# Patient Record
Sex: Female | Born: 1985 | Race: White | Hispanic: No | Marital: Single | Smoking: Former smoker
Health system: Southern US, Community
[De-identification: ages and names within clinical notes are randomized; demographics above are authoritative.]

## PROBLEM LIST (undated history)

## (undated) ENCOUNTER — Inpatient Hospital Stay (HOSPITAL_COMMUNITY): Payer: Self-pay

## (undated) DIAGNOSIS — K219 Gastro-esophageal reflux disease without esophagitis: Secondary | ICD-10-CM

## (undated) DIAGNOSIS — A749 Chlamydial infection, unspecified: Secondary | ICD-10-CM

## (undated) DIAGNOSIS — F319 Bipolar disorder, unspecified: Secondary | ICD-10-CM

## (undated) DIAGNOSIS — F419 Anxiety disorder, unspecified: Secondary | ICD-10-CM

## (undated) DIAGNOSIS — R519 Headache, unspecified: Secondary | ICD-10-CM

## (undated) DIAGNOSIS — R51 Headache: Secondary | ICD-10-CM

## (undated) HISTORY — DX: Anxiety disorder, unspecified: F41.9

## (undated) HISTORY — PX: TUBAL LIGATION: SHX77

## (undated) HISTORY — DX: Bipolar disorder, unspecified: F31.9

## (undated) HISTORY — PX: APPENDECTOMY: SHX54

## (undated) HISTORY — PX: WISDOM TOOTH EXTRACTION: SHX21

## (undated) HISTORY — PX: CHOLECYSTECTOMY: SHX55

## (undated) HISTORY — DX: Chlamydial infection, unspecified: A74.9

---

## 2005-11-18 ENCOUNTER — Emergency Department: Payer: Self-pay | Admitting: Emergency Medicine

## 2006-07-02 ENCOUNTER — Emergency Department: Payer: Self-pay | Admitting: Emergency Medicine

## 2007-03-27 ENCOUNTER — Emergency Department: Payer: Self-pay | Admitting: Emergency Medicine

## 2007-06-30 ENCOUNTER — Emergency Department: Payer: Self-pay | Admitting: Emergency Medicine

## 2007-08-17 ENCOUNTER — Emergency Department: Payer: Self-pay | Admitting: Emergency Medicine

## 2008-01-23 ENCOUNTER — Emergency Department (HOSPITAL_COMMUNITY): Admission: EM | Admit: 2008-01-23 | Discharge: 2008-01-23 | Payer: Self-pay | Admitting: Emergency Medicine

## 2008-01-24 ENCOUNTER — Emergency Department: Payer: Self-pay | Admitting: Emergency Medicine

## 2008-01-27 ENCOUNTER — Ambulatory Visit: Payer: Self-pay | Admitting: Surgery

## 2008-05-24 ENCOUNTER — Emergency Department: Payer: Self-pay | Admitting: Emergency Medicine

## 2009-01-24 ENCOUNTER — Emergency Department: Payer: Self-pay | Admitting: Emergency Medicine

## 2009-08-17 ENCOUNTER — Ambulatory Visit: Payer: Self-pay | Admitting: Family Medicine

## 2010-09-05 ENCOUNTER — Emergency Department: Payer: Self-pay

## 2010-09-21 ENCOUNTER — Emergency Department (HOSPITAL_COMMUNITY): Admission: EM | Admit: 2010-09-21 | Discharge: 2009-11-24 | Payer: Self-pay | Admitting: Emergency Medicine

## 2011-07-10 LAB — COMPREHENSIVE METABOLIC PANEL
ALT: 22
AST: 24
Albumin: 4
Alkaline Phosphatase: 51
CO2: 25
Calcium: 9.1
Creatinine, Ser: 0.75
GFR calc non Af Amer: >60
Glucose, Bld: 104 — ABNORMAL HIGH
Total Bilirubin: 0.9
Total Protein: 6.7

## 2011-07-10 LAB — URINALYSIS, ROUTINE W REFLEX MICROSCOPIC
Glucose, UA: NEGATIVE
Hgb urine dipstick: NEGATIVE
Protein, ur: NEGATIVE
Specific Gravity, Urine: 1.018
Urobilinogen, UA: 1

## 2011-07-10 LAB — CBC
HCT: 39
Hemoglobin: 13.9
MCV: 87.1
Platelets: 253
RDW: 13
WBC: 8.6

## 2011-07-10 LAB — RAPID URINE DRUG SCREEN, HOSP PERFORMED
Amphetamines: NOT DETECTED
Benzodiazepines: NOT DETECTED
Cocaine: NOT DETECTED
Opiates: NOT DETECTED

## 2011-07-10 LAB — DIFFERENTIAL
Basophils Absolute: 0
Lymphs Abs: 1.8
Monocytes Absolute: 0.6
Neutro Abs: 6.1

## 2012-09-18 ENCOUNTER — Emergency Department: Payer: Self-pay | Admitting: Emergency Medicine

## 2013-06-08 ENCOUNTER — Ambulatory Visit (INDEPENDENT_AMBULATORY_CARE_PROVIDER_SITE_OTHER): Payer: Self-pay | Admitting: *Deleted

## 2013-06-08 ENCOUNTER — Encounter: Payer: Self-pay | Admitting: *Deleted

## 2013-06-08 VITALS — BP 123/88 | Wt 226.0 lb

## 2013-06-08 DIAGNOSIS — Z3481 Encounter for supervision of other normal pregnancy, first trimester: Secondary | ICD-10-CM

## 2013-06-08 DIAGNOSIS — Z3480 Encounter for supervision of other normal pregnancy, unspecified trimester: Secondary | ICD-10-CM

## 2013-06-08 NOTE — Progress Notes (Signed)
[id number] 

## 2013-06-08 NOTE — Progress Notes (Signed)
Informal bedside ultrasound done showing CRL of 7weeks 1 day, intrauterine pregnancy with positive fetal heart rate.  Patient is reassured.

## 2013-06-09 LAB — OBSTETRIC PANEL
Antibody Screen: NEGATIVE
Basophils Absolute: 0 K/uL (ref 0.0–0.1)
Basophils Relative: 0 % (ref 0–1)
Eosinophils Absolute: 0.2 K/uL (ref 0.0–0.7)
Eosinophils Relative: 2 % (ref 0–5)
HCT: 41.1 % (ref 36.0–46.0)
Hemoglobin: 13.8 g/dL (ref 12.0–15.0)
Hepatitis B Surface Ag: NEGATIVE
Lymphocytes Relative: 19 % (ref 12–46)
Lymphs Abs: 2.4 K/uL (ref 0.7–4.0)
MCH: 29.7 pg (ref 26.0–34.0)
MCHC: 33.6 g/dL (ref 30.0–36.0)
MCV: 88.4 fL (ref 78.0–100.0)
Monocytes Absolute: 0.5 K/uL (ref 0.1–1.0)
Monocytes Relative: 4 % (ref 3–12)
Neutro Abs: 9.2 K/uL — ABNORMAL HIGH (ref 1.7–7.7)
Neutrophils Relative %: 75 % (ref 43–77)
Platelets: 251 K/uL (ref 150–400)
RBC: 4.65 MIL/uL (ref 3.87–5.11)
RDW: 13.2 % (ref 11.5–15.5)
Rh Type: POSITIVE
Rubella: 2.32 {index} — ABNORMAL HIGH
WBC: 12.4 K/uL — ABNORMAL HIGH (ref 4.0–10.5)

## 2013-06-09 LAB — GC/CHLAMYDIA PROBE AMP, URINE
Chlamydia, Swab/Urine, PCR: NEGATIVE
GC Probe Amp, Urine: NEGATIVE

## 2013-06-09 LAB — HIV ANTIBODY (ROUTINE TESTING W REFLEX): HIV: NONREACTIVE

## 2013-06-10 LAB — CULTURE, OB URINE: Organism ID, Bacteria: NO GROWTH

## 2013-06-10 LAB — CYSTIC FIBROSIS DIAGNOSTIC STUDY

## 2013-06-25 ENCOUNTER — Encounter: Payer: Self-pay | Admitting: Obstetrics and Gynecology

## 2013-06-25 ENCOUNTER — Ambulatory Visit (INDEPENDENT_AMBULATORY_CARE_PROVIDER_SITE_OTHER): Payer: Self-pay | Admitting: Obstetrics and Gynecology

## 2013-06-25 VITALS — BP 117/87 | Ht 66.0 in | Wt 227.0 lb

## 2013-06-25 DIAGNOSIS — Z124 Encounter for screening for malignant neoplasm of cervix: Secondary | ICD-10-CM

## 2013-06-25 DIAGNOSIS — O99340 Other mental disorders complicating pregnancy, unspecified trimester: Secondary | ICD-10-CM

## 2013-06-25 DIAGNOSIS — O99334 Smoking (tobacco) complicating childbirth: Secondary | ICD-10-CM

## 2013-06-25 DIAGNOSIS — F319 Bipolar disorder, unspecified: Secondary | ICD-10-CM

## 2013-06-25 DIAGNOSIS — O99330 Smoking (tobacco) complicating pregnancy, unspecified trimester: Secondary | ICD-10-CM | POA: Insufficient documentation

## 2013-06-25 DIAGNOSIS — O99331 Smoking (tobacco) complicating pregnancy, first trimester: Secondary | ICD-10-CM

## 2013-06-25 DIAGNOSIS — O3421 Maternal care for scar from previous cesarean delivery: Secondary | ICD-10-CM

## 2013-06-25 DIAGNOSIS — Z3480 Encounter for supervision of other normal pregnancy, unspecified trimester: Secondary | ICD-10-CM | POA: Insufficient documentation

## 2013-06-25 DIAGNOSIS — O34219 Maternal care for unspecified type scar from previous cesarean delivery: Secondary | ICD-10-CM | POA: Insufficient documentation

## 2013-06-25 DIAGNOSIS — Z113 Encounter for screening for infections with a predominantly sexual mode of transmission: Secondary | ICD-10-CM

## 2013-06-25 DIAGNOSIS — Z3481 Encounter for supervision of other normal pregnancy, first trimester: Secondary | ICD-10-CM

## 2013-06-25 MED ORDER — DOCUSATE SODIUM 100 MG PO CAPS: 100.0000 mg | ORAL_CAPSULE | Freq: Two times a day (BID) | ORAL | Status: DC | PRN

## 2013-06-25 MED ORDER — PROMETHAZINE HCL 25 MG PO TABS: 25.0000 mg | ORAL_TABLET | Freq: Four times a day (QID) | ORAL | Status: DC | PRN

## 2013-06-25 NOTE — Patient Instructions (Signed)
Pregnancy - First Trimester During sexual intercourse, millions of sperm go into the vagina. Only 1 sperm will penetrate and fertilize the female egg while it is in the Fallopian tube. One week later, the fertilized egg implants into the wall of the uterus. An embryo begins to develop into a baby. At 6 to 8 weeks, the eyes and face are formed and the heartbeat can be seen on ultrasound. At the end of 12 weeks (first trimester), all the baby's organs are formed. Now that you are pregnant, you will want to do everything you can to have a healthy baby. Two of the most important things are to get good prenatal care and follow your caregiver's instructions. Prenatal care is all the medical care you receive before the baby's birth. It is given to prevent, find, and treat problems during the pregnancy and childbirth. PRENATAL EXAMS  During prenatal visits, your weight, blood pressure, and urine are checked. This is done to make sure you are healthy and progressing normally during the pregnancy.  A pregnant woman should gain 25 to 35 pounds during the pregnancy. However, if you are overweight or underweight, your caregiver will advise you regarding your weight.  Your caregiver will ask and answer questions for you.  Blood work, cervical cultures, other necessary tests, and a Pap test are done during your prenatal exams. These tests are done to check on your health and the probable health of your baby. Tests are strongly recommended and done for HIV with your permission. This is the virus that causes AIDS. These tests are done because medicines can be given to help prevent your baby from being born with this infection should you have been infected without knowing it. Blood work is also used to find out your blood type, previous infections, and follow your blood levels (hemoglobin).  Low hemoglobin (anemia) is common during pregnancy. Iron and vitamins are given to help prevent this. Later in the pregnancy,  blood tests for diabetes will be done along with any other tests if any problems develop.  You may need other tests to make sure you and the baby are doing well. CHANGES DURING THE FIRST TRIMESTER  Your body goes through many changes during pregnancy. They vary from person to person. Talk to your caregiver about changes you notice and are concerned about. Changes can include:  Your menstrual period stops.  The egg and sperm carry the genes that determine what you look like. Genes from you and your partner are forming a baby. The female genes determine whether the baby is a boy or a girl.  Your body increases in girth and you may feel bloated.  Feeling sick to your stomach (nauseous) and throwing up (vomiting). If the vomiting is uncontrollable, call your caregiver.  Your breasts will begin to enlarge and become tender.  Your nipples may stick out more and become darker.  The need to urinate more. Painful urination may mean you have a bladder infection.  Tiring easily.  Loss of appetite.  Cravings for certain kinds of food.  At first, you may gain or lose a couple of pounds.  You may have changes in your emotions from day to day (excited to be pregnant or concerned something may go wrong with the pregnancy and baby).  You may have more vivid and strange dreams. HOME CARE INSTRUCTIONS   It is very important to avoid all smoking, alcohol and non-prescribed drugs during your pregnancy. These affect the formation and growth of the baby.   Avoid chemicals while pregnant to ensure the delivery of a healthy infant.  Start your prenatal visits by the 12th week of pregnancy. They are usually scheduled monthly at first, then more often in the last 2 months before delivery. Keep your caregiver's appointments. Follow your caregiver's instructions regarding medicine use, blood and lab tests, exercise, and diet.  During pregnancy, you are providing food for you and your baby. Eat regular,  well-balanced meals. Choose foods such as meat, fish, milk and other low fat dairy products, vegetables, fruits, and whole-grain breads and cereals. Your caregiver will tell you of the ideal weight gain.  You can help morning sickness by keeping soda crackers at the bedside. Eat a couple before arising in the morning. You may want to use the crackers without salt on them.  Eating 4 to 5 small meals rather than 3 large meals a day also may help the nausea and vomiting.  Drinking liquids between meals instead of during meals also seems to help nausea and vomiting.  A physical sexual relationship may be continued throughout pregnancy if there are no other problems. Problems may be early (premature) leaking of amniotic fluid from the membranes, vaginal bleeding, or belly (abdominal) pain.  Exercise regularly if there are no restrictions. Check with your caregiver or physical therapist if you are unsure of the safety of some of your exercises. Greater weight gain will occur in the last 2 trimesters of pregnancy. Exercising will help:  Control your weight.  Keep you in shape.  Prepare you for labor and delivery.  Help you lose your pregnancy weight after you deliver your baby.  Wear a good support or jogging bra for breast tenderness during pregnancy. This may help if worn during sleep too.  Ask when prenatal classes are available. Begin classes when they are offered.  Do not use hot tubs, steam rooms, or saunas.  Wear your seat belt when driving. This protects you and your baby if you are in an accident.  Avoid raw meat, uncooked cheese, cat litter boxes, and soil used by cats throughout the pregnancy. These carry germs that can cause birth defects in the baby.  The first trimester is a good time to visit your dentist for your dental health. Getting your teeth cleaned is okay. Use a softer toothbrush and brush gently during pregnancy.  Ask for help if you have financial, counseling, or  nutritional needs during pregnancy. Your caregiver will be able to offer counseling for these needs as well as refer you for other special needs.  Do not take any medicines or herbs unless told by your caregiver.  Inform your caregiver if there is any mental or physical domestic violence.  Make a list of emergency phone numbers of family, friends, hospital, and police and fire departments.  Write down your questions. Take them to your prenatal visit.  Do not douche.  Do not cross your legs.  If you have to stand for long periods of time, rotate you feet or take small steps in a circle.  You may have more vaginal secretions that may require a sanitary pad. Do not use tampons or scented sanitary pads. MEDICINES AND DRUG USE IN PREGNANCY  Take prenatal vitamins as directed. The vitamin should contain 1 milligram of folic acid. Keep all vitamins out of reach of children. Only a couple vitamins or tablets containing iron may be fatal to a baby or young child when ingested.  Avoid use of all medicines, including herbs, over-the-counter medicines, not   prescribed or suggested by your caregiver. Only take over-the-counter or prescription medicines for pain, discomfort, or fever as directed by your caregiver. Do not use aspirin, ibuprofen, or naproxen unless directed by your caregiver.  Let your caregiver also know about herbs you may be using.  Alcohol is related to a number of birth defects. This includes fetal alcohol syndrome. All alcohol, in any form, should be avoided completely. Smoking will cause low birth rate and premature babies.  Street or illegal drugs are very harmful to the baby. They are absolutely forbidden. A baby born to an addicted mother will be addicted at birth. The baby will go through the same withdrawal an adult does.  Let your caregiver know about any medicines that you have to take and for what reason you take them. SEEK MEDICAL CARE IF:  You have any concerns or  worries during your pregnancy. It is better to call with your questions if you feel they cannot wait, rather than worry about them. SEEK IMMEDIATE MEDICAL CARE IF:   An unexplained oral temperature above 102 F (38.9 C) develops, or as your caregiver suggests.  You have leaking of fluid from the vagina (birth canal). If leaking membranes are suspected, take your temperature and inform your caregiver of this when you call.  There is vaginal spotting or bleeding. Notify your caregiver of the amount and how many pads are used.  You develop a bad smelling vaginal discharge with a change in the color.  You continue to feel sick to your stomach (nauseated) and have no relief from remedies suggested. You vomit blood or coffee ground-like materials.  You lose more than 2 pounds of weight in 1 week.  You gain more than 2 pounds of weight in 1 week and you notice swelling of your face, hands, feet, or legs.  You gain 5 pounds or more in 1 week (even if you do not have swelling of your hands, face, legs, or feet).  You get exposed to [place] measles and have never had them.  You are exposed to fifth disease or chickenpox.  You develop belly (abdominal) pain. Round ligament discomfort is a common non-cancerous (benign) cause of abdominal pain in pregnancy. Your caregiver still must evaluate this.  You develop headache, fever, diarrhea, pain with urination, or shortness of breath.  You fall or are in a car accident or have any kind of trauma.  There is mental or physical violence in your home. Document Released: 09/25/2001 Document Revised: 06/25/2012 Document Reviewed: 03/29/2009 [facility] Patient Information 2014 [place], [place].  Pregnancy and Smoking Smoking during pregnancy is very unhealthy for the mother and the developing fetus. The addictive drug in cigarettes (nicotine), carbon monoxide, and many other poisons are inhaled from a cigarette and are carried through your bloodstream to your  fetus. Cigarette smoke contains more than 2,500 chemicals. It is not known which of these chemicals are harmful to the developing fetus. However, both nicotine and carbon monoxide play a role in causing health problems in pregnancy. Effects on the fetus of smoking during pregnancy:  Decrease in blood flow and oxygen to the uterus, placenta, and your fetus.  Increased heart rate of the fetus.  Slowing of your fetus's growth in the uterus (intrauterine growth retardation).  Placental problems. Placenta may partially cover or completely cover the opening to the cervix (placenta previa), or the placenta may partially or completely separate from the uterus (placental abruption).  Increase risk of pregnancy outside of the uterus (tubal pregnancy).  Premature rupture membranes, causing the sac that holds the fetus to break too early, resulting in premature birth and increased health risks to the newborn.  Increased risk of birth defects, including heart defects.  Increased risk of miscarriage. Newborns born to women who smoke during pregnancy:  Are more likely to be born too early (prematurely).  Are more likely to be at a low birth weight.  Are at risk for serious health problems, chronic or lifelong disabilities (cerebral palsy, mental retardation, learning problems), and possibly even death  Are at risk of Sudden Infant Death Syndrome (SIDS).  Have higher rates of miscarriage and stillbirth.  Have more lung and breathing (respiratory) problems. Long-term effects on a child's behavior: Some of the following trends are seen with children of smoking mothers:  Increased risk for drug abuse, behavior, and conduct disorders.  Increased risk for smoking in adolescent girls.  Increased risk for negative behavior in 2-year-olds.  Increase risk for asthma, colic, and childhood obesity, which can lead to diabetes.  Increased risk for finger and toe disorders. Resources to stop smoking  during pregnancy:  Counseling.  Psychological treatment.  Acupuncture.  Family intervention.  Hypnosis.  Medicines that are safe to take during pregnancy. Nicotine supplements have not been studied enough. They should only be considered when all other methods fail.  Telephone QUIT lines. Smoking and Breastfeeding:  Nicotine gets passed to the infant through a mother's breastmilk. This can cause nausea, colic, cramping, and diarrhea in the infant.  Smoking may reduce milk supply and interfere with the let-down response.  Even formula-fed infants of mothers who smoke have nicotine and cotinine (nicotine by-product) in their urine. Other resources to help stop smoking:  American Cancer Society: www.cancer.org  American Heart Association: www.americanheart.org  National Cancer Institute: www.cancer.gov  Smoke Free Families: www.smokefreefamilies.[address] [place]): 601-771-8631 START Document Released: 02/12/2005 Document Revised: 12/24/2011 Document Reviewed: 07/13/2009 [facility] Patient Information 2014 [place], [place].  Contraception Choices Contraception (birth control) is the use of any methods or devices to prevent pregnancy. Below are some methods to help avoid pregnancy. HORMONAL METHODS   Contraceptive implant. This is a thin, plastic tube containing progesterone hormone. It does not contain estrogen hormone. Your caregiver inserts the tube in the inner part of the upper arm. The tube can remain in place for up to 3 years. After 3 years, the implant must be removed. The implant prevents the ovaries from releasing an egg (ovulation), thickens the cervical mucus which prevents sperm from entering the uterus, and thins the lining of the inside of the uterus.  Progesterone-only injections. These injections are given every 3 months by your caregiver to prevent pregnancy. This synthetic progesterone hormone stops the ovaries from releasing eggs. It also thickens cervical  mucus and changes the uterine lining. This makes it harder for sperm to survive in the uterus.  Birth control pills. These pills contain estrogen and progesterone hormone. They work by stopping the egg from forming in the ovary (ovulation). Birth control pills are prescribed by a caregiver.Birth control pills can also be used to treat heavy periods.  Minipill. This type of birth control pill contains only the progesterone hormone. They are taken every day of each month and must be prescribed by your caregiver.  Birth control patch. The patch contains hormones similar to those in birth control pills. It must be changed once a week and is prescribed by a caregiver.  Vaginal ring. The ring contains hormones similar to those in birth control  pills. It is left in the vagina for 3 weeks, removed for 1 week, and then a new one is put back in place. The patient must be comfortable inserting and removing the ring from the vagina.A caregiver's prescription is necessary.  Emergency contraception. Emergency contraceptives prevent pregnancy after unprotected sexual intercourse. This pill can be taken right after sex or up to 5 days after unprotected sex. It is most effective the sooner you take the pills after having sexual intercourse. Emergency contraceptive pills are available without a prescription. Check with your pharmacist. Do not use emergency contraception as your only form of birth control. BARRIER METHODS   Female condom. This is a thin sheath (latex or rubber) that is worn over the penis during sexual intercourse. It can be used with spermicide to increase effectiveness.  Female condom. This is a soft, loose-fitting sheath that is put into the vagina before sexual intercourse.  Diaphragm. This is a soft, latex, dome-shaped barrier that must be fitted by a caregiver. It is inserted into the vagina, along with a spermicidal jelly. It is inserted before intercourse. The diaphragm should be left in the  vagina for 6 to 8 hours after intercourse.  Cervical cap. This is a round, soft, latex or plastic cup that fits over the cervix and must be fitted by a caregiver. The cap can be left in place for up to 48 hours after intercourse.  Sponge. This is a soft, circular piece of polyurethane foam. The sponge has spermicide in it. It is inserted into the vagina after wetting it and before sexual intercourse.  Spermicides. These are chemicals that kill or block sperm from entering the cervix and uterus. They come in the form of creams, jellies, suppositories, foam, or tablets. They do not require a prescription. They are inserted into the vagina with an applicator before having sexual intercourse. The process must be repeated every time you have sexual intercourse. INTRAUTERINE CONTRACEPTION  Intrauterine device (IUD). This is a T-shaped device that is put in a woman's uterus during a menstrual period to prevent pregnancy. There are 2 types:  Copper IUD. This type of IUD is wrapped in copper wire and is placed inside the uterus. Copper makes the uterus and fallopian tubes produce a fluid that kills sperm. It can stay in place for 10 years.  Hormone IUD. This type of IUD contains the hormone progestin (synthetic progesterone). The hormone thickens the cervical mucus and prevents sperm from entering the uterus, and it also thins the uterine lining to prevent implantation of a fertilized egg. The hormone can weaken or kill the sperm that get into the uterus. It can stay in place for 5 years. PERMANENT METHODS OF CONTRACEPTION  Female tubal ligation. This is when the woman's fallopian tubes are surgically sealed, tied, or blocked to prevent the egg from traveling to the uterus.  Female sterilization. This is when the female has the tubes that carry sperm tied off (vasectomy).This blocks sperm from entering the vagina during sexual intercourse. After the procedure, the man can still ejaculate fluid  (semen). NATURAL PLANNING METHODS  Natural family planning. This is not having sexual intercourse or using a barrier method (condom, diaphragm, cervical cap) on days the woman could become pregnant.  Calendar method. This is keeping track of the length of each menstrual cycle and identifying when you are fertile.  Ovulation method. This is avoiding sexual intercourse during ovulation.  Symptothermal method. This is avoiding sexual intercourse during ovulation, using a thermometer and ovulation  symptoms.  Post-ovulation method. This is timing sexual intercourse after you have ovulated. Regardless of which type or method of contraception you choose, it is important that you use condoms to protect against the transmission of sexually transmitted diseases (STDs). Talk with your caregiver about which form of contraception is most appropriate for you. Document Released: 10/01/2005 Document Revised: 12/24/2011 Document Reviewed: 02/07/2011 [facility] Patient Information 2014 [place], [place].  Breastfeeding A change in hormones during your pregnancy causes growth of your breast tissue and an increase in number and size of milk ducts. The hormone prolactin allows proteins, sugars, and fats from your blood supply to make breast milk in your milk-producing glands. The hormone progesterone prevents breast milk from being released before the birth of your baby. After the birth of your baby, your progesterone level decreases allowing breast milk to be released. Thoughts of your baby, as well as his or her sucking or crying, can stimulate the release of milk from the milk-producing glands. Deciding to breastfeed (nurse) is one of the best choices you can make for you and your baby. The information that follows gives a brief review of the benefits, as well as other important skills to know about breastfeeding. BENEFITS OF BREASTFEEDING For your baby  The first milk (colostrum) helps your baby's digestive system  function better.   There are antibodies in your milk that help your baby fight off infections.   Your baby has a lower incidence of asthma, allergies, and sudden infant death syndrome (SIDS).   The nutrients in breast milk are better for your baby than infant formulas.  Breast milk improves your baby's brain development.   Your baby will have less gas, colic, and constipation.  Your baby is less likely to develop other conditions, such as childhood obesity, asthma, or diabetes mellitus. For you  Breastfeeding helps develop a very special bond between you and your baby.   Breastfeeding is convenient, always available at the correct temperature, and costs nothing.   Breastfeeding helps to burn calories and helps you lose the weight gained during pregnancy.   Breastfeeding makes your uterus contract back down to normal size faster and slows bleeding following delivery.   Breastfeeding mothers have a lower risk of developing osteoporosis or breast or ovarian cancer later in life.  BREASTFEEDING FREQUENCY  A healthy, full-term baby may breastfeed as often as every hour or space his or her feedings to every 3 hours. Breastfeeding frequency will vary from baby to baby.   Newborns should be fed no less than every 2 3 hours during the day and every 4 5 hours during the night. You should breastfeed a minimum of 8 feedings in a 24 hour period.  Awaken your baby to breastfeed if it has been 3 4 hours since the last feeding.  Breastfeed when you feel the need to reduce the fullness of your breasts or when your newborn shows signs of hunger. Signs that your baby may be hungry include:  Increased alertness or activity.  Stretching.  Movement of the head from side to side.  Movement of the head and opening of the mouth when the corner of the mouth or cheek is stroked (rooting).  Increased sucking sounds, smacking lips, cooing, sighing, or squeaking.  Hand-to-mouth  movements.  Increased sucking of fingers or hands.  Fussing.  Intermittent crying.  Signs of extreme hunger will require calming and consoling before you try to feed your baby. Signs of extreme hunger may include:  Restlessness.  A loud,  strong cry.  Screaming.  Frequent feeding will help you make more milk and will help prevent problems, such as sore nipples and engorgement of the breasts.  BREASTFEEDING   Whether lying down or sitting, be sure that the baby's abdomen is facing your abdomen.   Support your breast with 4 fingers under your breast and your thumb above your nipple. Make sure your fingers are well away from your nipple and your baby's mouth.   Stroke your baby's lips gently with your finger or nipple.   When your baby's mouth is open wide enough, place all of your nipple and as much of the colored area around your nipple (areola) as possible into your baby's mouth.  More areola should be visible above his or her upper lip than below his or her lower lip.  Your baby's tongue should be between his or her lower gum and your breast.  Ensure that your baby's mouth is correctly positioned around the nipple (latched). Your baby's lips should create a seal on your breast.  Signs that your baby has effectively latched onto your nipple include:  Tugging or sucking without pain.  Swallowing heard between sucks.  Absent click or smacking sound.  Muscle movement above and in front of his or her ears with sucking.  Your baby must suck about 2 3 minutes in order to get your milk. Allow your baby to feed on each breast as long as he or she wants. Nurse your baby until he or she unlatches or falls asleep at the first breast, then offer the second breast.  Signs that your baby is full and satisfied include:  A gradual decrease in the number of sucks or complete cessation of sucking.  Falling asleep.  Extension or relaxation of his or her body.  Retention of a  small amount of milk in his or her mouth.  Letting go of your breast by himself or herself.  Signs of effective breastfeeding in you include:  Breasts that have increased firmness, weight, and size prior to feeding.  Breasts that are softer after nursing.  Increased milk volume, as well as a change in milk consistency and color by the 5th day of breastfeeding.  Breast fullness relieved by breastfeeding.  Nipples are not sore, cracked, or bleeding.  If needed, break the suction by putting your finger into the corner of your baby's mouth and sliding your finger between his or her gums. Then, remove your breast from his or her mouth.  It is common for babies to spit up a small amount after a feeding.  Babies often swallow air during feeding. This can make babies fussy. Burping your baby between breasts can help with this.  Vitamin D supplements are recommended for babies who get only breast milk.  Avoid using a pacifier during your baby's first 4 6 weeks.  Avoid supplemental feedings of water, formula, or juice in place of breastfeeding. Breast milk is all the food your baby needs. It is not necessary for your baby to have water or formula. Your breasts will make more milk if supplemental feedings are avoided during the early weeks. HOW TO TELL WHETHER YOUR BABY IS GETTING ENOUGH BREAST MILK Wondering whether or not your baby is getting enough milk is a common concern among mothers. You can be assured that your baby is getting enough milk if:   Your baby is actively sucking and you hear swallowing.   Your baby seems relaxed and satisfied after a feeding.  Your baby nurses at least 8 12 times in a 24 hour time period.  During the first 20 25 days of age:  Your baby is wetting at least 3 5 diapers in a 24 hour period. The urine should be clear and pale yellow.  Your baby is having at least 3 4 stools in a 24 hour period. The stool should be soft and yellow.  At 56 84 days of age,  your baby is having at least 3 6 stools in a 24 hour period. The stool should be seedy and yellow by 40 days of age.  Your baby has a weight loss less than 7 10% during the first 51 days of age.  Your baby does not lose weight after 67 67 days of age.  Your baby gains 4 7 ounces each week after he or she is 38 days of age.  Your baby gains weight by 69 days of age and is back to birth weight within 2 weeks. ENGORGEMENT In the first week after your baby is born, you may experience extremely full breasts (engorgement). When engorged, your breasts may feel heavy, warm, or tender to the touch. Engorgement peaks within 24 48 hours after delivery of your baby.  Engorgement may be reduced by:  Continuing to breastfeed.  Increasing the frequency of breastfeeding.  Taking warm showers or applying warm, moist heat to your breasts just before each feeding. This increases circulation and helps the milk flow.   Gently massaging your breast before and during the feedings. With your fingertips, massage from your chest wall towards your nipple in a circular motion.   Ensuring that your baby empties at least one breast at every feeding. It also helps to start the next feeding on the opposite breast.   Expressing breast milk by hand or by using a breast pump to empty the breasts if your baby is sleepy, or not nursing well. You may also want to express milk if you are returning to work oryou feel you are getting engorged.  Ensuring your baby is latched on and positioned properly while breastfeeding. If you follow these suggestions, your engorgement should improve in 24 48 hours. If you are still experiencing difficulty, call your lactation consultant or caregiver.  CARING FOR YOURSELF Take care of your breasts.  Bathe or shower daily.   Avoid using soap on your nipples.   Wear a supportive bra. Avoid wearing underwire style bras.  Air dry your nipples for a 3 after each feeding.   Use  only cotton bra pads to absorb breast milk leakage. Leaking of breast milk between feedings is normal.   Use only pure lanolin on your nipples after nursing. You do not need to wash it off before feeding your baby again. Another option is to express a few drops of breast milk and gently massage that milk into your nipples.  Continue breast self-awareness checks. Take care of yourself.  Eat healthy foods. Alternate 3 meals with 3 snacks.  Avoid foods that you notice affect your baby in a bad way.  Drink milk, fruit juice, and water to satisfy your thirst (about 8 glasses a day).   Rest often, relax, and take your prenatal vitamins to prevent fatigue, stress, and anemia.  Avoid chewing and smoking tobacco.  Avoid alcohol and drug use.  Take over-the-counter and prescribed medicine only as directed by your caregiver or pharmacist. You should always check with your caregiver or pharmacist before taking any new medicine, vitamin, or  herbal supplement.  Know that pregnancy is possible while breastfeeding. If desired, talk to your caregiver about family planning and safe birth control methods that may be used while breastfeeding. SEEK MEDICAL CARE IF:   You feel like you want to stop breastfeeding or have become frustrated with breastfeeding.  You have painful breasts or nipples.  Your nipples are cracked or bleeding.  Your breasts are red, tender, or warm.  You have a swollen area on either breast.  You have a fever or chills.  You have nausea or vomiting.  You have drainage from your nipples.  Your breasts do not become full before feedings by the 5th day after delivery.  You feel sad and depressed.  Your baby is too sleepy to eat well.  Your baby is having trouble sleeping.   Your baby is wetting less than 3 diapers in a 24 hour period.  Your baby has less than 3 stools in a 24 hour period.  Your baby's skin or the white part of his or her eyes becomes more yellow.    Your baby is not gaining weight by 62 days of age. MAKE SURE YOU:   Understand these instructions.  Will watch your condition.  Will get help right away if you are not doing well or get worse. Document Released: 10/01/2005 Document Revised: 06/25/2012 Document Reviewed: 05/07/2012 [facility] Patient Information 2014 [place], [place].

## 2013-06-25 NOTE — Progress Notes (Signed)
   Subjective:    Joan Heath is a G2P1001 [redacted]w[redacted]d being seen today for her first obstetrical visit.  Her obstetrical history is significant for smoker and bipolar disorder and previous cesarean section. Patient does intend to breast feed. Pregnancy history fully reviewed.  Patient reports no complaints.  Filed Vitals:   06/25/13 0908  BP: 117/87  Weight: 227 lb (102.967 kg)    HISTORY: OB History  Gravida Para Term Preterm AB SAB TAB Ectopic Multiple Living  2 1 1       1     # Outcome Date GA Lbr Len/2nd Weight Sex Delivery Anes PTL Lv  2 CUR           1 TRM              Past Medical History  Diagnosis Date  . Anxiety   . Bipolar 1 disorder    Past Surgical History  Procedure Laterality Date  . Cesarean section  2007  . Appendectomy    . Cholecystectomy     No family history on file.   Exam    Uterus:     Pelvic Exam:    Perineum: Normal Perineum   Vulva: normal   Vagina:  normal mucosa, normal discharge   pH:    Cervix: closed and long   Adnexa: no mass, fullness, tenderness   Bony Pelvis: android  System: Breast:  normal appearance, no masses or tenderness   Skin: normal coloration and turgor, no rashes    Neurologic: oriented, no focal deficits   Extremities: normal strength, tone, and muscle mass   HEENT extra ocular movement intact   Mouth/Teeth mucous membranes moist, pharynx normal without lesions   Neck supple and no masses   Cardiovascular: regular rate and rhythm   Respiratory:  chest clear, no wheezing, crepitations, rhonchi, normal symmetric air entry   Abdomen: soft, non-tender; bowel sounds normal; no masses,  no organomegaly   Urinary:       Assessment:    Pregnancy: G2P1001 Patient Active Problem List   Diagnosis Date Noted  . Supervision of other normal pregnancy 06/25/2013  . Tobacco smoking complicating pregnancy 06/25/2013  . Bipolar disease in pregnancy 06/25/2013  . Bipolar disorder 06/25/2013        Plan:      Initial labs drawn. Prenatal vitamins. Problem list reviewed and updated. Genetic Screening discussed First Screen: requested.  Ultrasound discussed; fetal survey: requested. Discussed effects of smoking in pregnancy. Encouraged smoking cessation. Information provided to help quit Patient not taking medications for anxiety or bipolar disorder. Admits that her mood swings are out of control but does not like the way the medications makes her feel. Will refer to psychologist for support.  Follow up in 4 weeks. 50% of 30 min visit spent on counseling and coordination of care.     Tarence Searcy 06/25/2013

## 2013-06-25 NOTE — Progress Notes (Signed)
[id number]  

## 2013-07-08 ENCOUNTER — Telehealth: Payer: Self-pay | Admitting: *Deleted

## 2013-07-08 DIAGNOSIS — O219 Vomiting of pregnancy, unspecified: Secondary | ICD-10-CM

## 2013-07-08 MED ORDER — PROMETHAZINE HCL 25 MG PO TABS: 25.0000 mg | ORAL_TABLET | Freq: Four times a day (QID) | ORAL | Status: DC | PRN

## 2013-07-08 NOTE — Telephone Encounter (Signed)
Patient needs phenergan called into [facility] [address] for price reasons due to her insurance is not in effect yet.

## 2013-07-15 ENCOUNTER — Emergency Department: Payer: Self-pay | Admitting: Emergency Medicine

## 2013-07-15 LAB — URINALYSIS, COMPLETE
Bilirubin,UR: NEGATIVE
Nitrite: NEGATIVE
Specific Gravity: 1.026 (ref 1.003–1.030)
Squamous Epithelial: 6
WBC UR: 4 /HPF (ref 0–5)

## 2013-07-15 LAB — COMPREHENSIVE METABOLIC PANEL
Alkaline Phosphatase: 55 U/L (ref 50–136)
Anion Gap: 9 (ref 7–16)
BUN: 6 mg/dL — ABNORMAL LOW (ref 7–18)
Bilirubin,Total: 0.4 mg/dL (ref 0.2–1.0)
Calcium, Total: 9.2 mg/dL (ref 8.5–10.1)
Co2: 23 mmol/L (ref 21–32)
Glucose: 95 mg/dL (ref 65–99)
Osmolality: 268 (ref 275–301)
Sodium: 135 mmol/L — ABNORMAL LOW (ref 136–145)
Total Protein: 7.6 g/dL (ref 6.4–8.2)

## 2013-07-15 LAB — CBC
HCT: 39.1 % (ref 35.0–47.0)
MCH: 29.9 pg (ref 26.0–34.0)
MCV: 86 fL (ref 80–100)
Platelet: 242 10*3/uL (ref 150–440)
RBC: 4.54 10*6/uL (ref 3.80–5.20)

## 2013-07-15 LAB — HCG, QUANTITATIVE, PREGNANCY: Beta Hcg, Quant.: 43138 m[IU]/mL — ABNORMAL HIGH

## 2013-07-15 LAB — WET PREP, GENITAL

## 2013-07-15 LAB — GC/CHLAMYDIA PROBE AMP

## 2013-07-17 ENCOUNTER — Encounter: Payer: Self-pay | Admitting: Family Medicine

## 2013-07-17 ENCOUNTER — Ambulatory Visit (INDEPENDENT_AMBULATORY_CARE_PROVIDER_SITE_OTHER): Payer: Self-pay | Admitting: Family Medicine

## 2013-07-17 VITALS — BP 115/74 | Wt 224.0 lb

## 2013-07-17 DIAGNOSIS — Z3480 Encounter for supervision of other normal pregnancy, unspecified trimester: Secondary | ICD-10-CM

## 2013-07-17 DIAGNOSIS — Z3483 Encounter for supervision of other normal pregnancy, third trimester: Secondary | ICD-10-CM

## 2013-07-17 NOTE — Progress Notes (Signed)
Discussed meds with pt.  Previously on Geodon, Lamictal. Advised to see psychiatry. Needs to reschedule 1st trimester screen.

## 2013-07-17 NOTE — Patient Instructions (Addendum)
Morning Sickness Morning sickness is when you feel sick to your stomach (nauseous) during pregnancy. This nauseous feeling may or may not come with throwing up (vomiting). It often occurs in the morning, but can be a problem any time of day. While morning sickness is unpleasant, it is usually harmless unless you develop severe and continual vomiting (hyperemesis gravidarum). This condition requires more intense treatment. CAUSES  The cause of morning sickness is not completely known but seems to be related to a sudden increase of two hormones:   Human chorionic gonadotropin (hCG).  Estrogen hormone. These are elevated in the first part of the pregnancy. TREATMENT  Do not use any medicines (prescription, over-the-counter, or herbal) for morning sickness without first talking to your caregiver. Some patients are helped by the following:  Vitamin B6 (25mg  every 8 hours) or vitamin B6 shots.  An antihistamine called doxylamine (10mg  every 8 hours).  The herbal medication ginger. HOME CARE INSTRUCTIONS   Taking multivitamins before getting pregnant can prevent or decrease the severity of morning sickness in most women.  Eat a piece of dry toast or unsalted crackers before getting out of bed in the morning.  Eat 5 or 6 small meals a day.  Eat dry and bland foods (rice, baked potato).  Do not drink liquids with your meals. Drink liquids between meals.  Avoid greasy, fatty, and spicy foods.  Get someone to cook for you if the smell of any food causes nausea and vomiting.  Avoid vitamin pills with iron because iron can cause nausea.  Snack on protein foods between meals if you are hungry.  Eat unsweetened gelatins for deserts.  Wear an acupressure wristband (worn for sea sickness) may be helpful.  Acupuncture may be helpful.  Do not smoke.  Get a humidifier to keep the air in your house free of odors. SEEK MEDICAL CARE IF:   Your home remedies are not working and you need  medication.  You feel dizzy or lightheaded.  You are losing weight.  You need help with your diet. SEEK IMMEDIATE MEDICAL CARE IF:   You have persistent and uncontrolled nausea and vomiting.  You pass out (faint).  You have a fever. MAKE SURE YOU:   Understand these instructions.  Will watch your condition.  Will get help right away if you are not doing well or get worse. Document Released: 11/22/2006 Document Revised: 12/24/2011 Document Reviewed: 09/19/2007 [facility] Patient Information 2014 [place], [place].  Pregnancy - Second Trimester The second trimester of pregnancy (3 to 6 months) is a period of rapid growth for you and your baby. At the end of the sixth month, your baby is about 9 inches long and weighs 1 1/2 pounds. You will begin to feel the baby move between 18 and 20 weeks of the pregnancy. This is called quickening. Weight gain is faster. A clear fluid (colostrum) may leak out of your breasts. You may feel small contractions of the womb (uterus). This is known as false labor or Braxton-Hicks contractions. This is like a practice for labor when the baby is ready to be born. Usually, the problems with morning sickness have usually passed by the end of your first trimester. Some women develop small dark blotches (called cholasma, mask of pregnancy) on their face that usually goes away after the baby is born. Exposure to the sun makes the blotches worse. Acne may also develop in some pregnant women and pregnant women who have acne, may find that it goes away. PRENATAL EXAMS  Blood work may continue to be done during prenatal exams. These tests are done to check on your health and the probable health of your baby. Blood work is used to follow your blood levels (hemoglobin). Anemia (low hemoglobin) is common during pregnancy. Iron and vitamins are given to help prevent this. You will also be checked for diabetes between 24 and 28 weeks of the pregnancy. Some of the previous blood  tests may be repeated.  The size of the uterus is measured during each visit. This is to make sure that the baby is continuing to grow properly according to the dates of the pregnancy.  Your blood pressure is checked every prenatal visit. This is to make sure you are not getting toxemia.  Your urine is checked to make sure you do not have an infection, diabetes or protein in the urine.  Your weight is checked often to make sure gains are happening at the suggested rate. This is to ensure that both you and your baby are growing normally.  Sometimes, an ultrasound is performed to confirm the proper growth and development of the baby. This is a test which bounces harmless sound waves off the baby so your caregiver can more accurately determine due dates. Sometimes, a test is done on the amniotic fluid surrounding the baby. This test is called an amniocentesis. The amniotic fluid is obtained by sticking a needle into the belly (abdomen). This is done to check the chromosomes in instances where there is a concern about possible genetic problems with the baby. It is also sometimes done near the end of pregnancy if an early delivery is required. In this case, it is done to help make sure the baby's lungs are mature enough for the baby to live outside of the womb. CHANGES OCCURING IN THE SECOND TRIMESTER OF PREGNANCY Your body goes through many changes during pregnancy. They vary from person to person. Talk to your caregiver about changes you notice that you are concerned about.  During the second trimester, you will likely have an increase in your appetite. It is normal to have cravings for certain foods. This varies from person to person and pregnancy to pregnancy.  Your lower abdomen will begin to bulge.  You may have to urinate more often because the uterus and baby are pressing on your bladder. It is also common to get more bladder infections during pregnancy. You can help this by drinking lots of  fluids and emptying your bladder before and after intercourse.  You may begin to get stretch marks on your hips, abdomen, and breasts. These are normal changes in the body during pregnancy. There are no exercises or medicines to take that prevent this change.  You may begin to develop swollen and bulging veins (varicose veins) in your legs. Wearing support hose, elevating your feet for 15 minutes, 3 to 4 times a day and limiting salt in your diet helps lessen the problem.  Heartburn may develop as the uterus grows and pushes up against the stomach. Antacids recommended by your caregiver helps with this problem. Also, eating smaller meals 4 to 5 times a day helps.  Constipation can be treated with a stool softener or adding bulk to your diet. Drinking lots of fluids, and eating vegetables, fruits, and whole grains are helpful.  Exercising is also helpful. If you have been very active up until your pregnancy, most of these activities can be continued during your pregnancy. If you have been less active, it is helpful  to start an exercise program such as walking.  Hemorrhoids may develop at the end of the second trimester. Warm sitz baths and hemorrhoid cream recommended by your caregiver helps hemorrhoid problems.  Backaches may develop during this time of your pregnancy. Avoid heavy lifting, wear low heal shoes, and practice good posture to help with backache problems.  Some pregnant women develop tingling and numbness of their hand and fingers because of swelling and tightening of ligaments in the wrist (carpel tunnel syndrome). This goes away after the baby is born.  As your breasts enlarge, you may have to get a bigger bra. Get a comfortable, cotton, support bra. Do not get a nursing bra until the last month of the pregnancy if you will be nursing the baby.  You may get a dark line from your belly button to the pubic area called the linea nigra.  You may develop rosy cheeks because of increase  blood flow to the face.  You may develop spider looking lines of the face, neck, arms, and chest. These go away after the baby is born. HOME CARE INSTRUCTIONS   It is extremely important to avoid all smoking, herbs, alcohol, and unprescribed drugs during your pregnancy. These chemicals affect the formation and growth of the baby. Avoid these chemicals throughout the pregnancy to ensure the delivery of a healthy infant.  Most of your home care instructions are the same as suggested for the first trimester of your pregnancy. Keep your caregiver's appointments. Follow your caregiver's instructions regarding medicine use, exercise, and diet.  During pregnancy, you are providing food for you and your baby. Continue to eat regular, well-balanced meals. Choose foods such as meat, fish, milk and other low fat dairy products, vegetables, fruits, and whole-grain breads and cereals. Your caregiver will tell you of the ideal weight gain.  A physical sexual relationship may be continued up until near the end of pregnancy if there are no other problems. Problems could include early (premature) leaking of amniotic fluid from the membranes, vaginal bleeding, abdominal pain, or other medical or pregnancy problems.  Exercise regularly if there are no restrictions. Check with your caregiver if you are unsure of the safety of some of your exercises. The greatest weight gain will occur in the last 2 trimesters of pregnancy. Exercise will help you:  Control your weight.  Get you in shape for labor and delivery.  Lose weight after you have the baby.  Wear a good support or jogging bra for breast tenderness during pregnancy. This may help if worn during sleep. Pads or tissues may be used in the bra if you are leaking colostrum.  Do not use hot tubs, steam rooms or saunas throughout the pregnancy.  Wear your seat belt at all times when driving. This protects you and your baby if you are in an accident.  Avoid raw  meat, uncooked cheese, cat litter boxes, and soil used by cats. These carry germs that can cause birth defects in the baby.  The second trimester is also a good time to visit your dentist for your dental health if this has not been done yet. Getting your teeth cleaned is okay. Use a soft toothbrush. Brush gently during pregnancy.  It is easier to leak urine during pregnancy. Tightening up and strengthening the pelvic muscles will help with this problem. Practice stopping your urination while you are going to the bathroom. These are the same muscles you need to strengthen. It is also the muscles you would use  as if you were trying to stop from passing gas. You can practice tightening these muscles up 10 times a set and repeating this about 3 times per day. Once you know what muscles to tighten up, do not perform these exercises during urination. It is more likely to contribute to an infection by backing up the urine.  Ask for help if you have financial, counseling, or nutritional needs during pregnancy. Your caregiver will be able to offer counseling for these needs as well as refer you for other special needs.  Your skin may become oily. If so, wash your face with mild soap, use non-greasy moisturizer and oil or cream based makeup. MEDICINES AND DRUG USE IN PREGNANCY  Take prenatal vitamins as directed. The vitamin should contain 1 milligram of folic acid. Keep all vitamins out of reach of children. Only a couple vitamins or tablets containing iron may be fatal to a baby or young child when ingested.  Avoid use of all medicines, including herbs, over-the-counter medicines, not prescribed or suggested by your caregiver. Only take over-the-counter or prescription medicines for pain, discomfort, or fever as directed by your caregiver. Do not use aspirin.  Let your caregiver also know about herbs you may be using.  Alcohol is related to a number of birth defects. This includes fetal alcohol syndrome.  All alcohol, in any form, should be avoided completely. Smoking will cause low birth rate and premature babies.  Street or illegal drugs are very harmful to the baby. They are absolutely forbidden. A baby born to an addicted mother will be addicted at birth. The baby will go through the same withdrawal an adult does. SEEK MEDICAL CARE IF:  You have any concerns or worries during your pregnancy. It is better to call with your questions if you feel they cannot wait, rather than worry about them. SEEK IMMEDIATE MEDICAL CARE IF:   An unexplained oral temperature above 102 F (38.9 C) develops, or as your caregiver suggests.  You have leaking of fluid from the vagina (birth canal). If leaking membranes are suspected, take your temperature and tell your caregiver of this when you call.  There is vaginal spotting, bleeding, or passing clots. Tell your caregiver of the amount and how many pads are used. Light spotting in pregnancy is common, especially following intercourse.  You develop a bad smelling vaginal discharge with a change in the color from clear to white.  You continue to feel sick to your stomach (nauseated) and have no relief from remedies suggested. You vomit blood or coffee ground-like materials.  You lose more than 2 pounds of weight or gain more than 2 pounds of weight over 1 week, or as suggested by your caregiver.  You notice swelling of your face, hands, feet, or legs.  You get exposed to [place] measles and have never had them.  You are exposed to fifth disease or chickenpox.  You develop belly (abdominal) pain. Round ligament discomfort is a common non-cancerous (benign) cause of abdominal pain in pregnancy. Your caregiver still must evaluate you.  You develop a bad headache that does not go away.  You develop fever, diarrhea, pain with urination, or shortness of breath.  You develop visual problems, blurry, or double vision.  You fall or are in a car accident or any  kind of trauma.  There is mental or physical violence at home. Document Released: 09/25/2001 Document Revised: 06/25/2012 Document Reviewed: 03/30/2009 [facility] Patient Information 2014 [place], [place].  Breastfeeding A change in hormones  during your pregnancy causes growth of your breast tissue and an increase in number and size of milk ducts. The hormone prolactin allows proteins, sugars, and fats from your blood supply to make breast milk in your milk-producing glands. The hormone progesterone prevents breast milk from being released before the birth of your baby. After the birth of your baby, your progesterone level decreases allowing breast milk to be released. Thoughts of your baby, as well as his or her sucking or crying, can stimulate the release of milk from the milk-producing glands. Deciding to breastfeed (nurse) is one of the best choices you can make for you and your baby. The information that follows gives a brief review of the benefits, as well as other important skills to know about breastfeeding. BENEFITS OF BREASTFEEDING For your baby  The first milk (colostrum) helps your baby's digestive system function better.   There are antibodies in your milk that help your baby fight off infections.   Your baby has a lower incidence of asthma, allergies, and sudden infant death syndrome (SIDS).   The nutrients in breast milk are better for your baby than infant formulas.  Breast milk improves your baby's brain development.   Your baby will have less gas, colic, and constipation.  Your baby is less likely to develop other conditions, such as childhood obesity, asthma, or diabetes mellitus. For you  Breastfeeding helps develop a very special bond between you and your baby.   Breastfeeding is convenient, always available at the correct temperature, and costs nothing.   Breastfeeding helps to burn calories and helps you lose the weight gained during pregnancy.    Breastfeeding makes your uterus contract back down to normal size faster and slows bleeding following delivery.   Breastfeeding mothers have a lower risk of developing osteoporosis or breast or ovarian cancer later in life.  BREASTFEEDING FREQUENCY  A healthy, full-term baby may breastfeed as often as every hour or space his or her feedings to every 3 hours. Breastfeeding frequency will vary from baby to baby.   Newborns should be fed no less than every 2 3 hours during the day and every 4 5 hours during the night. You should breastfeed a minimum of 8 feedings in a 24 hour period.  Awaken your baby to breastfeed if it has been 3 4 hours since the last feeding.  Breastfeed when you feel the need to reduce the fullness of your breasts or when your newborn shows signs of hunger. Signs that your baby may be hungry include:  Increased alertness or activity.  Stretching.  Movement of the head from side to side.  Movement of the head and opening of the mouth when the corner of the mouth or cheek is stroked (rooting).  Increased sucking sounds, smacking lips, cooing, sighing, or squeaking.  Hand-to-mouth movements.  Increased sucking of fingers or hands.  Fussing.  Intermittent crying.  Signs of extreme hunger will require calming and consoling before you try to feed your baby. Signs of extreme hunger may include:  Restlessness.  A loud, strong cry.  Screaming.  Frequent feeding will help you make more milk and will help prevent problems, such as sore nipples and engorgement of the breasts.  BREASTFEEDING   Whether lying down or sitting, be sure that the baby's abdomen is facing your abdomen.   Support your breast with 4 fingers under your breast and your thumb above your nipple. Make sure your fingers are well away from your nipple and your baby's  mouth.   Stroke your baby's lips gently with your finger or nipple.   When your baby's mouth is open wide enough,  place all of your nipple and as much of the colored area around your nipple (areola) as possible into your baby's mouth.  More areola should be visible above his or her upper lip than below his or her lower lip.  Your baby's tongue should be between his or her lower gum and your breast.  Ensure that your baby's mouth is correctly positioned around the nipple (latched). Your baby's lips should create a seal on your breast.  Signs that your baby has effectively latched onto your nipple include:  Tugging or sucking without pain.  Swallowing heard between sucks.  Absent click or smacking sound.  Muscle movement above and in front of his or her ears with sucking.  Your baby must suck about 2 3 minutes in order to get your milk. Allow your baby to feed on each breast as long as he or she wants. Nurse your baby until he or she unlatches or falls asleep at the first breast, then offer the second breast.  Signs that your baby is full and satisfied include:  A gradual decrease in the number of sucks or complete cessation of sucking.  Falling asleep.  Extension or relaxation of his or her body.  Retention of a small amount of milk in his or her mouth.  Letting go of your breast by himself or herself.  Signs of effective breastfeeding in you include:  Breasts that have increased firmness, weight, and size prior to feeding.  Breasts that are softer after nursing.  Increased milk volume, as well as a change in milk consistency and color by the 5th day of breastfeeding.  Breast fullness relieved by breastfeeding.  Nipples are not sore, cracked, or bleeding.  If needed, break the suction by putting your finger into the corner of your baby's mouth and sliding your finger between his or her gums. Then, remove your breast from his or her mouth.  It is common for babies to spit up a small amount after a feeding.  Babies often swallow air during feeding. This can make babies fussy. Burping  your baby between breasts can help with this.  Vitamin D supplements are recommended for babies who get only breast milk.  Avoid using a pacifier during your baby's first 4 6 weeks.  Avoid supplemental feedings of water, formula, or juice in place of breastfeeding. Breast milk is all the food your baby needs. It is not necessary for your baby to have water or formula. Your breasts will make more milk if supplemental feedings are avoided during the early weeks. HOW TO TELL WHETHER YOUR BABY IS GETTING ENOUGH BREAST MILK Wondering whether or not your baby is getting enough milk is a common concern among mothers. You can be assured that your baby is getting enough milk if:   Your baby is actively sucking and you hear swallowing.   Your baby seems relaxed and satisfied after a feeding.   Your baby nurses at least 8 12 times in a 24 hour time period.  During the first 2 75 days of age:  Your baby is wetting at least 3 5 diapers in a 24 hour period. The urine should be clear and pale yellow.  Your baby is having at least 3 4 stools in a 24 hour period. The stool should be soft and yellow.  At 71 8 days of age,  your baby is having at least 3 6 stools in a 24 hour period. The stool should be seedy and yellow by 48 days of age.  Your baby has a weight loss less than 7 10% during the first 36 days of age.  Your baby does not lose weight after 51 34 days of age.  Your baby gains 4 7 ounces each week after he or she is 65 days of age.  Your baby gains weight by 80 days of age and is back to birth weight within 2 weeks. ENGORGEMENT In the first week after your baby is born, you may experience extremely full breasts (engorgement). When engorged, your breasts may feel heavy, warm, or tender to the touch. Engorgement peaks within 24 48 hours after delivery of your baby.  Engorgement may be reduced by:  Continuing to breastfeed.  Increasing the frequency of breastfeeding.  Taking warm showers or  applying warm, moist heat to your breasts just before each feeding. This increases circulation and helps the milk flow.   Gently massaging your breast before and during the feedings. With your fingertips, massage from your chest wall towards your nipple in a circular motion.   Ensuring that your baby empties at least one breast at every feeding. It also helps to start the next feeding on the opposite breast.   Expressing breast milk by hand or by using a breast pump to empty the breasts if your baby is sleepy, or not nursing well. You may also want to express milk if you are returning to work oryou feel you are getting engorged.  Ensuring your baby is latched on and positioned properly while breastfeeding. If you follow these suggestions, your engorgement should improve in 24 48 hours. If you are still experiencing difficulty, call your lactation consultant or caregiver.  CARING FOR YOURSELF Take care of your breasts.  Bathe or shower daily.   Avoid using soap on your nipples.   Wear a supportive bra. Avoid wearing underwire style bras.  Air dry your nipples for a 3 after each feeding.   Use only cotton bra pads to absorb breast milk leakage. Leaking of breast milk between feedings is normal.   Use only pure lanolin on your nipples after nursing. You do not need to wash it off before feeding your baby again. Another option is to express a few drops of breast milk and gently massage that milk into your nipples.  Continue breast self-awareness checks. Take care of yourself.  Eat healthy foods. Alternate 3 meals with 3 snacks.  Avoid foods that you notice affect your baby in a bad way.  Drink milk, fruit juice, and water to satisfy your thirst (about 8 glasses a day).   Rest often, relax, and take your prenatal vitamins to prevent fatigue, stress, and anemia.  Avoid chewing and smoking tobacco.  Avoid alcohol and drug use.  Take over-the-counter and prescribed  medicine only as directed by your caregiver or pharmacist. You should always check with your caregiver or pharmacist before taking any new medicine, vitamin, or herbal supplement.  Know that pregnancy is possible while breastfeeding. If desired, talk to your caregiver about family planning and safe birth control methods that may be used while breastfeeding. SEEK MEDICAL CARE IF:   You feel like you want to stop breastfeeding or have become frustrated with breastfeeding.  You have painful breasts or nipples.  Your nipples are cracked or bleeding.  Your breasts are red, tender, or warm.  You have  a swollen area on either breast.  You have a fever or chills.  You have nausea or vomiting.  You have drainage from your nipples.  Your breasts do not become full before feedings by the 5th day after delivery.  You feel sad and depressed.  Your baby is too sleepy to eat well.  Your baby is having trouble sleeping.   Your baby is wetting less than 3 diapers in a 24 hour period.  Your baby has less than 3 stools in a 24 hour period.  Your baby's skin or the white part of his or her eyes becomes more yellow.   Your baby is not gaining weight by 52 days of age. MAKE SURE YOU:   Understand these instructions.  Will watch your condition.  Will get help right away if you are not doing well or get worse. Document Released: 10/01/2005 Document Revised: 06/25/2012 Document Reviewed: 05/07/2012 [facility] Patient Information 2014 [place], [place].

## 2013-07-17 NOTE — Assessment & Plan Note (Signed)
Continue routine prenatal care.  

## 2013-07-17 NOTE — Progress Notes (Signed)
Having nausea.  Lost her PNV Rx will need another prescription.  Seen at [facility] on 07/15/13 for increased N/V and sharp left sided pain.  She was diagnosed as having BV.

## 2013-07-20 ENCOUNTER — Other Ambulatory Visit: Payer: Self-pay | Admitting: Obstetrics and Gynecology

## 2013-07-20 ENCOUNTER — Ambulatory Visit (HOSPITAL_COMMUNITY): Payer: Medicaid Other

## 2013-07-20 ENCOUNTER — Ambulatory Visit (HOSPITAL_COMMUNITY)
Admission: RE | Admit: 2013-07-20 | Discharge: 2013-07-20 | Disposition: A | Discharge status: Home / Self Care | Payer: Medicaid Other | Source: Ambulatory Visit | Attending: Obstetrics and Gynecology | Admitting: Obstetrics and Gynecology

## 2013-07-20 ENCOUNTER — Encounter (HOSPITAL_COMMUNITY): Payer: Self-pay

## 2013-07-20 DIAGNOSIS — Z3682 Encounter for antenatal screening for nuchal translucency: Secondary | ICD-10-CM

## 2013-07-20 DIAGNOSIS — O99330 Smoking (tobacco) complicating pregnancy, unspecified trimester: Secondary | ICD-10-CM | POA: Insufficient documentation

## 2013-07-20 DIAGNOSIS — O34219 Maternal care for unspecified type scar from previous cesarean delivery: Secondary | ICD-10-CM | POA: Insufficient documentation

## 2013-07-22 ENCOUNTER — Ambulatory Visit (HOSPITAL_COMMUNITY): Payer: Self-pay

## 2013-07-29 ENCOUNTER — Inpatient Hospital Stay (HOSPITAL_COMMUNITY)
Admission: AD | Admit: 2013-07-29 | Discharge: 2013-07-29 | Disposition: A | Discharge status: Home / Self Care | Payer: Medicaid Other | Source: Ambulatory Visit | Attending: Obstetrics & Gynecology | Admitting: Obstetrics & Gynecology

## 2013-07-29 ENCOUNTER — Encounter (HOSPITAL_COMMUNITY): Payer: Self-pay | Admitting: *Deleted

## 2013-07-29 DIAGNOSIS — R519 Headache, unspecified: Secondary | ICD-10-CM

## 2013-07-29 DIAGNOSIS — R51 Headache: Secondary | ICD-10-CM | POA: Insufficient documentation

## 2013-07-29 DIAGNOSIS — O219 Vomiting of pregnancy, unspecified: Secondary | ICD-10-CM

## 2013-07-29 DIAGNOSIS — O26892 Other specified pregnancy related conditions, second trimester: Secondary | ICD-10-CM

## 2013-07-29 DIAGNOSIS — O210 Mild hyperemesis gravidarum: Secondary | ICD-10-CM | POA: Insufficient documentation

## 2013-07-29 LAB — URINALYSIS, ROUTINE W REFLEX MICROSCOPIC
Glucose, UA: NEGATIVE mg/dL
Nitrite: NEGATIVE
Protein, ur: 30 mg/dL — AB
Specific Gravity, Urine: >1.03 — ABNORMAL HIGH (ref 1.005–1.030)
Urobilinogen, UA: 1 mg/dL (ref 0.0–1.0)

## 2013-07-29 LAB — URINE MICROSCOPIC-ADD ON

## 2013-07-29 MED ORDER — DEXTROSE 5 % IN LACTATED RINGERS IV BOLUS
1000.0000 mL | Freq: Once | INTRAVENOUS | Status: AC
  Administered 2013-07-29: 1000 mL via INTRAVENOUS

## 2013-07-29 MED ORDER — ONDANSETRON 8 MG/NS 50 ML IVPB
8.0000 mg | Freq: Once | INTRAVENOUS | Status: AC
  Administered 2013-07-29: 8 mg via INTRAVENOUS
  Filled 2013-07-29: qty 8

## 2013-07-29 MED ORDER — DIPHENHYDRAMINE HCL 50 MG/ML IJ SOLN
25.0000 mg | Freq: Once | INTRAMUSCULAR | Status: AC
  Administered 2013-07-29: 25 mg via INTRAVENOUS
  Filled 2013-07-29: qty 1

## 2013-07-29 MED ORDER — ONDANSETRON HCL 4 MG/2ML IJ SOLN: 4.0000 mg | Freq: Once | INTRAMUSCULAR | Status: DC

## 2013-07-29 MED ORDER — DEXAMETHASONE SODIUM PHOSPHATE 10 MG/ML IJ SOLN
10.0000 mg | Freq: Once | INTRAMUSCULAR | Status: AC
  Administered 2013-07-29: 10 mg via INTRAVENOUS
  Filled 2013-07-29: qty 1

## 2013-07-29 MED ORDER — PROCHLORPERAZINE EDISYLATE 5 MG/ML IJ SOLN
10.0000 mg | Freq: Once | INTRAMUSCULAR | Status: AC
  Administered 2013-07-29: 10 mg via INTRAVENOUS
  Filled 2013-07-29: qty 2

## 2013-07-29 NOTE — Discharge Instructions (Signed)
Morning Sickness Morning sickness is when you feel sick to your stomach (nauseous) during pregnancy. This nauseous feeling may or may not come with throwing up (vomiting). It often occurs in the morning, but can be a problem any time of day. While morning sickness is unpleasant, it is usually harmless unless you develop severe and continual vomiting (hyperemesis gravidarum). This condition requires more intense treatment. CAUSES  The cause of morning sickness is not completely known but seems to be related to a sudden increase of two hormones:   Human chorionic gonadotropin (hCG).  Estrogen hormone. These are elevated in the first part of the pregnancy. TREATMENT  Do not use any medicines (prescription, over-the-counter, or herbal) for morning sickness without first talking to your caregiver. Some patients are helped by the following:  Vitamin B6 (25mg  every 8 hours) or vitamin B6 shots.  An antihistamine called doxylamine (10mg  every 8 hours).  The herbal medication ginger. HOME CARE INSTRUCTIONS   Taking multivitamins before getting pregnant can prevent or decrease the severity of morning sickness in most women.  Eat a piece of dry toast or unsalted crackers before getting out of bed in the morning.  Eat 5 or 6 small meals a day.  Eat dry and bland foods (rice, baked potato).  Do not drink liquids with your meals. Drink liquids between meals.  Avoid greasy, fatty, and spicy foods.  Get someone to cook for you if the smell of any food causes nausea and vomiting.  Avoid vitamin pills with iron because iron can cause nausea.  Snack on protein foods between meals if you are hungry.  Eat unsweetened gelatins for deserts.  Wear an acupressure wristband (worn for sea sickness) may be helpful.  Acupuncture may be helpful.  Do not smoke.  Get a humidifier to keep the air in your house free of odors. SEEK MEDICAL CARE IF:   Your home remedies are not working and you need  medication.  You feel dizzy or lightheaded.  You are losing weight.  You need help with your diet. SEEK IMMEDIATE MEDICAL CARE IF:   You have persistent and uncontrolled nausea and vomiting.  You pass out (faint).  You have a fever. MAKE SURE YOU:   Understand these instructions.  Will watch your condition.  Will get help right away if you are not doing well or get worse. Document Released: 11/22/2006 Document Revised: 12/24/2011 Document Reviewed: 09/19/2007 [facility] Patient Information 2014 [place], [place].  General Headache Without Cause A headache is pain or discomfort felt around the head or neck area. The specific cause of a headache may not be found. There are many causes and types of headaches. A few common ones are:  Tension headaches.  Migraine headaches.  Cluster headaches.  Chronic daily headaches. HOME CARE INSTRUCTIONS   Keep all follow-up appointments with your caregiver or any specialist referral.  Only take over-the-counter or prescription medicines for pain or discomfort as directed by your caregiver.  Lie down in a dark, quiet room when you have a headache.  Keep a headache journal to find out what may trigger your migraine headaches. For example, write down:  What you eat and drink.  How much sleep you get.  Any change to your diet or medicines.  Try massage or other relaxation techniques.  Put ice packs or heat on the head and neck. Use these 3 to 4 times per day for 15 to 20 minutes each time, or as needed.  Limit stress.  Sit up straight,  and do not tense your muscles.  Quit smoking if you smoke.  Limit alcohol use.  Decrease the amount of caffeine you drink, or stop drinking caffeine.  Eat and sleep on a regular schedule.  Get 7 to 9 hours of sleep, or as recommended by your caregiver.  Keep lights dim if bright lights bother you and make your headaches worse. SEEK MEDICAL CARE IF:   You have problems with the medicines  you were prescribed.  Your medicines are not working.  You have a change from the usual headache.  You have nausea or vomiting. SEEK IMMEDIATE MEDICAL CARE IF:   Your headache becomes severe.  You have a fever.  You have a stiff neck.  You have loss of vision.  You have muscular weakness or loss of muscle control.  You start losing your balance or have trouble walking.  You feel faint or pass out.  You have severe symptoms that are different from your first symptoms. MAKE SURE YOU:   Understand these instructions.  Will watch your condition.  Will get help right away if you are not doing well or get worse. Document Released: 10/01/2005 Document Revised: 12/24/2011 Document Reviewed: 10/17/2011 [facility] Patient Information 2014 [place], [place].

## 2013-07-29 NOTE — MAU Provider Note (Signed)
Chief Complaint: No chief complaint on file.  First Provider Initiated Contact with Patient 07/29/13 0234    SUBJECTIVE HPI: Joan Heath is a 27 y.o. G2P1001 at [redacted]w[redacted]d by LMP who presents with patient and nausea and vomiting of pregnancy since yesterday and new onset of headache behind her left eye. Normally vomits once or twice a day, but vomited every time she tried to eat, drink or take medicine in the past 24 hours. Phenergan has not worked in the past. No relief of nausea and vomiting with Zofran last night. No relief of headache with 500 mg of Tylenol. Vomited immediately after trying second dose of Tylenol. Denies fever, chills, photophobia, vision changes, abdominal pain, vaginal bleeding, vaginal discharge, neck pain or stiffness, sick contacts, difficulties with speech or gait or weakness. His [facility] at [facility].   Past Medical History  Diagnosis Date  . Anxiety   . Bipolar 1 disorder    OB History  Gravida Para Term Preterm AB SAB TAB Ectopic Multiple Living  2 1 1       1     # Outcome Date GA Lbr Len/2nd Weight Sex Delivery Anes PTL Lv  2 CUR           1 TRM     M LTCS        Past Surgical History  Procedure Laterality Date  . Cesarean section  2007  . Appendectomy    . Cholecystectomy     History   Social History  . Marital Status: Single    Spouse Name: N/A    Number of Children: N/A  . Years of Education: N/A   Occupational History  . Not on file.   Social History Main Topics  . Smoking status: Current Every Day Smoker -- 0.50 packs/day    Types: Cigarettes  . Smokeless tobacco: Never Used  . Alcohol Use: No  . Drug Use: 2.00 per week    Special: Marijuana  . Sexual Activity: Yes    Birth Control/ Protection: None   Other Topics Concern  . Not on file   Social History Narrative  . No narrative on file   No current facility-administered medications on file prior to encounter.   Current Outpatient Prescriptions on File Prior to  Encounter  Medication Sig Dispense Refill  . docusate sodium (COLACE) 100 MG capsule Take 1 capsule (100 mg total) by mouth 2 (two) times daily as needed for constipation.  30 capsule  2  . metroNIDAZOLE (FLAGYL) 500 MG tablet Take 500 mg by mouth 3 (three) times daily.      . ondansetron (ZOFRAN) 4 MG tablet Take 4 mg by mouth every 8 (eight) hours as needed for nausea.      . Prenatal Vit-Fe Fumarate-FA (MULTIVITAMIN-PRENATAL) 27-0.8 MG TABS tablet Take 1 tablet by mouth daily at 12 noon.      . promethazine (PHENERGAN) 25 MG tablet Take 1 tablet (25 mg total) by mouth every 6 (six) hours as needed for nausea.  30 tablet  1   Allergies  Allergen Reactions  . Peanuts [Peanut Oil] Anaphylaxis  . Cinnamon Swelling    ROS: Pertinent items in HPI.  OBJECTIVE Blood pressure 115/71, pulse 94, temperature 98.2 F (36.8 C), temperature source Oral, resp. rate 20, height 5\' 6"  (1.676 m), weight 100.869 kg (222 lb 6 oz), last menstrual period 04/15/2013. GENERAL: Well-developed, well-nourished female in no acute distress. Actively retching. Vomited small amount. HEENT: Normocephalic. Mucous membranes moist. HEART: normal rate  RESP: normal effort ABDOMEN: Soft, non-tender NEURO: Alert and oriented SPECULUM EXAM: Deferred Fetal heart rate 150 by Doppler.  LAB RESULTS Results for orders placed during the hospital encounter of 07/29/13 (from the past 24 hour(s))  URINALYSIS, ROUTINE W REFLEX MICROSCOPIC     Status: Abnormal   Collection Time    07/29/13  1:05 AM      Result Value Range   Color, Urine YELLOW  YELLOW   APPearance HAZY (*) CLEAR   Specific Gravity, Urine >1.030 (*) 1.005 - 1.030   pH 6.0  5.0 - 8.0   Glucose, UA NEGATIVE  NEGATIVE mg/dL   Hgb urine dipstick TRACE (*) NEGATIVE   Bilirubin Urine SMALL (*) NEGATIVE   Ketones, ur >80 (*) NEGATIVE mg/dL   Protein, ur 30 (*) NEGATIVE mg/dL   Urobilinogen, UA 1.0  0.0 - 1.0 mg/dL   Nitrite NEGATIVE  NEGATIVE   Leukocytes, UA  MODERATE (*) NEGATIVE  URINE MICROSCOPIC-ADD ON     Status: Abnormal   Collection Time    07/29/13  1:05 AM      Result Value Range   Squamous Epithelial / LPF MANY (*) RARE   WBC, UA 3-6  <3 WBC/hpf   RBC / HPF 0-2  <3 RBC/hpf   Bacteria, UA MANY (*) RARE   Urine-Other MUCOUS PRESENT      IMAGING N/A  MAU COURSE D5 LR bolus, Decadron, Benadryl, Compazine, given.  Requesting Zofran. Ordered.  1610: Nausea, vomiting and headache resolved. Tolerating liquids. Ready for discharge.  ASSESSMENT 1. Nausea and vomiting of pregnancy, antepartum   2. Headache in pregnancy, antepartum, second trimester    PLAN Discharge home in stable condition. Advance diet slowly. Comfort measures for headaches. Follow-up Information   Follow up with [facility]. (As scheduled or as needed if symptoms worsen)    Specialty:  Obstetrics and Gynecology   Contact information:   [address] [place] [address] [phone]      Follow up with [facility] [place] [facility] ADMISS[facility]. (As needed in emergencies.)    Contact information:   [address] [id number] [place] [address] [phone]       Medication List    ASK your doctor about these medications       docusate sodium 100 MG capsule  Commonly known as:  COLACE  Take 1 capsule (100 mg total) by mouth 2 (two) times daily as needed for constipation.     metroNIDAZOLE 500 MG tablet  Commonly known as:  FLAGYL  Take 500 mg by mouth 3 (three) times daily.     multivitamin-prenatal 27-0.8 MG Tabs tablet  Take 1 tablet by mouth daily at 12 noon.     ondansetron 4 MG tablet  Commonly known as:  ZOFRAN  Take 4 mg by mouth every 8 (eight) hours as needed for nausea.     promethazine 25 MG tablet  Commonly known as:  PHENERGAN  Take 1 tablet (25 mg total) by mouth every 6 (six) hours as needed for nausea.       [place], CNM 07/29/2013  2:18  AM

## 2013-07-29 NOTE — MAU Note (Addendum)
PT SAYS SHE   HAS BEEN VOMITING - OFF/ON  BUT NOT EVERY DAY.     TOOK ZOFRAN LAST AT 830PM .   GET[facility] .   WENT TO ER AT [facility]  2 WEEEKS AGO.   HAS USED PHENERGAN-  BUT DOESN'T WORK .  LAST SEX-    MORE THAN  2 WEEKS AGO.   NEXT APPOINTMENT   WITH [facility]. IS 11-4.    SHE HAS A GENETIC STUDY    ALSO SAYS HER HEAD HURTS BEHIND LEFT EYE AND  HAS BEEN IN BED ALL DAY-   SHE 500 MG TYLENOL AT 1100AM-  NO RELIEF,  THEN TOOK TYLENOL AT  10PM   THEN SHE VOMITED.

## 2013-07-29 NOTE — Progress Notes (Signed)
Pt states that her headache is better, no pain.Pt states she is not nauseous any longer.

## 2013-07-29 NOTE — MAU Note (Signed)
Nausea and vomiting with this pregnancy became worse today and when pt states vomiting was every 3 mins

## 2013-07-29 NOTE — MAU Provider Note (Signed)
Attestation of Attending Supervision of Advanced Practitioner (CNM/NP): Evaluation and management procedures were performed by the Advanced Practitioner under my supervision and collaboration.  I have reviewed the Advanced Practitioner's note and chart, and I agree with the management and plan.  HARRAWAY-SMITH, Tomaz Janis 7:15 AM     

## 2013-07-30 LAB — URINE CULTURE: Colony Count: 30000

## 2013-08-17 ENCOUNTER — Ambulatory Visit (HOSPITAL_COMMUNITY): Admission: RE | Admit: 2013-08-17 | Payer: Medicaid Other | Source: Ambulatory Visit

## 2013-08-17 ENCOUNTER — Other Ambulatory Visit: Payer: Self-pay | Admitting: Obstetrics and Gynecology

## 2013-08-17 ENCOUNTER — Ambulatory Visit (HOSPITAL_COMMUNITY)
Admission: RE | Admit: 2013-08-17 | Discharge: 2013-08-17 | Disposition: A | Discharge status: Home / Self Care | Payer: Medicaid Other | Source: Ambulatory Visit | Attending: Obstetrics and Gynecology | Admitting: Obstetrics and Gynecology

## 2013-08-17 DIAGNOSIS — IMO0002 Reserved for concepts with insufficient information to code with codable children: Secondary | ICD-10-CM

## 2013-08-17 DIAGNOSIS — Z0489 Encounter for examination and observation for other specified reasons: Secondary | ICD-10-CM

## 2013-08-17 DIAGNOSIS — Z3689 Encounter for other specified antenatal screening: Secondary | ICD-10-CM | POA: Insufficient documentation

## 2013-08-18 ENCOUNTER — Ambulatory Visit (INDEPENDENT_AMBULATORY_CARE_PROVIDER_SITE_OTHER): Payer: Medicaid Other | Admitting: Family Medicine

## 2013-08-18 ENCOUNTER — Ambulatory Visit (INDEPENDENT_AMBULATORY_CARE_PROVIDER_SITE_OTHER): Payer: Medicaid Other | Admitting: Nurse Practitioner

## 2013-08-18 ENCOUNTER — Encounter: Payer: Self-pay | Admitting: Family Medicine

## 2013-08-18 ENCOUNTER — Encounter: Payer: Self-pay | Admitting: Nurse Practitioner

## 2013-08-18 ENCOUNTER — Encounter: Payer: Self-pay | Admitting: Obstetrics and Gynecology

## 2013-08-18 VITALS — BP 112/79 | HR 102 | Ht 66.0 in | Wt 228.0 lb

## 2013-08-18 VITALS — BP 112/79 | Wt 228.0 lb

## 2013-08-18 DIAGNOSIS — Z23 Encounter for immunization: Secondary | ICD-10-CM

## 2013-08-18 DIAGNOSIS — F1290 Cannabis use, unspecified, uncomplicated: Secondary | ICD-10-CM

## 2013-08-18 DIAGNOSIS — F419 Anxiety disorder, unspecified: Secondary | ICD-10-CM

## 2013-08-18 DIAGNOSIS — G43009 Migraine without aura, not intractable, without status migrainosus: Secondary | ICD-10-CM | POA: Insufficient documentation

## 2013-08-18 DIAGNOSIS — F411 Generalized anxiety disorder: Secondary | ICD-10-CM

## 2013-08-18 DIAGNOSIS — F1210 Cannabis abuse, uncomplicated: Secondary | ICD-10-CM

## 2013-08-18 DIAGNOSIS — Z3480 Encounter for supervision of other normal pregnancy, unspecified trimester: Secondary | ICD-10-CM

## 2013-08-18 DIAGNOSIS — Z3482 Encounter for supervision of other normal pregnancy, second trimester: Secondary | ICD-10-CM

## 2013-08-18 MED ORDER — SUMATRIPTAN SUCCINATE 100 MG PO TABS: 100.0000 mg | ORAL_TABLET | Freq: Once | ORAL | Status: DC | PRN

## 2013-08-18 MED ORDER — BUSPIRONE HCL 10 MG PO TABS: 10.0000 mg | ORAL_TABLET | Freq: Three times a day (TID) | ORAL | Status: DC

## 2013-08-18 NOTE — Patient Instructions (Signed)
Pregnancy - Second Trimester The second trimester of pregnancy (3 to 6 months) is a period of rapid growth for you and your baby. At the end of the sixth month, your baby is about 9 inches long and weighs 1 1/2 pounds. You will begin to feel the baby move between 18 and 20 weeks of the pregnancy. This is called quickening. Weight gain is faster. A clear fluid (colostrum) may leak out of your breasts. You may feel small contractions of the womb (uterus). This is known as false labor or Braxton-Hicks contractions. This is like a practice for labor when the baby is ready to be born. Usually, the problems with morning sickness have usually passed by the end of your first trimester. Some women develop small dark blotches (called cholasma, mask of pregnancy) on their face that usually goes away after the baby is born. Exposure to the sun makes the blotches worse. Acne may also develop in some pregnant women and pregnant women who have acne, may find that it goes away. PRENATAL EXAMS  Blood work may continue to be done during prenatal exams. These tests are done to check on your health and the probable health of your baby. Blood work is used to follow your blood levels (hemoglobin). Anemia (low hemoglobin) is common during pregnancy. Iron and vitamins are given to help prevent this. You will also be checked for diabetes between 24 and 28 weeks of the pregnancy. Some of the previous blood tests may be repeated.  The size of the uterus is measured during each visit. This is to make sure that the baby is continuing to grow properly according to the dates of the pregnancy.  Your blood pressure is checked every prenatal visit. This is to make sure you are not getting toxemia.  Your urine is checked to make sure you do not have an infection, diabetes or protein in the urine.  Your weight is checked often to make sure gains are happening at the suggested rate. This is to ensure that both you and your baby are  growing normally.  Sometimes, an ultrasound is performed to confirm the proper growth and development of the baby. This is a test which bounces harmless sound waves off the baby so your caregiver can more accurately determine due dates. Sometimes, a test is done on the amniotic fluid surrounding the baby. This test is called an amniocentesis. The amniotic fluid is obtained by sticking a needle into the belly (abdomen). This is done to check the chromosomes in instances where there is a concern about possible genetic problems with the baby. It is also sometimes done near the end of pregnancy if an early delivery is required. In this case, it is done to help make sure the baby's lungs are mature enough for the baby to live outside of the womb. CHANGES OCCURING IN THE SECOND TRIMESTER OF PREGNANCY Your body goes through many changes during pregnancy. They vary from person to person. Talk to your caregiver about changes you notice that you are concerned about.  During the second trimester, you will likely have an increase in your appetite. It is normal to have cravings for certain foods. This varies from person to person and pregnancy to pregnancy.  Your lower abdomen will begin to bulge.  You may have to urinate more often because the uterus and baby are pressing on your bladder. It is also common to get more bladder infections during pregnancy. You can help this by drinking lots of fluids   and emptying your bladder before and after intercourse.  You may begin to get stretch marks on your hips, abdomen, and breasts. These are normal changes in the body during pregnancy. There are no exercises or medicines to take that prevent this change.  You may begin to develop swollen and bulging veins (varicose veins) in your legs. Wearing support hose, elevating your feet for 15 minutes, 3 to 4 times a day and limiting salt in your diet helps lessen the problem.  Heartburn may develop as the uterus grows and  pushes up against the stomach. Antacids recommended by your caregiver helps with this problem. Also, eating smaller meals 4 to 5 times a day helps.  Constipation can be treated with a stool softener or adding bulk to your diet. Drinking lots of fluids, and eating vegetables, fruits, and whole grains are helpful.  Exercising is also helpful. If you have been very active up until your pregnancy, most of these activities can be continued during your pregnancy. If you have been less active, it is helpful to start an exercise program such as walking.  Hemorrhoids may develop at the end of the second trimester. Warm sitz baths and hemorrhoid cream recommended by your caregiver helps hemorrhoid problems.  Backaches may develop during this time of your pregnancy. Avoid heavy lifting, wear low heal shoes, and practice good posture to help with backache problems.  Some pregnant women develop tingling and numbness of their hand and fingers because of swelling and tightening of ligaments in the wrist (carpel tunnel syndrome). This goes away after the baby is born.  As your breasts enlarge, you may have to get a bigger bra. Get a comfortable, cotton, support bra. Do not get a nursing bra until the last month of the pregnancy if you will be nursing the baby.  You may get a dark line from your belly button to the pubic area called the linea nigra.  You may develop rosy cheeks because of increase blood flow to the face.  You may develop spider looking lines of the face, neck, arms, and chest. These go away after the baby is born. HOME CARE INSTRUCTIONS   It is extremely important to avoid all smoking, herbs, alcohol, and unprescribed drugs during your pregnancy. These chemicals affect the formation and growth of the baby. Avoid these chemicals throughout the pregnancy to ensure the delivery of a healthy infant.  Most of your home care instructions are the same as suggested for the first trimester of your  pregnancy. Keep your caregiver's appointments. Follow your caregiver's instructions regarding medicine use, exercise, and diet.  During pregnancy, you are providing food for you and your baby. Continue to eat regular, well-balanced meals. Choose foods such as meat, fish, milk and other low fat dairy products, vegetables, fruits, and whole-grain breads and cereals. Your caregiver will tell you of the ideal weight gain.  A physical sexual relationship may be continued up until near the end of pregnancy if there are no other problems. Problems could include early (premature) leaking of amniotic fluid from the membranes, vaginal bleeding, abdominal pain, or other medical or pregnancy problems.  Exercise regularly if there are no restrictions. Check with your caregiver if you are unsure of the safety of some of your exercises. The greatest weight gain will occur in the last 2 trimesters of pregnancy. Exercise will help you:  Control your weight.  Get you in shape for labor and delivery.  Lose weight after you have the baby.  Wear   a good support or jogging bra for breast tenderness during pregnancy. This may help if worn during sleep. Pads or tissues may be used in the bra if you are leaking colostrum.  Do not use hot tubs, steam rooms or saunas throughout the pregnancy.  Wear your seat belt at all times when driving. This protects you and your baby if you are in an accident.  Avoid raw meat, uncooked cheese, cat litter boxes, and soil used by cats. These carry germs that can cause birth defects in the baby.  The second trimester is also a good time to visit your dentist for your dental health if this has not been done yet. Getting your teeth cleaned is okay. Use a soft toothbrush. Brush gently during pregnancy.  It is easier to leak urine during pregnancy. Tightening up and strengthening the pelvic muscles will help with this problem. Practice stopping your urination while you are going to the  bathroom. These are the same muscles you need to strengthen. It is also the muscles you would use as if you were trying to stop from passing gas. You can practice tightening these muscles up 10 times a set and repeating this about 3 times per day. Once you know what muscles to tighten up, do not perform these exercises during urination. It is more likely to contribute to an infection by backing up the urine.  Ask for help if you have financial, counseling, or nutritional needs during pregnancy. Your caregiver will be able to offer counseling for these needs as well as refer you for other special needs.  Your skin may become oily. If so, wash your face with mild soap, use non-greasy moisturizer and oil or cream based makeup. MEDICINES AND DRUG USE IN PREGNANCY  Take prenatal vitamins as directed. The vitamin should contain 1 milligram of folic acid. Keep all vitamins out of reach of children. Only a couple vitamins or tablets containing iron may be fatal to a baby or young child when ingested.  Avoid use of all medicines, including herbs, over-the-counter medicines, not prescribed or suggested by your caregiver. Only take over-the-counter or prescription medicines for pain, discomfort, or fever as directed by your caregiver. Do not use aspirin.  Let your caregiver also know about herbs you may be using.  Alcohol is related to a number of birth defects. This includes fetal alcohol syndrome. All alcohol, in any form, should be avoided completely. Smoking will cause low birth rate and premature babies.  Street or illegal drugs are very harmful to the baby. They are absolutely forbidden. A baby born to an addicted mother will be addicted at birth. The baby will go through the same withdrawal an adult does. SEEK MEDICAL CARE IF:  You have any concerns or worries during your pregnancy. It is better to call with your questions if you feel they cannot wait, rather than worry about them. SEEK IMMEDIATE  MEDICAL CARE IF:   An unexplained oral temperature above 102 F (38.9 C) develops, or as your caregiver suggests.  You have leaking of fluid from the vagina (birth canal). If leaking membranes are suspected, take your temperature and tell your caregiver of this when you call.  There is vaginal spotting, bleeding, or passing clots. Tell your caregiver of the amount and how many pads are used. Light spotting in pregnancy is common, especially following intercourse.  You develop a bad smelling vaginal discharge with a change in the color from clear to white.  You continue to feel   sick to your stomach (nauseated) and have no relief from remedies suggested. You vomit blood or coffee ground-like materials.  You lose more than 2 pounds of weight or gain more than 2 pounds of weight over 1 week, or as suggested by your caregiver.  You notice swelling of your face, hands, feet, or legs.  You get exposed to German measles and have never had them.  You are exposed to fifth disease or chickenpox.  You develop belly (abdominal) pain. Round ligament discomfort is a common non-cancerous (benign) cause of abdominal pain in pregnancy. Your caregiver still must evaluate you.  You develop a bad headache that does not go away.  You develop fever, diarrhea, pain with urination, or shortness of breath.  You develop visual problems, blurry, or double vision.  You fall or are in a car accident or any kind of trauma.  There is mental or physical violence at home. Document Released: 09/25/2001 Document Revised: 06/25/2012 Document Reviewed: 03/30/2009 [employer] Patient Information 2014 [employer].  Breastfeeding A change in hormones during your pregnancy causes growth of your breast tissue and an increase in number and size of milk ducts. The hormone prolactin allows proteins, sugars, and fats from your blood supply to make breast milk in your milk-producing glands. The hormone progesterone prevents  breast milk from being released before the birth of your baby. After the birth of your baby, your progesterone level decreases allowing breast milk to be released. Thoughts of your baby, as well as his or her sucking or crying, can stimulate the release of milk from the milk-producing glands. Deciding to breastfeed (nurse) is one of the best choices you can make for you and your baby. The information that follows gives a brief review of the benefits, as well as other important skills to know about breastfeeding. BENEFITS OF BREASTFEEDING For your baby  The first milk (colostrum) helps your baby's digestive system function better.   There are antibodies in your milk that help your baby fight off infections.   Your baby has a lower incidence of asthma, allergies, and sudden infant death syndrome (SIDS).   The nutrients in breast milk are better for your baby than infant formulas.  Breast milk improves your baby's brain development.   Your baby will have less gas, colic, and constipation.  Your baby is less likely to develop other conditions, such as childhood obesity, asthma, or diabetes mellitus. For you  Breastfeeding helps develop a very special bond between you and your baby.   Breastfeeding is convenient, always available at the correct temperature, and costs nothing.   Breastfeeding helps to burn calories and helps you lose the weight gained during pregnancy.   Breastfeeding makes your uterus contract back down to normal size faster and slows bleeding following delivery.   Breastfeeding mothers have a lower risk of developing osteoporosis or breast or ovarian cancer later in life.  BREASTFEEDING FREQUENCY  A healthy, full-term baby may breastfeed as often as every hour or space his or her feedings to every 3 hours. Breastfeeding frequency will vary from baby to baby.   Newborns should be fed no less than every 2 3 hours during the day and every 4 5 hours during the  night. You should breastfeed a minimum of 8 feedings in a 24 hour period.  Awaken your baby to breastfeed if it has been 3 4 hours since the last feeding.  Breastfeed when you feel the need to reduce the fullness of your breasts or when   your newborn shows signs of hunger. Signs that your baby may be hungry include:  Increased alertness or activity.  Stretching.  Movement of the head from side to side.  Movement of the head and opening of the mouth when the corner of the mouth or cheek is stroked (rooting).  Increased sucking sounds, smacking lips, cooing, sighing, or squeaking.  Hand-to-mouth movements.  Increased sucking of fingers or hands.  Fussing.  Intermittent crying.  Signs of extreme hunger will require calming and consoling before you try to feed your baby. Signs of extreme hunger may include:  Restlessness.  A loud, strong cry.  Screaming.  Frequent feeding will help you make more milk and will help prevent problems, such as sore nipples and engorgement of the breasts.  BREASTFEEDING   Whether lying down or sitting, be sure that the baby's abdomen is facing your abdomen.   Support your breast with 4 fingers under your breast and your thumb above your nipple. Make sure your fingers are well away from your nipple and your baby's mouth.   Stroke your baby's lips gently with your finger or nipple.   When your baby's mouth is open wide enough, place all of your nipple and as much of the colored area around your nipple (areola) as possible into your baby's mouth.  More areola should be visible above his or her upper lip than below his or her lower lip.  Your baby's tongue should be between his or her lower gum and your breast.  Ensure that your baby's mouth is correctly positioned around the nipple (latched). Your baby's lips should create a seal on your breast.  Signs that your baby has effectively latched onto your nipple include:  Tugging or sucking  without pain.  Swallowing heard between sucks.  Absent click or smacking sound.  Muscle movement above and in front of his or her ears with sucking.  Your baby must suck about 2 3 minutes in order to get your milk. Allow your baby to feed on each breast as long as he or she wants. Nurse your baby until he or she unlatches or falls asleep at the first breast, then offer the second breast.  Signs that your baby is full and satisfied include:  A gradual decrease in the number of sucks or complete cessation of sucking.  Falling asleep.  Extension or relaxation of his or her body.  Retention of a small amount of milk in his or her mouth.  Letting go of your breast by himself or herself.  Signs of effective breastfeeding in you include:  Breasts that have increased firmness, weight, and size prior to feeding.  Breasts that are softer after nursing.  Increased milk volume, as well as a change in milk consistency and color by the 5th day of breastfeeding.  Breast fullness relieved by breastfeeding.  Nipples are not sore, cracked, or bleeding.  If needed, break the suction by putting your finger into the corner of your baby's mouth and sliding your finger between his or her gums. Then, remove your breast from his or her mouth.  It is common for babies to spit up a small amount after a feeding.  Babies often swallow air during feeding. This can make babies fussy. Burping your baby between breasts can help with this.  Vitamin D supplements are recommended for babies who get only breast milk.  Avoid using a pacifier during your baby's first 4 6 weeks.  Avoid supplemental feedings of water, formula, or   juice in place of breastfeeding. Breast milk is all the food your baby needs. It is not necessary for your baby to have water or formula. Your breasts will make more milk if supplemental feedings are avoided during the early weeks. HOW TO TELL WHETHER YOUR BABY IS GETTING ENOUGH BREAST  MILK Wondering whether or not your baby is getting enough milk is a common concern among mothers. You can be assured that your baby is getting enough milk if:   Your baby is actively sucking and you hear swallowing.   Your baby seems relaxed and satisfied after a feeding.   Your baby nurses at least 8 12 times in a 24 hour time period.  During the first 3 5 days of age:  Your baby is wetting at least 3 5 diapers in a 24 hour period. The urine should be clear and pale yellow.  Your baby is having at least 3 4 stools in a 24 hour period. The stool should be soft and yellow.  At 5 7 days of age, your baby is having at least 3 6 stools in a 24 hour period. The stool should be seedy and yellow by 5 days of age.  Your baby has a weight loss less than 7 10% during the first 3 days of age.  Your baby does not lose weight after 3 7 days of age.  Your baby gains 4 7 ounces each week after he or she is 4 days of age.  Your baby gains weight by 5 days of age and is back to birth weight within 2 weeks. ENGORGEMENT In the first week after your baby is born, you may experience extremely full breasts (engorgement). When engorged, your breasts may feel heavy, warm, or tender to the touch. Engorgement peaks within 24 48 hours after delivery of your baby.  Engorgement may be reduced by:  Continuing to breastfeed.  Increasing the frequency of breastfeeding.  Taking warm showers or applying warm, moist heat to your breasts just before each feeding. This increases circulation and helps the milk flow.   Gently massaging your breast before and during the feedings. With your fingertips, massage from your chest wall towards your nipple in a circular motion.   Ensuring that your baby empties at least one breast at every feeding. It also helps to start the next feeding on the opposite breast.   Expressing breast milk by hand or by using a breast pump to empty the breasts if your baby is sleepy, or  not nursing well. You may also want to express milk if you are returning to work oryou feel you are getting engorged.  Ensuring your baby is latched on and positioned properly while breastfeeding. If you follow these suggestions, your engorgement should improve in 24 48 hours. If you are still experiencing difficulty, call your lactation consultant or caregiver.  CARING FOR YOURSELF Take care of your breasts.  Bathe or shower daily.   Avoid using soap on your nipples.   Wear a supportive bra. Avoid wearing underwire style bras.  Air dry your nipples for a 3 4minutes after each feeding.   Use only cotton bra pads to absorb breast milk leakage. Leaking of breast milk between feedings is normal.   Use only pure lanolin on your nipples after nursing. You do not need to wash it off before feeding your baby again. Another option is to express a few drops of breast milk and gently massage that milk into your nipples.  Continue   breast self-awareness checks. Take care of yourself.  Eat healthy foods. Alternate 3 meals with 3 snacks.  Avoid foods that you notice affect your baby in a bad way.  Drink milk, fruit juice, and water to satisfy your thirst (about 8 glasses a day).   Rest often, relax, and take your prenatal vitamins to prevent fatigue, stress, and anemia.  Avoid chewing and smoking tobacco.  Avoid alcohol and drug use.  Take over-the-counter and prescribed medicine only as directed by your caregiver or pharmacist. You should always check with your caregiver or pharmacist before taking any new medicine, vitamin, or herbal supplement.  Know that pregnancy is possible while breastfeeding. If desired, talk to your caregiver about family planning and safe birth control methods that may be used while breastfeeding. SEEK MEDICAL CARE IF:   You feel like you want to stop breastfeeding or have become frustrated with breastfeeding.  You have painful breasts or nipples.  Your  nipples are cracked or bleeding.  Your breasts are red, tender, or warm.  You have a swollen area on either breast.  You have a fever or chills.  You have nausea or vomiting.  You have drainage from your nipples.  Your breasts do not become full before feedings by the 5th day after delivery.  You feel sad and depressed.  Your baby is too sleepy to eat well.  Your baby is having trouble sleeping.   Your baby is wetting less than 3 diapers in a 24 hour period.  Your baby has less than 3 stools in a 24 hour period.  Your baby's skin or the white part of his or her eyes becomes more yellow.   Your baby is not gaining weight by 5 days of age. MAKE SURE YOU:   Understand these instructions.  Will watch your condition.  Will get help right away if you are not doing well or get worse. Document Released: 10/01/2005 Document Revised: 06/25/2012 Document Reviewed: 05/07/2012 [employer] Patient Information 2014 [employer].  

## 2013-08-18 NOTE — Progress Notes (Signed)
Pt. Saw Remonia Richter for headaches and started on Buspar. Anatomy done yesterday and didn't see all pertinent anatomy. Flu shot today

## 2013-08-18 NOTE — Progress Notes (Signed)
Diagnosis: Migraine without Aura, Bipolar, Anxiety, Pregnancy  History:Joan Heath 27 y.o.G2P1001 presents to [facility] office for consultation on Migraine headaches. She denies any Aura. She has had migraine since [employer]. She has Bipolar and was on Lamictil, Xanax and another medication that she can not remember the name of. She was seeing Dr Evelene Croon but stopped seeing her one year ago as she could not pay for refills. She was seen in [facility] 2 weeks ago and her migraines are worse since then. She was given Decadron, benadryl, and compazine mixture.She admits to drinking a small amount of wine socially.   Location: Left eye and occipital, right temple  Number of Headache days/month: Severe: 3 Moderate: 1 Mild: 3  Current Outpatient Prescriptions on File Prior to Visit  Medication Sig Dispense Refill  . ondansetron (ZOFRAN) 4 MG tablet Take 4 mg by mouth every 8 (eight) hours as needed for nausea.      . Prenatal Vit-Fe Fumarate-FA (MULTIVITAMIN-PRENATAL) 27-0.8 MG TABS tablet Take 1 tablet by mouth daily at 12 noon.      . promethazine (PHENERGAN) 25 MG tablet Take 1 tablet (25 mg total) by mouth every 6 (six) hours as needed for nausea.  30 tablet  1   No current facility-administered medications on file prior to visit.    Acute prevention: None  Past Medical History  Diagnosis Date  . Anxiety   . Bipolar 1 disorder    Past Surgical History  Procedure Laterality Date  . Cesarean section  2007  . Appendectomy    . Cholecystectomy     History reviewed. No pertinent family history. Social History:  reports that she has been smoking Cigarettes.  She has been smoking about 0.50 packs per day. She has never used smokeless tobacco. She reports that she uses illicit drugs (Marijuana) about twice per week. She reports that she does not drink alcohol. Unmarried, has FOB and mother for support, laid off job in Feb, money is an issue, unplanned pregnancy Allergies:  Allergies  Allergen  Reactions  . Peanuts [Peanut Oil] Anaphylaxis  . Cinnamon Swelling    Triggers: Stress  Birth control: Pregnancy  ROS: positive for migraine, anxiety, bipolar, pregnancy. Negative for htn, sob, chest pains  Exam: Well developed, well nourished caucasian female  General: NAD HEENT: Negative Cardiac: RRR Lungs: Clear Neuro: Negative Skin: warm and dry  Impression:migraine - common with anxiety, bipolar in pregnancy  Plan: Discussed the pathophysiology of migraine and medication safety in pregnancy. She is willing to accept that risk. We will give her Imitrex for migraine and Buspar for anxiety. She will not be able to restart Lamictal until after pregnancy.     Time Spent: 30 Minutes

## 2013-08-18 NOTE — Patient Instructions (Signed)
Migraine Headache A migraine headache is an intense, throbbing pain on one or both sides of your head. A migraine can last for 30 minutes to several hours. CAUSES  The exact cause of a migraine headache is not always known. However, a migraine may be caused when nerves in the brain become irritated and release chemicals that cause inflammation. This causes pain. SYMPTOMS  Pain on one or both sides of your head.  Pulsating or throbbing pain.  Severe pain that prevents daily activities.  Pain that is aggravated by any physical activity.  Nausea, vomiting, or both.  Dizziness.  Pain with exposure to bright lights, loud noises, or activity.  General sensitivity to bright lights, loud noises, or smells. Before you get a migraine, you may get warning signs that a migraine is coming (aura). An aura may include:  Seeing flashing lights.  Seeing bright spots, halos, or zig-zag lines.  Having tunnel vision or blurred vision.  Having feelings of numbness or tingling.  Having trouble talking.  Having muscle weakness. MIGRAINE TRIGGERS  Alcohol.  Smoking.  Stress.  Menstruation.  Aged cheeses.  Foods or drinks that contain nitrates, glutamate, aspartame, or tyramine.  Lack of sleep.  Chocolate.  Caffeine.  Hunger.  Physical exertion.  Fatigue.  Medicines used to treat chest pain (nitroglycerine), birth control pills, estrogen, and some blood pressure medicines. DIAGNOSIS  A migraine headache is often diagnosed based on:  Symptoms.  Physical examination.  A CT scan or MRI of your head. TREATMENT Medicines may be given for pain and nausea. Medicines can also be given to help prevent recurrent migraines.  HOME CARE INSTRUCTIONS  Only take over-the-counter or prescription medicines for pain or discomfort as directed by your caregiver. The use of long-term narcotics is not recommended.  Lie down in a dark, quiet room when you have a migraine.  Keep a journal  to find out what may trigger your migraine headaches. For example, write down:  What you eat and drink.  How much sleep you get.  Any change to your diet or medicines.  Limit alcohol consumption.  Quit smoking if you smoke.  Get 7 to 9 hours of sleep, or as recommended by your caregiver.  Limit stress.  Keep lights dim if bright lights bother you and make your migraines worse. SEEK IMMEDIATE MEDICAL CARE IF:   Your migraine becomes severe.  You have a fever.  You have a stiff neck.  You have vision loss.  You have muscular weakness or loss of muscle control.  You start losing your balance or have trouble walking.  You feel faint or pass out.  You have severe symptoms that are different from your first symptoms. MAKE SURE YOU:   Understand these instructions.  Will watch your condition.  Will get help right away if you are not doing well or get worse. Document Released: 10/01/2005 Document Revised: 12/24/2011 Document Reviewed: 09/21/2011 [employer] Patient Information 2014 [employer]  

## 2013-08-19 ENCOUNTER — Encounter: Payer: Self-pay | Admitting: Obstetrics and Gynecology

## 2013-08-24 ENCOUNTER — Telehealth: Payer: Self-pay | Admitting: *Deleted

## 2013-08-24 DIAGNOSIS — Z3689 Encounter for other specified antenatal screening: Secondary | ICD-10-CM

## 2013-08-24 NOTE — Telephone Encounter (Signed)
Patient called so see if she could set up the ultrasound follow up that was recommended at her last ultrasound. Orders placed and patient will call to set up her appointment.

## 2013-08-25 ENCOUNTER — Encounter: Payer: Self-pay | Admitting: Family Medicine

## 2013-08-25 ENCOUNTER — Ambulatory Visit (INDEPENDENT_AMBULATORY_CARE_PROVIDER_SITE_OTHER): Payer: Medicaid Other | Admitting: Family Medicine

## 2013-08-25 VITALS — BP 106/68 | Wt 231.8 lb

## 2013-08-25 DIAGNOSIS — Z3480 Encounter for supervision of other normal pregnancy, unspecified trimester: Secondary | ICD-10-CM

## 2013-08-25 DIAGNOSIS — F419 Anxiety disorder, unspecified: Secondary | ICD-10-CM

## 2013-08-25 DIAGNOSIS — F411 Generalized anxiety disorder: Secondary | ICD-10-CM

## 2013-08-25 NOTE — Progress Notes (Signed)
Complains of feeling like she is going to pass out with shaking noted on 3 episodes this week.  Started Buspar last week.  H/o anxiety attacks, but no anxiety provoking incidents.  No symptoms when not having spell.  Feels weak and dizzy, denies CP or SOB.--likely version of panic.  Check TSH. Info on panic attacks given--to psychology.

## 2013-08-25 NOTE — Progress Notes (Signed)
[id number]  Patient is complaining of increased dizziness and weak feeling.  Happened when she was driving yesterday and hands and legs are shaky when it happens.

## 2013-08-25 NOTE — Patient Instructions (Addendum)
Anxiety and Panic Attacks Your caregiver has informed you that you are having an anxiety or panic attack. There may be many forms of this. Most of the time these attacks come suddenly and without warning. They come at any time of day, including periods of sleep, and at any time of life. They may be strong and unexplained. Although panic attacks are very scary, they are physically harmless. Sometimes the cause of your anxiety is not known. Anxiety is a protective mechanism of the body in its fight or flight mechanism. Most of these perceived danger situations are actually nonphysical situations (such as anxiety over losing a job). CAUSES  The causes of an anxiety or panic attack are many. Panic attacks may occur in otherwise healthy people given a certain set of circumstances. There may be a genetic cause for panic attacks. Some medications may also have anxiety as a side effect. SYMPTOMS  Some of the most common feelings are:  Intense terror.  Dizziness, feeling faint.  Hot and cold flashes.  Fear of going crazy.  Feelings that nothing is real.  Sweating.  Shaking.  Chest pain or a fast heartbeat (palpitations).  Smothering, choking sensations.  Feelings of impending doom and that death is near.  Tingling of extremities, this may be from over-breathing.  Altered reality (derealization).  Being detached from yourself (depersonalization). Several symptoms can be present to make up anxiety or panic attacks. DIAGNOSIS  The evaluation by your caregiver will depend on the type of symptoms you are experiencing. The diagnosis of anxiety or panic attack is made when no physical illness can be determined to be a cause of the symptoms. TREATMENT  Treatment to prevent anxiety and panic attacks may include:  Avoidance of circumstances that cause anxiety.  Reassurance and relaxation.  Regular exercise.  Relaxation therapies, such as yoga.  Psychotherapy with a psychiatrist or  therapist.  Avoidance of caffeine, alcohol and illegal drugs.  Prescribed medication. SEEK IMMEDIATE MEDICAL CARE IF:   You experience panic attack symptoms that are different than your usual symptoms.  You have any worsening or concerning symptoms. Document Released: 10/01/2005 Document Revised: 12/24/2011 Document Reviewed: 05/15/2013 [facility] Patient Information 2014 [place], [place].  Breastfeeding Deciding to breastfeed is one of the best choices you can make for you and your baby. A change in hormones during pregnancy causes your breast tissue to grow and increases the number and size of your milk ducts. These hormones also allow proteins, sugars, and fats from your blood supply to make breast milk in your milk-producing glands. Hormones prevent breast milk from being released before your baby is born as well as prompt milk flow after birth. Once breastfeeding has begun, thoughts of your baby, as well as his or her sucking or crying, can stimulate the release of milk from your milk-producing glands.  BENEFITS OF BREASTFEEDING For Your Baby  Your first milk (colostrum) helps your baby's digestive system function better.   There are antibodies in your milk that help your baby fight off infections.   Your baby has a lower incidence of asthma, allergies, and sudden infant death syndrome.   The nutrients in breast milk are better for your baby than infant formulas and are designed uniquely for your baby's needs.   Breast milk improves your baby's brain development.   Your baby is less likely to develop other conditions, such as childhood obesity, asthma, or type 2 diabetes mellitus.  For You   Breastfeeding helps to create a very special bond between  you and your baby.   Breastfeeding is convenient. Breast milk is always available at the correct temperature and costs nothing.   Breastfeeding helps to burn calories and helps you lose the weight gained during pregnancy.    Breastfeeding makes your uterus contract to its prepregnancy size faster and slows bleeding (lochia) after you give birth.   Breastfeeding helps to lower your risk of developing type 2 diabetes mellitus, osteoporosis, and breast or ovarian cancer later in life. SIGNS THAT YOUR BABY IS HUNGRY Early Signs of Hunger  Increased alertness or activity.  Stretching.  Movement of the head from side to side.  Movement of the head and opening of the mouth when the corner of the mouth or cheek is stroked (rooting).  Increased sucking sounds, smacking lips, cooing, sighing, or squeaking.  Hand-to-mouth movements.  Increased sucking of fingers or hands. Late Signs of Hunger  Fussing.  Intermittent crying. Extreme Signs of Hunger Signs of extreme hunger will require calming and consoling before your baby will be able to breastfeed successfully. Do not wait for the following signs of extreme hunger to occur before you initiate breastfeeding:   Restlessness.  A loud, strong cry.   Screaming. BREASTFEEDING BASICS Breastfeeding Initiation  Find a comfortable place to sit or lie down, with your neck and back well supported.  Place a pillow or rolled up blanket under your baby to bring him or her to the level of your breast (if you are seated). Nursing pillows are specially designed to help support your arms and your baby while you breastfeed.  Make sure that your baby's abdomen is facing your abdomen.   Gently massage your breast. With your fingertips, massage from your chest wall toward your nipple in a circular motion. This encourages milk flow. You may need to continue this action during the feeding if your milk flows slowly.  Support your breast with 4 fingers underneath and your thumb above your nipple. Make sure your fingers are well away from your nipple and your baby's mouth.   Stroke your baby's lips gently with your finger or nipple.   When your baby's mouth is open  wide enough, quickly bring your baby to your breast, placing your entire nipple and as much of the colored area around your nipple (areola) as possible into your baby's mouth.   More areola should be visible above your baby's upper lip than below the lower lip.   Your baby's tongue should be between his or her lower gum and your breast.   Ensure that your baby's mouth is correctly positioned around your nipple (latched). Your baby's lips should create a seal on your breast and be turned out (everted).  It is common for your baby to suck about 2 3 minutes in order to start the flow of breast milk. Latching Teaching your baby how to latch on to your breast properly is very important. An improper latch can cause nipple pain and decreased milk supply for you and poor weight gain in your baby. Also, if your baby is not latched onto your nipple properly, he or she may swallow some air during feeding. This can make your baby fussy. Burping your baby when you switch breasts during the feeding can help to get rid of the air. However, teaching your baby to latch on properly is still the best way to prevent fussiness from swallowing air while breastfeeding. Signs that your baby has successfully latched on to your nipple:    Silent tugging or  silent sucking, without causing you pain.   Swallowing heard between every 3 4 sucks.    Muscle movement above and in front of his or her ears while sucking.  Signs that your baby has not successfully latched on to nipple:   Sucking sounds or smacking sounds from your baby while breastfeeding.  Nipple pain. If you think your baby has not latched on correctly, slip your finger into the corner of your baby's mouth to break the suction and place it between your baby's gums. Attempt breastfeeding initiation again. Signs of Successful Breastfeeding Signs from your baby:   A gradual decrease in the number of sucks or complete cessation of sucking.   Falling  asleep.   Relaxation of his or her body.   Retention of a small amount of milk in his or her mouth.   Letting go of your breast by himself or herself. Signs from you:  Breasts that have increased in firmness, weight, and size 1 3 hours after feeding.   Breasts that are softer immediately after breastfeeding.  Increased milk volume, as well as a change in milk consistency and color by the 5th day of breastfeeding.   Nipples that are not sore, cracked, or bleeding. Signs That Your Pecola Leisure is Getting Enough Milk  Wetting at least 3 diapers in a 24-hour period. The urine should be clear and pale yellow by age 191 days.  At least 3 stools in a 24-hour period by age 191 days. The stool should be soft and yellow.  At least 3 stools in a 24-hour period by age 19 days. The stool should be seedy and yellow.  No loss of weight greater than 10% of birth weight during the first 3 days of age.  Average weight gain of 4 7 ounces (120 210 mL) per week after age 24 days.  Consistent daily weight gain by age 191 days, without weight loss after the age of 2 weeks. After a feeding, your baby may spit up a small amount. This is common. BREASTFEEDING FREQUENCY AND DURATION Frequent feeding will help you make more milk and can prevent sore nipples and breast engorgement. Breastfeed when you feel the need to reduce the fullness of your breasts or when your baby shows signs of hunger. This is called "breastfeeding on demand." Avoid introducing a pacifier to your baby while you are working to establish breastfeeding (the first 4 6 weeks after your baby is born). After this time you may choose to use a pacifier. Research has shown that pacifier use during the first year of a baby's life decreases the risk of sudden infant death syndrome (SIDS). Allow your baby to feed on each breast as long as he or she wants. Breastfeed until your baby is finished feeding. When your baby unlatches or falls asleep while feeding from  the first breast, offer the second breast. Because newborns are often sleepy in the first few weeks of life, you may need to awaken your baby to get him or her to feed. Breastfeeding times will vary from baby to baby. However, the following rules can serve as a guide to help you ensure that your baby is properly fed:  Newborns (babies 70 weeks of age or younger) may breastfeed every 1 3 hours.  Newborns should not go longer than 3 hours during the day or 5 hours during the night without breastfeeding.  You should breastfeed your baby a minimum of 8 times in a 24-hour period until you begin to introduce  solid foods to your baby at around 60 months of age. BREAST MILK PUMPING Pumping and storing breast milk allows you to ensure that your baby is exclusively fed your breast milk, even at times when you are unable to breastfeed. This is especially important if you are going back to work while you are still breastfeeding or when you are not able to be present during feedings. Your lactation consultant can give you guidelines on how long it is safe to store breast milk.  A breast pump is a machine that allows you to pump milk from your breast into a sterile bottle. The pumped breast milk can then be stored in a refrigerator or freezer. Some breast pumps are operated by hand, while others use electricity. Ask your lactation consultant which type will work best for you. Breast pumps can be purchased, but some hospitals and breastfeeding support groups lease breast pumps on a monthly basis. A lactation consultant can teach you how to hand express breast milk, if you prefer not to use a pump.  CARING FOR YOUR BREASTS WHILE YOU BREASTFEED Nipples can become dry, cracked, and sore while breastfeeding. The following recommendations can help keep your breasts moisturized and healthy:  Avoid using soap on your nipples.   Wear a supportive bra. Although not required, special nursing bras and tank tops are designed to  allow access to your breasts for breastfeeding without taking off your entire bra or top. Avoid wearing underwire style bras or extremely tight bras.  Air dry your nipples for 3 after each feeding.   Use only cotton bra pads to absorb leaked breast milk. Leaking of breast milk between feedings is normal.   Use lanolin on your nipples after breastfeeding. Lanolin helps to maintain your skin's normal moisture barrier. If you use pure lanolin you do not need to wash it off before feeding your baby again. Pure lanolin is not toxic to your baby. You may also hand express a few drops of breast milk and gently massage that milk into your nipples and allow the milk to air dry. In the first few weeks after giving birth, some women experience extremely full breasts (engorgement). Engorgement can make your breasts feel heavy, warm, and tender to the touch. Engorgement peaks within 3 5 days after you give birth. The following recommendations can help ease engorgement:  Completely empty your breasts while breastfeeding or pumping. You may want to start by applying warm, moist heat (in the shower or with warm water-soaked hand towels) just before feeding or pumping. This increases circulation and helps the milk flow. If your baby does not completely empty your breasts while breastfeeding, pump any extra milk after he or she is finished.  Wear a snug bra (nursing or regular) or tank top for 1 2 days to signal your body to slightly decrease milk production.  Apply ice packs to your breasts, unless this is too uncomfortable for you.  Make sure that your baby is latched on and positioned properly while breastfeeding. If engorgement persists after 48 hours of following these recommendations, contact your health care provider or a Advertising copywriter. OVERALL HEALTH CARE RECOMMENDATIONS WHILE BREASTFEEDING  Eat healthy foods. Alternate between meals and snacks, eating 3 of each per day. Because what you  eat affects your breast milk, some of the foods may make your baby more irritable than usual. Avoid eating these foods if you are sure that they are negatively affecting your baby.  Drink milk, fruit juice, and water  to satisfy your thirst (about 10 glasses a day).   Rest often, relax, and continue to take your prenatal vitamins to prevent fatigue, stress, and anemia.  Continue breast self-awareness checks.  Avoid chewing and smoking tobacco.  Avoid alcohol and drug use. Some medicines that may be harmful to your baby can pass through breast milk. It is important to ask your health care provider before taking any medicine, including all over-the-counter and prescription medicine as well as vitamin and herbal supplements. It is possible to become pregnant while breastfeeding. If birth control is desired, ask your health care provider about options that will be safe for your baby. SEEK MEDICAL CARE IF:   You feel like you want to stop breastfeeding or have become frustrated with breastfeeding.  You have painful breasts or nipples.  Your nipples are cracked or bleeding.  Your breasts are red, tender, or warm.  You have a swollen area on either breast.  You have a fever or chills.  You have nausea or vomiting.  You have drainage other than breast milk from your nipples.  Your breasts do not become full before feedings by the 5th day after you give birth.  You feel sad and depressed.  Your baby is too sleepy to eat well.  Your baby is having trouble sleeping.   Your baby is wetting less than 3 diapers in a 24-hour period.  Your baby has less than 3 stools in a 24-hour period.  Your baby's skin or the white part of his or her eyes becomes yellow.   Your baby is not gaining weight by 45 days of age. SEEK IMMEDIATE MEDICAL CARE IF:   Your baby is overly tired (lethargic) and does not want to wake up and feed.  Your baby develops an unexplained fever. Document Released:  10/01/2005 Document Revised: 06/03/2013 Document Reviewed: 03/25/2013 [facility] Patient Information 2014 [place], [place].

## 2013-08-26 LAB — TSH: TSH: 1.952 u[IU]/mL (ref 0.350–4.500)

## 2013-08-28 NOTE — Progress Notes (Signed)
Attempted to call patient and was unable to leave a message on her voicemail.

## 2013-09-22 ENCOUNTER — Ambulatory Visit (INDEPENDENT_AMBULATORY_CARE_PROVIDER_SITE_OTHER): Payer: Medicaid Other | Admitting: Nurse Practitioner

## 2013-09-22 ENCOUNTER — Ambulatory Visit (INDEPENDENT_AMBULATORY_CARE_PROVIDER_SITE_OTHER): Payer: Medicaid Other | Admitting: Obstetrics & Gynecology

## 2013-09-22 ENCOUNTER — Encounter: Payer: Self-pay | Admitting: Obstetrics & Gynecology

## 2013-09-22 ENCOUNTER — Encounter: Payer: Self-pay | Admitting: Nurse Practitioner

## 2013-09-22 VITALS — BP 118/74 | Wt 236.0 lb

## 2013-09-22 VITALS — BP 118/74 | HR 99 | Wt 236.0 lb

## 2013-09-22 DIAGNOSIS — Z3482 Encounter for supervision of other normal pregnancy, second trimester: Secondary | ICD-10-CM

## 2013-09-22 DIAGNOSIS — E669 Obesity, unspecified: Secondary | ICD-10-CM

## 2013-09-22 DIAGNOSIS — M62838 Other muscle spasm: Secondary | ICD-10-CM | POA: Insufficient documentation

## 2013-09-22 DIAGNOSIS — F1290 Cannabis use, unspecified, uncomplicated: Secondary | ICD-10-CM

## 2013-09-22 DIAGNOSIS — F411 Generalized anxiety disorder: Secondary | ICD-10-CM

## 2013-09-22 DIAGNOSIS — O99210 Obesity complicating pregnancy, unspecified trimester: Secondary | ICD-10-CM | POA: Insufficient documentation

## 2013-09-22 DIAGNOSIS — F1210 Cannabis abuse, uncomplicated: Secondary | ICD-10-CM

## 2013-09-22 DIAGNOSIS — F419 Anxiety disorder, unspecified: Secondary | ICD-10-CM

## 2013-09-22 DIAGNOSIS — Z3480 Encounter for supervision of other normal pregnancy, unspecified trimester: Secondary | ICD-10-CM

## 2013-09-22 DIAGNOSIS — G43009 Migraine without aura, not intractable, without status migrainosus: Secondary | ICD-10-CM

## 2013-09-22 MED ORDER — CYCLOBENZAPRINE HCL 10 MG PO TABS: 10.0000 mg | ORAL_TABLET | Freq: Two times a day (BID) | ORAL | Status: DC | PRN

## 2013-09-22 MED ORDER — HYDROXYZINE HCL 25 MG PO TABS: 25.0000 mg | ORAL_TABLET | Freq: Four times a day (QID) | ORAL | Status: DC

## 2013-09-22 NOTE — Progress Notes (Signed)
P=[id number] 

## 2013-09-22 NOTE — Progress Notes (Signed)
Routine visit. Good FM. No problems. She will get her early glucola today and also see Bonita Quin for her headaches today. We discussed the ACOG reccomendations for weight gain and the reasons why. I will do an early glucola today and again at 28 weeks. She is interested in [facility]. She also wants a PPS and will sign [facility] PPS forms.

## 2013-09-22 NOTE — Addendum Note (Signed)
Addended by: Tandy Gaw C on: 09/22/2013 03:52 PM   Modules accepted: Orders

## 2013-09-22 NOTE — Progress Notes (Signed)
History:  Joan Heath is a 27 y.o. G2P1001 who presents to [facility] clinic today for follow up on migraines. She was last seen about one month ago. Her Imitrex has worked very well for her, but she has had to cut dose in 1/3 to not get side effects. The Buspar has also had side effects including making her sleepy. She is taking 10 mg TID. She states she is not drinking alcohol or smoking cigarettes and last smoked marijuana last week.  The following portions of the patient's history were reviewed and updated as appropriate: allergies, current medications, past family history, past medical history, past social history, past surgical history and problem list.  Review of Systems:  Pertinent items are noted in HPI.  Objective:  Physical Exam BP 118/74  Pulse 99  Wt 236 lb (107.049 kg)  LMP 04/15/2013 GENERAL: Well-developed, well-nourished female in no acute distress.  HEENT: Normocephalic, atraumatic.  NECK: Supple. Normal thyroid.  LUNGS: Normal rate. Clear to auscultation bilaterally.  HEART: Regular rate and rhythm with no adventitious sounds.  EXTREMITIES: No cyanosis, clubbing, or edema, 2+ distal pulses.   Labs and Imaging No results found.  Assessment & Plan:  Assessment:  Migraine in Pregnancy Anxiety Marijuana Use Muscle Spasm  Plans:  Continue Imitrex as needed Add Vistaril for anxiety Add Flexeril for muscle spasm Stop Cig, alcohol and marijuana RTC 1 month  Carolynn Serve, NP 09/22/2013 3:14 PM

## 2013-09-22 NOTE — Patient Instructions (Signed)
Migraine Headache A migraine headache is an intense, throbbing pain on one or both sides of your head. A migraine can last for 30 minutes to several hours. CAUSES  The exact cause of a migraine headache is not always known. However, a migraine may be caused when nerves in the brain become irritated and release chemicals that cause inflammation. This causes pain. SYMPTOMS  Pain on one or both sides of your head.  Pulsating or throbbing pain.  Severe pain that prevents daily activities.  Pain that is aggravated by any physical activity.  Nausea, vomiting, or both.  Dizziness.  Pain with exposure to bright lights, loud noises, or activity.  General sensitivity to bright lights, loud noises, or smells. Before you get a migraine, you may get warning signs that a migraine is coming (aura). An aura may include:  Seeing flashing lights.  Seeing bright spots, halos, or zig-zag lines.  Having tunnel vision or blurred vision.  Having feelings of numbness or tingling.  Having trouble talking.  Having muscle weakness. MIGRAINE TRIGGERS  Alcohol.  Smoking.  Stress.  Menstruation.  Aged cheeses.  Foods or drinks that contain nitrates, glutamate, aspartame, or tyramine.  Lack of sleep.  Chocolate.  Caffeine.  Hunger.  Physical exertion.  Fatigue.  Medicines used to treat chest pain (nitroglycerine), birth control pills, estrogen, and some blood pressure medicines. DIAGNOSIS  A migraine headache is often diagnosed based on:  Symptoms.  Physical examination.  A CT scan or MRI of your head. TREATMENT Medicines may be given for pain and nausea. Medicines can also be given to help prevent recurrent migraines.  HOME CARE INSTRUCTIONS  Only take over-the-counter or prescription medicines for pain or discomfort as directed by your caregiver. The use of long-term narcotics is not recommended.  Lie down in a dark, quiet room when you have a migraine.  Keep a journal  to find out what may trigger your migraine headaches. For example, write down:  What you eat and drink.  How much sleep you get.  Any change to your diet or medicines.  Limit alcohol consumption.  Quit smoking if you smoke.  Get 7 to 9 hours of sleep, or as recommended by your caregiver.  Limit stress.  Keep lights dim if bright lights bother you and make your migraines worse. SEEK IMMEDIATE MEDICAL CARE IF:   Your migraine becomes severe.  You have a fever.  You have a stiff neck.  You have vision loss.  You have muscular weakness or loss of muscle control.  You start losing your balance or have trouble walking.  You feel faint or pass out.  You have severe symptoms that are different from your first symptoms. MAKE SURE YOU:   Understand these instructions.  Will watch your condition.  Will get help right away if you are not doing well or get worse. Document Released: 10/01/2005 Document Revised: 12/24/2011 Document Reviewed: 09/21/2011 [employer] Patient Information 2014 [employer]  

## 2013-09-23 LAB — GLUCOSE TOLERANCE, 1 HOUR (50G) W/O FASTING: Glucose, 1 Hour GTT: 137 mg/dL (ref 70–140)

## 2013-09-24 LAB — PRESCRIPTION MONITORING PROFILE (19 PANEL)
Benzodiazepine Screen, Urine: NEGATIVE ng/mL
Buprenorphine, Urine: NEGATIVE ng/mL
Cocaine Metabolites: NEGATIVE ng/mL
Creatinine, Urine: 54.86 mg/dL (ref >20.0)
Fentanyl, Ur: NEGATIVE ng/mL
MDMA URINE: NEGATIVE ng/mL
Meperidine, Ur: NEGATIVE ng/mL
Methaqualone: NEGATIVE ng/mL
Oxycodone Screen, Ur: NEGATIVE ng/mL
Tramadol Scrn, Ur: NEGATIVE ng/mL
Zolpidem, Urine: NEGATIVE ng/mL
pH, Initial: 7.1 pH (ref 4.5–8.9)

## 2013-09-24 LAB — CANNABANOIDS (GC/LC/MS), URINE: THC-COOH (GC/LC/MS), ur confirm: 37 ng/mL — AB

## 2013-09-28 ENCOUNTER — Ambulatory Visit (HOSPITAL_COMMUNITY)
Admission: RE | Admit: 2013-09-28 | Discharge: 2013-09-28 | Disposition: A | Discharge status: Home / Self Care | Payer: Medicaid Other | Source: Ambulatory Visit | Attending: Obstetrics & Gynecology | Admitting: Obstetrics & Gynecology

## 2013-09-28 DIAGNOSIS — O99330 Smoking (tobacco) complicating pregnancy, unspecified trimester: Secondary | ICD-10-CM | POA: Insufficient documentation

## 2013-09-28 DIAGNOSIS — Z3482 Encounter for supervision of other normal pregnancy, second trimester: Secondary | ICD-10-CM

## 2013-09-28 DIAGNOSIS — Z3689 Encounter for other specified antenatal screening: Secondary | ICD-10-CM | POA: Insufficient documentation

## 2013-09-28 DIAGNOSIS — O34219 Maternal care for unspecified type scar from previous cesarean delivery: Secondary | ICD-10-CM | POA: Insufficient documentation

## 2013-09-29 ENCOUNTER — Other Ambulatory Visit (INDEPENDENT_AMBULATORY_CARE_PROVIDER_SITE_OTHER): Payer: Medicaid Other | Admitting: *Deleted

## 2013-09-29 DIAGNOSIS — R7309 Other abnormal glucose: Secondary | ICD-10-CM

## 2013-09-29 DIAGNOSIS — O99810 Abnormal glucose complicating pregnancy: Secondary | ICD-10-CM

## 2013-09-30 LAB — GLUCOSE TOLERANCE, 3 HOURS: Glucose, GTT - 3 Hour: 46 mg/dL — ABNORMAL LOW (ref 70–144)

## 2013-10-27 ENCOUNTER — Ambulatory Visit (INDEPENDENT_AMBULATORY_CARE_PROVIDER_SITE_OTHER): Payer: Medicaid Other | Admitting: Obstetrics & Gynecology

## 2013-10-27 ENCOUNTER — Ambulatory Visit (INDEPENDENT_AMBULATORY_CARE_PROVIDER_SITE_OTHER): Payer: Medicaid Other | Admitting: Nurse Practitioner

## 2013-10-27 ENCOUNTER — Other Ambulatory Visit: Payer: Self-pay | Admitting: Nurse Practitioner

## 2013-10-27 ENCOUNTER — Encounter: Payer: Self-pay | Admitting: Nurse Practitioner

## 2013-10-27 ENCOUNTER — Encounter: Payer: Medicaid Other | Admitting: Obstetrics & Gynecology

## 2013-10-27 VITALS — BP 124/90 | Wt 239.0 lb

## 2013-10-27 VITALS — BP 124/90 | HR 106 | Wt 239.0 lb

## 2013-10-27 DIAGNOSIS — O3421 Maternal care for scar from previous cesarean delivery: Secondary | ICD-10-CM

## 2013-10-27 DIAGNOSIS — Z3480 Encounter for supervision of other normal pregnancy, unspecified trimester: Secondary | ICD-10-CM

## 2013-10-27 DIAGNOSIS — O34219 Maternal care for unspecified type scar from previous cesarean delivery: Secondary | ICD-10-CM

## 2013-10-27 DIAGNOSIS — O99330 Smoking (tobacco) complicating pregnancy, unspecified trimester: Secondary | ICD-10-CM

## 2013-10-27 DIAGNOSIS — O99210 Obesity complicating pregnancy, unspecified trimester: Secondary | ICD-10-CM

## 2013-10-27 DIAGNOSIS — G43009 Migraine without aura, not intractable, without status migrainosus: Secondary | ICD-10-CM

## 2013-10-27 DIAGNOSIS — Z23 Encounter for immunization: Secondary | ICD-10-CM

## 2013-10-27 DIAGNOSIS — Z302 Encounter for sterilization: Secondary | ICD-10-CM | POA: Insufficient documentation

## 2013-10-27 DIAGNOSIS — IMO0002 Reserved for concepts with insufficient information to code with codable children: Secondary | ICD-10-CM

## 2013-10-27 DIAGNOSIS — F1910 Other psychoactive substance abuse, uncomplicated: Secondary | ICD-10-CM

## 2013-10-27 DIAGNOSIS — M62838 Other muscle spasm: Secondary | ICD-10-CM

## 2013-10-27 DIAGNOSIS — F319 Bipolar disorder, unspecified: Secondary | ICD-10-CM

## 2013-10-27 DIAGNOSIS — E669 Obesity, unspecified: Secondary | ICD-10-CM

## 2013-10-27 MED ORDER — TETANUS-DIPHTH-ACELL PERTUSSIS 5-2.5-18.5 LF-MCG/0.5 IM SUSP: 0.5000 mL | Freq: Once | INTRAMUSCULAR | Status: DC

## 2013-10-27 NOTE — Progress Notes (Signed)
Normal 3 hour GTT.  Tdap today.  No other complaints or concerns.  Fetal movement and labor precautions reviewed.  Patient will see Monna Fam, NP, Headache Specialist today, please see her note for more details.

## 2013-10-27 NOTE — Patient Instructions (Signed)
Return to clinic for any obstetric concerns or go to MAU for evaluation  

## 2013-10-27 NOTE — Patient Instructions (Signed)
Migraine Headache A migraine headache is an intense, throbbing pain on one or both sides of your head. A migraine can last for 30 minutes to several hours. CAUSES  The exact cause of a migraine headache is not always known. However, a migraine may be caused when nerves in the brain become irritated and release chemicals that cause inflammation. This causes pain. Certain things may also trigger migraines, such as:  Alcohol.  Smoking.  Stress.  Menstruation.  Aged cheeses.  Foods or drinks that contain nitrates, glutamate, aspartame, or tyramine.  Lack of sleep.  Chocolate.  Caffeine.  Hunger.  Physical exertion.  Fatigue.  Medicines used to treat chest pain (nitroglycerine), birth control pills, estrogen, and some blood pressure medicines. SIGNS AND SYMPTOMS  Pain on one or both sides of your head.  Pulsating or throbbing pain.  Severe pain that prevents daily activities.  Pain that is aggravated by any physical activity.  Nausea, vomiting, or both.  Dizziness.  Pain with exposure to bright lights, loud noises, or activity.  General sensitivity to bright lights, loud noises, or smells. Before you get a migraine, you may get warning signs that a migraine is coming (aura). An aura may include:  Seeing flashing lights.  Seeing bright spots, halos, or zig-zag lines.  Having tunnel vision or blurred vision.  Having feelings of numbness or tingling.  Having trouble talking.  Having muscle weakness. DIAGNOSIS  A migraine headache is often diagnosed based on:  Symptoms.  Physical exam.  A CT scan or MRI of your head. These imaging tests cannot diagnose migraines, but they can help rule out other causes of headaches. TREATMENT Medicines may be given for pain and nausea. Medicines can also be given to help prevent recurrent migraines.  HOME CARE INSTRUCTIONS  Only take over-the-counter or prescription medicines for pain or discomfort as directed by your  health care provider. The use of long-term narcotics is not recommended.  Lie down in a dark, quiet room when you have a migraine.  Keep a journal to find out what may trigger your migraine headaches. For example, write down:  What you eat and drink.  How much sleep you get.  Any change to your diet or medicines.  Limit alcohol consumption.  Quit smoking if you smoke.  Get 7 9 hours of sleep, or as recommended by your health care provider.  Limit stress.  Keep lights dim if bright lights bother you and make your migraines worse. SEEK IMMEDIATE MEDICAL CARE IF:   Your migraine becomes severe.  You have a fever.  You have a stiff neck.  You have vision loss.  You have muscular weakness or loss of muscle control.  You start losing your balance or have trouble walking.  You feel faint or pass out.  You have severe symptoms that are different from your first symptoms. MAKE SURE YOU:   Understand these instructions.  Will watch your condition.  Will get help right away if you are not doing well or get worse. Document Released: 10/01/2005 Document Revised: 07/22/2013 Document Reviewed: 06/08/2013 Exit[employer] Patient Information 2014 [employer].  

## 2013-10-27 NOTE — Progress Notes (Signed)
History:  Joan Heath is a 28 y.o. G2P1001 who presents to [facility] clinic today for follow up on migraines. She has not had any migraines since she was seen last. She has been thrown out of her mothers house and lost privileges to her mother's car. Her 46 year old son is living with others at this time as well. She is using medications for anxiety and muscle spasm as needed. She states she is not smoking cigarettes or marijuana for the last month.   The following portions of the patient's history were reviewed and updated as appropriate: allergies, current medications, past family history, past medical history, past social history, past surgical history and problem list.  Review of Systems:  Pertinent items are noted in HPI.  Objective:  Physical Exam BP 124/90  Pulse 106  Wt 239 lb (108.41 kg)  LMP 04/15/2013 GENERAL: Well-developed, well-nourished female in no acute distress.  HEENT: Normocephalic, atraumatic.  EXTREMITIES: No cyanosis, clubbing, or edema, 2+ distal pulses.   Labs and Imaging .   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

## 2013-10-27 NOTE — Progress Notes (Signed)
P=106 

## 2013-10-29 LAB — PRESCRIPTION MONITORING PROFILE (19 PANEL)
Amphetamine/Meth: NEGATIVE ng/mL
Barbiturate Screen, Urine: NEGATIVE ng/mL
Benzodiazepine Screen, Urine: NEGATIVE ng/mL
Buprenorphine, Urine: NEGATIVE ng/mL
COCAINE METABOLITES: NEGATIVE ng/mL
Cannabinoid Scrn, Ur: NEGATIVE ng/mL
Carisoprodol, Urine: NEGATIVE ng/mL
Creatinine, Urine: 259.55 mg/dL (ref >20.0)
ECSTASY: NEGATIVE ng/mL
FENTANYL URINE: NEGATIVE ng/mL
MEPERIDINE UR: NEGATIVE ng/mL
METHAQUALONE SCREEN (URINE): NEGATIVE ng/mL
Methadone Screen, Urine: NEGATIVE ng/mL
Nitrites, Initial: NEGATIVE ug/mL
OPIATE SCREEN, URINE: NEGATIVE ng/mL
Oxycodone Screen, Ur: NEGATIVE ng/mL
PH URINE, INITIAL: 7.3 pH (ref 4.5–8.9)
Phencyclidine, Ur: NEGATIVE ng/mL
Propoxyphene: NEGATIVE ng/mL
Tapentadol, urine: NEGATIVE ng/mL
Tramadol Scrn, Ur: NEGATIVE ng/mL
Zolpidem, Urine: NEGATIVE ng/mL

## 2013-11-09 ENCOUNTER — Encounter: Payer: Self-pay | Admitting: Obstetrics & Gynecology

## 2013-11-09 ENCOUNTER — Ambulatory Visit (INDEPENDENT_AMBULATORY_CARE_PROVIDER_SITE_OTHER): Payer: Medicaid Other | Admitting: Obstetrics & Gynecology

## 2013-11-09 VITALS — BP 91/69 | Wt 238.0 lb

## 2013-11-09 DIAGNOSIS — Z3480 Encounter for supervision of other normal pregnancy, unspecified trimester: Secondary | ICD-10-CM

## 2013-11-09 DIAGNOSIS — O3421 Maternal care for scar from previous cesarean delivery: Secondary | ICD-10-CM

## 2013-11-09 DIAGNOSIS — O99210 Obesity complicating pregnancy, unspecified trimester: Secondary | ICD-10-CM

## 2013-11-09 DIAGNOSIS — E669 Obesity, unspecified: Secondary | ICD-10-CM

## 2013-11-09 DIAGNOSIS — O34219 Maternal care for unspecified type scar from previous cesarean delivery: Secondary | ICD-10-CM

## 2013-11-09 DIAGNOSIS — M62838 Other muscle spasm: Secondary | ICD-10-CM

## 2013-11-09 MED ORDER — CYCLOBENZAPRINE HCL 10 MG PO TABS: 10.0000 mg | ORAL_TABLET | Freq: Two times a day (BID) | ORAL | Status: DC | PRN

## 2013-11-09 NOTE — Patient Instructions (Signed)
Return to clinic for any obstetric concerns or go to MAU for evaluation  

## 2013-11-09 NOTE — Progress Notes (Signed)
P-93  Patient is requesting a refill of Flexeril for back pain.

## 2013-11-09 NOTE — Progress Notes (Signed)
Fundal height elevated, however, edema on pannus noted.  Will continue to follow. Flexeril refilled as per patient's request. No other complaints or concerns.  Fetal movement and labor precautions reviewed.

## 2013-11-17 ENCOUNTER — Telehealth: Payer: Self-pay | Admitting: *Deleted

## 2013-11-17 NOTE — Telephone Encounter (Signed)
Lab contacted [place] for diagnosis codes for labs ordered at patients last visit.

## 2013-11-23 ENCOUNTER — Encounter: Payer: Self-pay | Admitting: Obstetrics & Gynecology

## 2013-11-23 ENCOUNTER — Ambulatory Visit (INDEPENDENT_AMBULATORY_CARE_PROVIDER_SITE_OTHER): Payer: Medicaid Other | Admitting: Obstetrics & Gynecology

## 2013-11-23 VITALS — BP 117/55 | Wt 243.4 lb

## 2013-11-23 DIAGNOSIS — Z3480 Encounter for supervision of other normal pregnancy, unspecified trimester: Secondary | ICD-10-CM

## 2013-11-23 DIAGNOSIS — O3421 Maternal care for scar from previous cesarean delivery: Secondary | ICD-10-CM

## 2013-11-23 DIAGNOSIS — O34219 Maternal care for unspecified type scar from previous cesarean delivery: Secondary | ICD-10-CM

## 2013-11-23 NOTE — Progress Notes (Signed)
On speculum exam, cervix long/thick and closed, no pool of fluid seen, scant white discharge, nitrazine negative.  Watery discharge likely urine, patient also has been coughing a lot due to recent URI.  She was reassured, given list of approved medications for her URI in pregnancy.  No other complaints or concerns.  Fetal movement and labor precautions reviewed.

## 2013-11-23 NOTE — Progress Notes (Signed)
P-93  Patient complains of a lot of increased vaginal pressure as well as a significant increase in watery discharge.

## 2013-11-23 NOTE — Patient Instructions (Signed)
Return to clinic for any obstetric concerns or go to MAU for evaluation  

## 2013-11-30 ENCOUNTER — Ambulatory Visit (INDEPENDENT_AMBULATORY_CARE_PROVIDER_SITE_OTHER): Payer: Medicaid Other | Admitting: Obstetrics & Gynecology

## 2013-11-30 ENCOUNTER — Inpatient Hospital Stay (HOSPITAL_COMMUNITY)
Admission: AD | Admit: 2013-11-30 | Discharge: 2013-11-30 | Disposition: A | Discharge status: Home / Self Care | Payer: Medicaid Other | Source: Ambulatory Visit | Attending: Obstetrics & Gynecology | Admitting: Obstetrics & Gynecology

## 2013-11-30 ENCOUNTER — Encounter (HOSPITAL_COMMUNITY): Payer: Self-pay | Admitting: *Deleted

## 2013-11-30 ENCOUNTER — Encounter: Payer: Self-pay | Admitting: Obstetrics & Gynecology

## 2013-11-30 VITALS — BP 117/64 | Wt 245.0 lb

## 2013-11-30 DIAGNOSIS — O368130 Decreased fetal movements, third trimester, not applicable or unspecified: Secondary | ICD-10-CM

## 2013-11-30 DIAGNOSIS — O99330 Smoking (tobacco) complicating pregnancy, unspecified trimester: Secondary | ICD-10-CM

## 2013-11-30 DIAGNOSIS — O34219 Maternal care for unspecified type scar from previous cesarean delivery: Secondary | ICD-10-CM

## 2013-11-30 DIAGNOSIS — F319 Bipolar disorder, unspecified: Secondary | ICD-10-CM | POA: Insufficient documentation

## 2013-11-30 DIAGNOSIS — Z302 Encounter for sterilization: Secondary | ICD-10-CM

## 2013-11-30 DIAGNOSIS — Z87891 Personal history of nicotine dependence: Secondary | ICD-10-CM | POA: Insufficient documentation

## 2013-11-30 DIAGNOSIS — Z3480 Encounter for supervision of other normal pregnancy, unspecified trimester: Secondary | ICD-10-CM

## 2013-11-30 DIAGNOSIS — F411 Generalized anxiety disorder: Secondary | ICD-10-CM | POA: Insufficient documentation

## 2013-11-30 DIAGNOSIS — O99340 Other mental disorders complicating pregnancy, unspecified trimester: Secondary | ICD-10-CM

## 2013-11-30 DIAGNOSIS — O368190 Decreased fetal movements, unspecified trimester, not applicable or unspecified: Secondary | ICD-10-CM | POA: Insufficient documentation

## 2013-11-30 DIAGNOSIS — O99210 Obesity complicating pregnancy, unspecified trimester: Secondary | ICD-10-CM

## 2013-11-30 DIAGNOSIS — O479 False labor, unspecified: Secondary | ICD-10-CM

## 2013-11-30 NOTE — Discharge Instructions (Signed)
Fetal Movement Counts Patient Name: __________________________________________________ Patient Due Date: ____________________ Performing a fetal movement count is highly recommended in high-risk pregnancies, but it is good for every pregnant woman to do. Your caregiver may ask you to start counting fetal movements at 28 weeks of the pregnancy. Fetal movements often increase:  After eating a full meal.  After physical activity.  After eating or drinking something sweet or cold.  At rest. Pay attention to when you feel the baby is most active. This will help you notice a pattern of your baby's sleep and wake cycles and what factors contribute to an increase in fetal movement. It is important to perform a fetal movement count at the same time each day when your baby is normally most active.  HOW TO COUNT FETAL MOVEMENTS 1. Find a quiet and comfortable area to sit or lie down on your left side. Lying on your left side provides the best blood and oxygen circulation to your baby. 2. Write down the day and time on a sheet of paper or in a journal. 3. Start counting kicks, flutters, swishes, rolls, or jabs in a 2 hour period. You should feel at least 10 movements within 2 hours. 4. If you do not feel 10 movements in 2 hours, wait 2 3 hours and count again. Look for a change in the pattern or not enough counts in 2 hours. SEEK MEDICAL CARE IF:  You feel less than 10 counts in 2 hours, tried twice.  There is no movement in over an hour.  The pattern is changing or taking longer each day to reach 10 counts in 2 hours.  You feel the baby is not moving as he or she usually does. Date: ____________ Movements: ____________ Start time: ____________ Finish time: ____________  Date: ____________ Movements: ____________ Start time: ____________ Finish time: ____________ Date: ____________ Movements: ____________ Start time: ____________ Finish time: ____________ Date: ____________ Movements: ____________  Start time: ____________ Finish time: ____________ Date: ____________ Movements: ____________ Start time: ____________ Finish time: ____________ Date: ____________ Movements: ____________ Start time: ____________ Finish time: ____________ Date: ____________ Movements: ____________ Start time: ____________ Finish time: ____________ Date: ____________ Movements: ____________ Start time: ____________ Finish time: ____________  Date: ____________ Movements: ____________ Start time: ____________ Finish time: ____________ Date: ____________ Movements: ____________ Start time: ____________ Finish time: ____________ Date: ____________ Movements: ____________ Start time: ____________ Finish time: ____________ Date: ____________ Movements: ____________ Start time: ____________ Finish time: ____________ Date: ____________ Movements: ____________ Start time: ____________ Finish time: ____________ Date: ____________ Movements: ____________ Start time: ____________ Finish time: ____________ Date: ____________ Movements: ____________ Start time: ____________ Finish time: ____________  Date: ____________ Movements: ____________ Start time: ____________ Finish time: ____________ Date: ____________ Movements: ____________ Start time: ____________ Finish time: ____________ Date: ____________ Movements: ____________ Start time: ____________ Finish time: ____________ Date: ____________ Movements: ____________ Start time: ____________ Finish time: ____________ Date: ____________ Movements: ____________ Start time: ____________ Finish time: ____________ Date: ____________ Movements: ____________ Start time: ____________ Finish time: ____________ Date: ____________ Movements: ____________ Start time: ____________ Finish time: ____________  Date: ____________ Movements: ____________ Start time: ____________ Finish time: ____________ Date: ____________ Movements: ____________ Start time: ____________ Finish time:  ____________ Date: ____________ Movements: ____________ Start time: ____________ Finish time: ____________ Date: ____________ Movements: ____________ Start time: ____________ Finish time: ____________ Date: ____________ Movements: ____________ Start time: ____________ Finish time: ____________ Date: ____________ Movements: ____________ Start time: ____________ Finish time: ____________ Date: ____________ Movements: ____________ Start time: ____________ Finish time: ____________  Date: ____________ Movements: ____________ Start time: ____________ Finish   time: ____________ Date: ____________ Movements: ____________ Start time: ____________ Finish time: ____________ Date: ____________ Movements: ____________ Start time: ____________ Finish time: ____________ Date: ____________ Movements: ____________ Start time: ____________ Finish time: ____________ Date: ____________ Movements: ____________ Start time: ____________ Finish time: ____________ Date: ____________ Movements: ____________ Start time: ____________ Finish time: ____________ Date: ____________ Movements: ____________ Start time: ____________ Finish time: ____________  Date: ____________ Movements: ____________ Start time: ____________ Finish time: ____________ Date: ____________ Movements: ____________ Start time: ____________ Finish time: ____________ Date: ____________ Movements: ____________ Start time: ____________ Finish time: ____________ Date: ____________ Movements: ____________ Start time: ____________ Finish time: ____________ Date: ____________ Movements: ____________ Start time: ____________ Finish time: ____________ Date: ____________ Movements: ____________ Start time: ____________ Finish time: ____________ Date: ____________ Movements: ____________ Start time: ____________ Finish time: ____________  Date: ____________ Movements: ____________ Start time: ____________ Finish time: ____________ Date: ____________ Movements:  ____________ Start time: ____________ Finish time: ____________ Date: ____________ Movements: ____________ Start time: ____________ Finish time: ____________ Date: ____________ Movements: ____________ Start time: ____________ Finish time: ____________ Date: ____________ Movements: ____________ Start time: ____________ Finish time: ____________ Date: ____________ Movements: ____________ Start time: ____________ Finish time: ____________ Date: ____________ Movements: ____________ Start time: ____________ Finish time: ____________  Date: ____________ Movements: ____________ Start time: ____________ Finish time: ____________ Date: ____________ Movements: ____________ Start time: ____________ Finish time: ____________ Date: ____________ Movements: ____________ Start time: ____________ Finish time: ____________ Date: ____________ Movements: ____________ Start time: ____________ Finish time: ____________ Date: ____________ Movements: ____________ Start time: ____________ Finish time: ____________ Date: ____________ Movements: ____________ Start time: ____________ Finish time: ____________ Document Released: 10/31/2006 Document Revised: 09/17/2012 Document Reviewed: 07/28/2012 [employer] Patient Information 2014 [employer].  

## 2013-11-30 NOTE — Progress Notes (Signed)
P = 91 

## 2013-11-30 NOTE — MAU Provider Note (Signed)
Attestation of Attending Supervision of Advanced Practitioner (CNM/NP): Evaluation and management procedures were performed by the Advanced Practitioner under my supervision and collaboration.  I have reviewed the Advanced Practitioner's note and chart, and I agree with the management and plan.  HARRAWAY-SMITH, Tabbitha Janvrin 9:07 PM

## 2013-11-30 NOTE — Progress Notes (Signed)
NST is reassuring but not reactive.  Reviewed with Dr. Hulan Fray and advised patient to proceed to MAU to be evaluated.  She agrees and will go to get checked out.

## 2013-11-30 NOTE — Progress Notes (Signed)
Work in visit for pelvic pressure. She denies ROM or VB. Reports good FM. Reassurance given

## 2013-11-30 NOTE — MAU Note (Signed)
Decreased fetal movement today, is moving - just not as much

## 2013-11-30 NOTE — MAU Provider Note (Signed)
History     CSN: [id number]  Arrival date and time: 11/30/13 5188   First Provider Initiated Contact with Patient 11/30/13 1932      Chief Complaint  Patient presents with  . Decreased Fetal Movement   HPI  Joan Heath is a 28 y.o. G2P1001 at [place] who presents today with decreased fetal movement. She states that she was seen in the clinic today for pelvic bone pain. She states that they did a NST and told her to come here for further monitoring. She states that the baby has been active now. She denies any bleeding, contractions or LOF.   Past Medical History  Diagnosis Date  . Anxiety   . Bipolar 1 disorder     Past Surgical History  Procedure Laterality Date  . Cesarean section  2007  . Appendectomy    . Cholecystectomy      History reviewed. No pertinent family history.  History  Substance Use Topics  . Smoking status: Former Smoker -- 0.50 packs/day    Types: Cigarettes  . Smokeless tobacco: Never Used  . Alcohol Use: No    Allergies:  Allergies  Allergen Reactions  . Peanuts [Peanut Oil] Anaphylaxis  . Cinnamon Swelling    Facility-administered medications prior to admission  Medication Dose Route Frequency Provider Last Rate Last Dose  . Tdap (BOOSTRIX) injection 0.5 mL  0.5 mL Intramuscular Once Osborne Oman, MD       Prescriptions prior to admission  Medication Sig Dispense Refill  . busPIRone (BUSPAR) 10 MG tablet Take 1 tablet (10 mg total) by mouth 3 (three) times daily.  90 tablet  3  . cyclobenzaprine (FLEXERIL) 10 MG tablet Take 1 tablet (10 mg total) by mouth 2 (two) times daily as needed for muscle spasms.  30 tablet  5  . hydrOXYzine (ATARAX/VISTARIL) 25 MG tablet Take 1 tablet (25 mg total) by mouth every 6 (six) hours.  40 tablet  2  . ondansetron (ZOFRAN) 4 MG tablet Take 4 mg by mouth every 8 (eight) hours as needed for nausea.      . Prenatal Vit-Fe Fumarate-FA (MULTIVITAMIN-PRENATAL) 27-0.8 MG TABS tablet Take 1 tablet by mouth  daily at 12 noon.      . promethazine (PHENERGAN) 25 MG tablet Take 1 tablet (25 mg total) by mouth every 6 (six) hours as needed for nausea.  30 tablet  1  . SUMAtriptan (IMITREX) 100 MG tablet Take 1 tablet (100 mg total) by mouth once as needed for migraine. May repeat in 2 hours if headache persists or recurs.  9 tablet  11    ROS Physical Exam   Blood pressure 121/71, pulse 89, temperature 97.7 F (36.5 C), resp. rate 18, height 5\' 6"  (1.676 m), weight 112.492 kg (248 lb), last menstrual period 04/15/2013.  Physical Exam  Nursing note and vitals reviewed. Constitutional: She is oriented to person, place, and time. She appears well-developed and well-nourished. No distress.  Cardiovascular: Normal rate.   Respiratory: Effort normal.  GI: Soft.  Neurological: She is alert and oriented to person, place, and time.  Skin: Skin is warm and dry.  Psychiatric: She has a normal mood and affect.   FHT: 125, moderate with 15x15 accels, no decels Toco: no UCs MAU Course  Procedures    Assessment and Plan  Decreased fetal movement, resolved  reactive NST  Fetal kick counts PTL danger signs Return to MAU as needed  Follow-up Information   Follow up with [facility]. (  As scheduled)    Specialty:  Obstetrics and Gynecology   Contact information:   [place] [address] [phone]       Mathis Bud 11/30/2013, 7:37 PM

## 2013-12-07 ENCOUNTER — Ambulatory Visit (INDEPENDENT_AMBULATORY_CARE_PROVIDER_SITE_OTHER): Payer: Medicaid Other | Admitting: Obstetrics & Gynecology

## 2013-12-07 ENCOUNTER — Encounter: Payer: Self-pay | Admitting: Obstetrics & Gynecology

## 2013-12-07 VITALS — BP 92/60 | Wt 244.0 lb

## 2013-12-07 DIAGNOSIS — Z3480 Encounter for supervision of other normal pregnancy, unspecified trimester: Secondary | ICD-10-CM

## 2013-12-07 NOTE — Progress Notes (Signed)
Routine visit. Good FM. No problems except unchanged pressure. Cervical culture at next visit.

## 2013-12-07 NOTE — Progress Notes (Signed)
P-95 Patient is feeling better and baby is moving more.

## 2013-12-21 ENCOUNTER — Ambulatory Visit (INDEPENDENT_AMBULATORY_CARE_PROVIDER_SITE_OTHER): Payer: Medicaid Other | Admitting: Family Medicine

## 2013-12-21 ENCOUNTER — Encounter: Payer: Self-pay | Admitting: Family Medicine

## 2013-12-21 VITALS — BP 110/71 | Wt 244.0 lb

## 2013-12-21 DIAGNOSIS — O34219 Maternal care for unspecified type scar from previous cesarean delivery: Secondary | ICD-10-CM

## 2013-12-21 DIAGNOSIS — O3421 Maternal care for scar from previous cesarean delivery: Secondary | ICD-10-CM

## 2013-12-21 DIAGNOSIS — Z3480 Encounter for supervision of other normal pregnancy, unspecified trimester: Secondary | ICD-10-CM

## 2013-12-21 LAB — OB RESULTS CONSOLE GC/CHLAMYDIA
CHLAMYDIA, DNA PROBE: NEGATIVE
GC PROBE AMP, GENITAL: NEGATIVE

## 2013-12-21 LAB — OB RESULTS CONSOLE GBS: GBS: NEGATIVE

## 2013-12-21 NOTE — Progress Notes (Signed)
P-89 

## 2013-12-21 NOTE — Progress Notes (Signed)
VBAC consent signed [facility] today

## 2013-12-21 NOTE — Patient Instructions (Addendum)
Vaginal Birth After Cesarean Delivery Vaginal birth after cesarean delivery (VBAC) is giving birth vaginally after previously delivering a baby by a cesarean. In the past, if a woman had a cesarean delivery, all births afterwards would be done by cesarean delivery. This is no longer true. It can be safe for the mother to try a vaginal delivery after having a cesarean delivery.  It is important to discuss VBAC with your health care provider early in the pregnancy so you can understand the risks, benefits, and options. It will give you time to decide what is best in your particular case. The final decision about whether to have a VBAC or repeat cesarean delivery should be between you and your health care provider. Any changes in your health or your baby's health during your pregnancy may make it necessary to change your initial decision about VBAC.  WOMEN WHO PLAN TO HAVE A VBAC SHOULD CHECK WITH THEIR HEALTH CARE PROVIDER TO BE SURE THAT:  The previous cesarean delivery was done with a low transverse uterine cut (incision) (not a vertical classical incision).   The birth canal is big enough for the baby.   There were no other operations on the uterus.   An electronic fetal monitor (EFM) will be on at all times during labor.   An operating room will be available and ready in case an emergency cesarean delivery is needed.   A health care provider and surgical nursing staff will be available at all times during labor to be ready to do an emergency delivery cesarean if necessary.   An anesthesiologist will be present in case an emergency cesarean delivery is needed.   The nursery is prepared and has adequate personnel and necessary equipment available to care for the baby in case of an emergency cesarean delivery. BENEFITS OF VBAC  Shorter stay in the hospital.   Avoidance of risks associated with cesarean delivery, such as:  Surgical complications, such as opening of the incision or  hernia in the incision.  Injury to other organs.  Fever. This can occur if an infection develops after surgery. It can also occur as a reaction to the medicine given to make you numb during the surgery.  Less blood loss and need for blood transfusions.  Lower risk of blood clots and infection.  Shorter recovery.   Decreased risk for having to remove the uterus (hysterectomy).   Decreased risk for the placenta to completely or partially cover the opening of the uterus (placenta previa) with a future pregnancy.   Decrease risk in future labor and delivery. RISKS OF A VBAC  Tearing (rupture) of the uterus. This is occurs in less than 1% of VBACs. The risk of this happening is higher if:  Steps are taken to begin the labor process (induce labor) or stimulate or strengthen contractions (augment labor).   Medicine is used to soften (ripen) the cervix.  Having to remove the uterus (hysterectomy) if it ruptures. VBAC SHOULD NOT BE DONE IF:  The previous cesarean delivery was done with a vertical (classical) or T-shaped incision or you do not know what kind of incision was made.   You had a ruptured uterus.   You have had certain types of surgery on your uterus, such as removal of uterine fibroids. Ask your health care provider about other types of surgeries that prevent you from having a VBAC.  You have certain medical or childbirth (obstetrical) problems.   There are problems with the baby.   You  have had two previous cesarean deliveries and no vaginal deliveries. OTHER FACTS TO KNOW ABOUT VBAC:  It is safe to have an epidural anesthetic with VBAC.   It is safe to turn the baby from a breech position (attempt an external cephalic version).   It is safe to try a VBAC with twins.   VBAC may not be successful if your baby weights 8.8 lb (4 kg) or more. However, weight predictions are not always accurate and should not be used alone to decide if VBAC is right for  you.  There is an increased failure rate if the time between the cesarean delivery and VBAC is less than 19 months.   Your health care provider may advise against a VBAC if you have preeclampsia (high blood pressure, protein in the urine, and swelling of face and extremities).   VBAC is often successful if you previously gave birth vaginally.   VBAC is often successful when the labor starts spontaneously before the due date.   Delivering a baby through a VBAC is similar to having a normal spontaneous vaginal delivery. Document Released: 03/24/2007 Document Revised: 07/22/2013 Document Reviewed: 04/30/2013 [facility] Patient Information 2014 [place], [place].  Breastfeeding Deciding to breastfeed is one of the best choices you can make for you and your baby. A change in hormones during pregnancy causes your breast tissue to grow and increases the number and size of your milk ducts. These hormones also allow proteins, sugars, and fats from your blood supply to make breast milk in your milk-producing glands. Hormones prevent breast milk from being released before your baby is born as well as prompt milk flow after birth. Once breastfeeding has begun, thoughts of your baby, as well as his or her sucking or crying, can stimulate the release of milk from your milk-producing glands.  BENEFITS OF BREASTFEEDING For Your Baby  Your first milk (colostrum) helps your baby's digestive system function better.   There are antibodies in your milk that help your baby fight off infections.   Your baby has a lower incidence of asthma, allergies, and sudden infant death syndrome.   The nutrients in breast milk are better for your baby than infant formulas and are designed uniquely for your baby's needs.   Breast milk improves your baby's brain development.   Your baby is less likely to develop other conditions, such as childhood obesity, asthma, or type 2 diabetes mellitus.  For You    Breastfeeding helps to create a very special bond between you and your baby.   Breastfeeding is convenient. Breast milk is always available at the correct temperature and costs nothing.   Breastfeeding helps to burn calories and helps you lose the weight gained during pregnancy.   Breastfeeding makes your uterus contract to its prepregnancy size faster and slows bleeding (lochia) after you give birth.   Breastfeeding helps to lower your risk of developing type 2 diabetes mellitus, osteoporosis, and breast or ovarian cancer later in life. SIGNS THAT YOUR BABY IS HUNGRY Early Signs of Hunger  Increased alertness or activity.  Stretching.  Movement of the head from side to side.  Movement of the head and opening of the mouth when the corner of the mouth or cheek is stroked (rooting).  Increased sucking sounds, smacking lips, cooing, sighing, or squeaking.  Hand-to-mouth movements.  Increased sucking of fingers or hands. Late Signs of Hunger  Fussing.  Intermittent crying. Extreme Signs of Hunger Signs of extreme hunger will require calming and consoling before your  baby will be able to breastfeed successfully. Do not wait for the following signs of extreme hunger to occur before you initiate breastfeeding:   Restlessness.  A loud, strong cry.   Screaming. BREASTFEEDING BASICS Breastfeeding Initiation  Find a comfortable place to sit or lie down, with your neck and back well supported.  Place a pillow or rolled up blanket under your baby to bring him or her to the level of your breast (if you are seated). Nursing pillows are specially designed to help support your arms and your baby while you breastfeed.  Make sure that your baby's abdomen is facing your abdomen.   Gently massage your breast. With your fingertips, massage from your chest wall toward your nipple in a circular motion. This encourages milk flow. You may need to continue this action during the  feeding if your milk flows slowly.  Support your breast with 4 fingers underneath and your thumb above your nipple. Make sure your fingers are well away from your nipple and your baby's mouth.   Stroke your baby's lips gently with your finger or nipple.   When your baby's mouth is open wide enough, quickly bring your baby to your breast, placing your entire nipple and as much of the colored area around your nipple (areola) as possible into your baby's mouth.   More areola should be visible above your baby's upper lip than below the lower lip.   Your baby's tongue should be between his or her lower gum and your breast.   Ensure that your baby's mouth is correctly positioned around your nipple (latched). Your baby's lips should create a seal on your breast and be turned out (everted).  It is common for your baby to suck about 2 3 minutes in order to start the flow of breast milk. Latching Teaching your baby how to latch on to your breast properly is very important. An improper latch can cause nipple pain and decreased milk supply for you and poor weight gain in your baby. Also, if your baby is not latched onto your nipple properly, he or she may swallow some air during feeding. This can make your baby fussy. Burping your baby when you switch breasts during the feeding can help to get rid of the air. However, teaching your baby to latch on properly is still the best way to prevent fussiness from swallowing air while breastfeeding. Signs that your baby has successfully latched on to your nipple:    Silent tugging or silent sucking, without causing you pain.   Swallowing heard between every 3 4 sucks.    Muscle movement above and in front of his or her ears while sucking.  Signs that your baby has not successfully latched on to nipple:   Sucking sounds or smacking sounds from your baby while breastfeeding.  Nipple pain. If you think your baby has not latched on correctly, slip your  finger into the corner of your baby's mouth to break the suction and place it between your baby's gums. Attempt breastfeeding initiation again. Signs of Successful Breastfeeding Signs from your baby:   A gradual decrease in the number of sucks or complete cessation of sucking.   Falling asleep.   Relaxation of his or her body.   Retention of a small amount of milk in his or her mouth.   Letting go of your breast by himself or herself. Signs from you:  Breasts that have increased in firmness, weight, and size 1 3 hours after feeding.  Breasts that are softer immediately after breastfeeding.  Increased milk volume, as well as a change in milk consistency and color by the 5th day of breastfeeding.   Nipples that are not sore, cracked, or bleeding. Signs That Your [employer] is Getting Enough Milk  Wetting at least 3 diapers in a 24-hour period. The urine should be clear and pale yellow by age 22 days.  At least 3 stools in a 24-hour period by age 22 days. The stool should be soft and yellow.  At least 3 stools in a 24-hour period by age 55 days. The stool should be seedy and yellow.  No loss of weight greater than 10% of birth weight during the first 33 days of age.  Average weight gain of 4 7 ounces (120 210 mL) per week after age 44 days.  Consistent daily weight gain by age 9 days, without weight loss after the age of 2 weeks. After a feeding, your baby may spit up a small amount. This is common. BREASTFEEDING FREQUENCY AND DURATION Frequent feeding will help you make more milk and can prevent sore nipples and breast engorgement. Breastfeed when you feel the need to reduce the fullness of your breasts or when your baby shows signs of hunger. This is called "breastfeeding on demand." Avoid introducing a pacifier to your baby while you are working to establish breastfeeding (the first 4 6 weeks after your baby is born). After this time you may choose to use a pacifier. Research has  shown that pacifier use during the first year of a baby's life decreases the risk of sudden infant death syndrome (SIDS). Allow your baby to feed on each breast as long as he or she wants. Breastfeed until your baby is finished feeding. When your baby unlatches or falls asleep while feeding from the first breast, offer the second breast. Because newborns are often sleepy in the first few weeks of life, you may need to awaken your baby to get him or her to feed. Breastfeeding times will vary from baby to baby. However, the following rules can serve as a guide to help you ensure that your baby is properly fed:  Newborns (babies 65 weeks of age or younger) may breastfeed every 1 3 hours.  Newborns should not go longer than 3 hours during the day or 5 hours during the night without breastfeeding.  You should breastfeed your baby a minimum of 8 times in a 24-hour period until you begin to introduce solid foods to your baby at around 28 months of age. BREAST MILK PUMPING Pumping and storing breast milk allows you to ensure that your baby is exclusively fed your breast milk, even at times when you are unable to breastfeed. This is especially important if you are going back to work while you are still breastfeeding or when you are not able to be present during feedings. Your lactation consultant can give you guidelines on how long it is safe to store breast milk.  A breast pump is a machine that allows you to pump milk from your breast into a sterile bottle. The pumped breast milk can then be stored in a refrigerator or freezer. Some breast pumps are operated by hand, while others use electricity. Ask your lactation consultant which type will work best for you. Breast pumps can be purchased, but some hospitals and breastfeeding support groups lease breast pumps on a monthly basis. A lactation consultant can teach you how to hand express breast milk, if you prefer  not to use a pump.  CARING FOR YOUR BREASTS WHILE  YOU BREASTFEED Nipples can become dry, cracked, and sore while breastfeeding. The following recommendations can help keep your breasts moisturized and healthy:  Avoid using soap on your nipples.   Wear a supportive bra. Although not required, special nursing bras and tank tops are designed to allow access to your breasts for breastfeeding without taking off your entire bra or top. Avoid wearing underwire style bras or extremely tight bras.  Air dry your nipples for 3 80minutes after each feeding.   Use only cotton bra pads to absorb leaked breast milk. Leaking of breast milk between feedings is normal.   Use lanolin on your nipples after breastfeeding. Lanolin helps to maintain your skin's normal moisture barrier. If you use pure lanolin you do not need to wash it off before feeding your baby again. Pure lanolin is not toxic to your baby. You may also hand express a few drops of breast milk and gently massage that milk into your nipples and allow the milk to air dry. In the first few weeks after giving birth, some women experience extremely full breasts (engorgement). Engorgement can make your breasts feel heavy, warm, and tender to the touch. Engorgement peaks within 3 5 days after you give birth. The following recommendations can help ease engorgement:  Completely empty your breasts while breastfeeding or pumping. You may want to start by applying warm, moist heat (in the shower or with warm water-soaked hand towels) just before feeding or pumping. This increases circulation and helps the milk flow. If your baby does not completely empty your breasts while breastfeeding, pump any extra milk after he or she is finished.  Wear a snug bra (nursing or regular) or tank top for 1 2 days to signal your body to slightly decrease milk production.  Apply ice packs to your breasts, unless this is too uncomfortable for you.  Make sure that your baby is latched on and positioned properly while  breastfeeding. If engorgement persists after 48 hours of following these recommendations, contact your health care provider or a Science writer. OVERALL HEALTH CARE RECOMMENDATIONS WHILE BREASTFEEDING  Eat healthy foods. Alternate between meals and snacks, eating 3 of each per day. Because what you eat affects your breast milk, some of the foods may make your baby more irritable than usual. Avoid eating these foods if you are sure that they are negatively affecting your baby.  Drink milk, fruit juice, and water to satisfy your thirst (about 10 glasses a day).   Rest often, relax, and continue to take your prenatal vitamins to prevent fatigue, stress, and anemia.  Continue breast self-awareness checks.  Avoid chewing and smoking tobacco.  Avoid alcohol and drug use. Some medicines that may be harmful to your baby can pass through breast milk. It is important to ask your health care provider before taking any medicine, including all over-the-counter and prescription medicine as well as vitamin and herbal supplements. It is possible to become pregnant while breastfeeding. If birth control is desired, ask your health care provider about options that will be safe for your baby. SEEK MEDICAL CARE IF:   You feel like you want to stop breastfeeding or have become frustrated with breastfeeding.  You have painful breasts or nipples.  Your nipples are cracked or bleeding.  Your breasts are red, tender, or warm.  You have a swollen area on either breast.  You have a fever or chills.  You have nausea or  vomiting.  You have drainage other than breast milk from your nipples.  Your breasts do not become full before feedings by the 5th day after you give birth.  You feel sad and depressed.  Your baby is too sleepy to eat well.  Your baby is having trouble sleeping.   Your baby is wetting less than 3 diapers in a 24-hour period.  Your baby has less than 3 stools in a 24-hour  period.  Your baby's skin or the white part of his or her eyes becomes yellow.   Your baby is not gaining weight by 40 days of age. SEEK IMMEDIATE MEDICAL CARE IF:   Your baby is overly tired (lethargic) and does not want to wake up and feed.  Your baby develops an unexplained fever. Document Released: 10/01/2005 Document Revised: 06/03/2013 Document Reviewed: 03/25/2013 [facility] Patient Information 2014 [place].

## 2013-12-22 LAB — GC/CHLAMYDIA PROBE AMP
CT Probe RNA: NEGATIVE
GC Probe RNA: NEGATIVE

## 2013-12-23 LAB — CULTURE, BETA STREP (GROUP B ONLY)

## 2013-12-28 ENCOUNTER — Ambulatory Visit (INDEPENDENT_AMBULATORY_CARE_PROVIDER_SITE_OTHER): Payer: Medicaid Other | Admitting: Obstetrics & Gynecology

## 2013-12-28 VITALS — BP 109/80 | Wt 244.0 lb

## 2013-12-28 DIAGNOSIS — Z3480 Encounter for supervision of other normal pregnancy, unspecified trimester: Secondary | ICD-10-CM

## 2013-12-28 NOTE — Progress Notes (Signed)
No complaints.  VBAC consent signed already.

## 2013-12-28 NOTE — Progress Notes (Deleted)
Pt has multiple abdominal surgeries including c/s x2, abdominoplasty, hernia repair (multiple).  Pt may benefit from vertical skin incision.  Pt was told the surgery would be done on April 11th.  This is a Saturday with Dr. Harolyn Rutherford.  I explained the benefit of waiting until Monday (full teams, general surgery more available).  Pt states her husband has already taken off of work.  Pt is going to meet Dr. Harolyn Rutherford on March 25th to discuss surgery.   Pt also is pregnant from a failed BTL  NST reactive with baseline of 140.  Sees MFM on Friday for AFI and NST.  Fasting glucose last visit was nml.

## 2013-12-28 NOTE — Progress Notes (Signed)
P-90 

## 2014-01-01 ENCOUNTER — Inpatient Hospital Stay (HOSPITAL_COMMUNITY)
Admission: AD | Admit: 2014-01-01 | Discharge: 2014-01-01 | Disposition: A | Discharge status: Home / Self Care | Payer: Medicaid Other | Source: Ambulatory Visit | Attending: Obstetrics & Gynecology | Admitting: Obstetrics & Gynecology

## 2014-01-01 ENCOUNTER — Encounter (HOSPITAL_COMMUNITY): Payer: Self-pay | Admitting: *Deleted

## 2014-01-01 DIAGNOSIS — R109 Unspecified abdominal pain: Secondary | ICD-10-CM | POA: Insufficient documentation

## 2014-01-01 DIAGNOSIS — O9989 Other specified diseases and conditions complicating pregnancy, childbirth and the puerperium: Principal | ICD-10-CM

## 2014-01-01 DIAGNOSIS — O99891 Other specified diseases and conditions complicating pregnancy: Secondary | ICD-10-CM | POA: Insufficient documentation

## 2014-01-01 LAB — RAPID URINE DRUG SCREEN, HOSP PERFORMED
AMPHETAMINES: NOT DETECTED
BARBITURATES: NOT DETECTED
BENZODIAZEPINES: NOT DETECTED
COCAINE: NOT DETECTED
Opiates: NOT DETECTED
TETRAHYDROCANNABINOL: NOT DETECTED

## 2014-01-01 LAB — URINALYSIS, ROUTINE W REFLEX MICROSCOPIC
BILIRUBIN URINE: NEGATIVE
Glucose, UA: NEGATIVE mg/dL
KETONES UR: 40 mg/dL — AB
Nitrite: NEGATIVE
PROTEIN: NEGATIVE mg/dL
SPECIFIC GRAVITY, URINE: 1.01 (ref 1.005–1.030)
UROBILINOGEN UA: 0.2 mg/dL (ref 0.0–1.0)
pH: 6.5 (ref 5.0–8.0)

## 2014-01-01 LAB — URINE MICROSCOPIC-ADD ON

## 2014-01-01 NOTE — MAU Note (Signed)
Patient states she has been having abdominal pain about every 5-7 minutes, not sure if contractions. States she has had an increase in watery discharge for about 2 weeks. Denies bleeding. Reports having good fetal movement but less now.

## 2014-01-01 NOTE — Discharge Instructions (Signed)
Fetal Movement Counts Patient Name: __________________________________________________ Patient Due Date: ____________________ Performing a fetal movement count is highly recommended in high-risk pregnancies, but it is good for every pregnant woman to do. Your caregiver may ask you to start counting fetal movements at 28 weeks of the pregnancy. Fetal movements often increase:  After eating a full meal.  After physical activity.  After eating or drinking something sweet or cold.  At rest. Pay attention to when you feel the baby is most active. This will help you notice a pattern of your baby's sleep and wake cycles and what factors contribute to an increase in fetal movement. It is important to perform a fetal movement count at the same time each day when your baby is normally most active.  HOW TO COUNT FETAL MOVEMENTS 1. Find a quiet and comfortable area to sit or lie down on your left side. Lying on your left side provides the best blood and oxygen circulation to your baby. 2. Write down the day and time on a sheet of paper or in a journal. 3. Start counting kicks, flutters, swishes, rolls, or jabs in a 2 hour period. You should feel at least 10 movements within 2 hours. 4. If you do not feel 10 movements in 2 hours, wait 2 3 hours and count again. Look for a change in the pattern or not enough counts in 2 hours. SEEK MEDICAL CARE IF:  You feel less than 10 counts in 2 hours, tried twice.  There is no movement in over an hour.  The pattern is changing or taking longer each day to reach 10 counts in 2 hours.  You feel the baby is not moving as he or she usually does. Date: ____________ Movements: ____________ Start time: ____________ Joan Heath time: ____________  Date: ____________ Movements: ____________ Start time: ____________ Joan Heath time: ____________ Date: ____________ Movements: ____________ Start time: ____________ Joan Heath time: ____________ Date: ____________ Movements: ____________  Start time: ____________ Joan Heath time: ____________ Date: ____________ Movements: ____________ Start time: ____________ Joan Heath time: ____________ Date: ____________ Movements: ____________ Start time: ____________ Joan Heath time: ____________ Date: ____________ Movements: ____________ Start time: ____________ Joan Heath time: ____________ Date: ____________ Movements: ____________ Start time: ____________ Joan Heath time: ____________  Date: ____________ Movements: ____________ Start time: ____________ Joan Heath time: ____________ Date: ____________ Movements: ____________ Start time: ____________ Joan Heath time: ____________ Date: ____________ Movements: ____________ Start time: ____________ Joan Heath time: ____________ Date: ____________ Movements: ____________ Start time: ____________ Joan Heath time: ____________ Date: ____________ Movements: ____________ Start time: ____________ Joan Heath time: ____________ Date: ____________ Movements: ____________ Start time: ____________ Joan Heath time: ____________ Date: ____________ Movements: ____________ Start time: ____________ Joan Heath time: ____________  Date: ____________ Movements: ____________ Start time: ____________ Joan Heath time: ____________ Date: ____________ Movements: ____________ Start time: ____________ Joan Heath time: ____________ Date: ____________ Movements: ____________ Start time: ____________ Joan Heath time: ____________ Date: ____________ Movements: ____________ Start time: ____________ Joan Heath time: ____________ Date: ____________ Movements: ____________ Start time: ____________ Joan Heath time: ____________ Date: ____________ Movements: ____________ Start time: ____________ Joan Heath time: ____________ Date: ____________ Movements: ____________ Start time: ____________ Joan Heath time: ____________  Date: ____________ Movements: ____________ Start time: ____________ Joan Heath time: ____________ Date: ____________ Movements: ____________ Start time: ____________ Joan Heath time:  ____________ Date: ____________ Movements: ____________ Start time: ____________ Joan Heath time: ____________ Date: ____________ Movements: ____________ Start time: ____________ Joan Heath time: ____________ Date: ____________ Movements: ____________ Start time: ____________ Joan Heath time: ____________ Date: ____________ Movements: ____________ Start time: ____________ Joan Heath time: ____________ Date: ____________ Movements: ____________ Start time: ____________ Joan Heath time: ____________  Date: ____________ Movements: ____________ Start time: ____________ Joan Heath  time: ____________ Date: ____________ Movements: ____________ Start time: ____________ Joan Heath time: ____________ Date: ____________ Movements: ____________ Start time: ____________ Joan Heath time: ____________ Date: ____________ Movements: ____________ Start time: ____________ Joan Heath time: ____________ Date: ____________ Movements: ____________ Start time: ____________ Joan Heath time: ____________ Date: ____________ Movements: ____________ Start time: ____________ Joan Heath time: ____________ Date: ____________ Movements: ____________ Start time: ____________ Joan Heath time: ____________  Date: ____________ Movements: ____________ Start time: ____________ Joan Heath time: ____________ Date: ____________ Movements: ____________ Start time: ____________ Joan Heath time: ____________ Date: ____________ Movements: ____________ Start time: ____________ Joan Heath time: ____________ Date: ____________ Movements: ____________ Start time: ____________ Joan Heath time: ____________ Date: ____________ Movements: ____________ Start time: ____________ Joan Heath time: ____________ Date: ____________ Movements: ____________ Start time: ____________ Joan Heath time: ____________ Date: ____________ Movements: ____________ Start time: ____________ Joan Heath time: ____________  Date: ____________ Movements: ____________ Start time: ____________ Joan Heath time: ____________ Date: ____________ Movements:  ____________ Start time: ____________ Joan Heath time: ____________ Date: ____________ Movements: ____________ Start time: ____________ Joan Heath time: ____________ Date: ____________ Movements: ____________ Start time: ____________ Joan Heath time: ____________ Date: ____________ Movements: ____________ Start time: ____________ Joan Heath time: ____________ Date: ____________ Movements: ____________ Start time: ____________ Joan Heath time: ____________ Date: ____________ Movements: ____________ Start time: ____________ Joan Heath time: ____________  Date: ____________ Movements: ____________ Start time: ____________ Joan Heath time: ____________ Date: ____________ Movements: ____________ Start time: ____________ Joan Heath time: ____________ Date: ____________ Movements: ____________ Start time: ____________ Joan Heath time: ____________ Date: ____________ Movements: ____________ Start time: ____________ Joan Heath time: ____________ Date: ____________ Movements: ____________ Start time: ____________ Joan Heath time: ____________ Date: ____________ Movements: ____________ Start time: ____________ Joan Heath time: ____________ Document Released: 10/31/2006 Document Revised: 09/17/2012 Document Reviewed: 07/28/2012 [employer] Patient Information 2014 [place]. Braxton Hicks Contractions Pregnancy is commonly associated with contractions of the uterus throughout the pregnancy. Towards the end of pregnancy (32 to 34 weeks), these contractions [facility]) can develop more often and may become more forceful. This is not true labor because these contractions do not result in opening (dilatation) and thinning of the cervix. They are sometimes difficult to tell apart from true labor because these contractions can be forceful and people have different pain tolerances. You should not feel embarrassed if you go to the hospital with false labor. Sometimes, the only way to tell if you are in true labor is for your caregiver to follow the changes in  the cervix. How to tell the difference between true and false labor:  False labor.  The contractions of false labor are usually shorter, irregular and not as hard as those of true labor.  They are often felt in the front of the lower abdomen and in the groin.  They may leave with walking around or changing positions while lying down.  They get weaker and are shorter lasting as time goes on.  These contractions are usually irregular.  They do not usually become progressively stronger, regular and closer together as with true labor.  True labor.  Contractions in true labor last 30 to 70 seconds, become very regular, usually become more intense, and increase in frequency.  They do not go away with walking.  The discomfort is usually felt in the top of the uterus and spreads to the lower abdomen and low back.  True labor can be determined by your caregiver with an exam. This will show that the cervix is dilating and getting thinner. If there are no prenatal problems or other health problems associated with the pregnancy, it is completely safe to be sent home with false labor and await the onset of true labor.  true labor. °HOME CARE INSTRUCTIONS  °· Keep up with your usual exercises and instructions. °· Take medications as directed. °· Keep your regular prenatal appointment. °· Eat and drink lightly if you think you are going into labor. °· If BH contractions are making you uncomfortable: °· Change your activity position from lying down or resting to walking/walking to resting. °· Sit and rest in a tub of warm water. °· Drink 2 to 3 glasses of water. Dehydration may cause B-H contractions. °· Do slow and deep breathing several times an hour. °SEEK IMMEDIATE MEDICAL CARE IF:  °· Your contractions continue to become stronger, more regular, and closer together. °· You have a gushing, burst or leaking of fluid from the vagina. °· An oral temperature above 102° F (38.9° C) develops. °· You have passage of  blood-tinged mucus. °· You develop vaginal bleeding. °· You develop continuous belly (abdominal) pain. °· You have low back pain that you never had before. °· You feel the baby's head pushing down causing pelvic pressure. °· The baby is not moving as much as it used to. °Document Released: 10/01/2005 Document Revised: 12/24/2011 Document Reviewed: 07/13/2013 °[employer]® Patient Information ©2014 [employer]. ° ° °

## 2014-01-05 ENCOUNTER — Ambulatory Visit (INDEPENDENT_AMBULATORY_CARE_PROVIDER_SITE_OTHER): Payer: Medicaid Other | Admitting: Obstetrics & Gynecology

## 2014-01-05 ENCOUNTER — Encounter: Payer: Self-pay | Admitting: Obstetrics & Gynecology

## 2014-01-05 VITALS — BP 118/74 | Wt 244.0 lb

## 2014-01-05 DIAGNOSIS — Z3480 Encounter for supervision of other normal pregnancy, unspecified trimester: Secondary | ICD-10-CM

## 2014-01-05 DIAGNOSIS — O34219 Maternal care for unspecified type scar from previous cesarean delivery: Secondary | ICD-10-CM

## 2014-01-05 DIAGNOSIS — O3421 Maternal care for scar from previous cesarean delivery: Secondary | ICD-10-CM

## 2014-01-05 NOTE — Progress Notes (Signed)
P-85  Had contractions Friday consistently for two hours.  She ended up going to ER for evaluation and they fizzled out.

## 2014-01-05 NOTE — Progress Notes (Signed)
Routine visit. Good FM. No problems except mild contractions. She denies VB or ROM. Labor precautions reviewed.

## 2014-01-11 ENCOUNTER — Encounter: Payer: Self-pay | Admitting: Family Medicine

## 2014-01-11 ENCOUNTER — Ambulatory Visit (INDEPENDENT_AMBULATORY_CARE_PROVIDER_SITE_OTHER): Payer: Medicaid Other | Admitting: Family Medicine

## 2014-01-11 VITALS — BP 131/72 | Wt 246.0 lb

## 2014-01-11 DIAGNOSIS — F319 Bipolar disorder, unspecified: Secondary | ICD-10-CM

## 2014-01-11 DIAGNOSIS — O99340 Other mental disorders complicating pregnancy, unspecified trimester: Secondary | ICD-10-CM

## 2014-01-11 DIAGNOSIS — IMO0002 Reserved for concepts with insufficient information to code with codable children: Secondary | ICD-10-CM

## 2014-01-11 DIAGNOSIS — Z3480 Encounter for supervision of other normal pregnancy, unspecified trimester: Secondary | ICD-10-CM

## 2014-01-11 DIAGNOSIS — O3421 Maternal care for scar from previous cesarean delivery: Secondary | ICD-10-CM

## 2014-01-11 DIAGNOSIS — O34219 Maternal care for unspecified type scar from previous cesarean delivery: Secondary | ICD-10-CM

## 2014-01-11 NOTE — Progress Notes (Signed)
Doing well. Labor precautions  

## 2014-01-11 NOTE — Patient Instructions (Signed)
Breastfeeding Deciding to breastfeed is one of the best choices you can make for you and your baby. A change in hormones during pregnancy causes your breast tissue to grow and increases the number and size of your milk ducts. These hormones also allow proteins, sugars, and fats from your blood supply to make breast milk in your milk-producing glands. Hormones prevent breast milk from being released before your baby is born as well as prompt milk flow after birth. Once breastfeeding has begun, thoughts of your baby, as well as his or her sucking or crying, can stimulate the release of milk from your milk-producing glands.  BENEFITS OF BREASTFEEDING For Your Baby  Your first milk (colostrum) helps your baby's digestive system function better.   There are antibodies in your milk that help your baby fight off infections.   Your baby has a lower incidence of asthma, allergies, and sudden infant death syndrome.   The nutrients in breast milk are better for your baby than infant formulas and are designed uniquely for your baby's needs.   Breast milk improves your baby's brain development.   Your baby is less likely to develop other conditions, such as childhood obesity, asthma, or type 2 diabetes mellitus.  For You   Breastfeeding helps to create a very special bond between you and your baby.   Breastfeeding is convenient. Breast milk is always available at the correct temperature and costs nothing.   Breastfeeding helps to burn calories and helps you lose the weight gained during pregnancy.   Breastfeeding makes your uterus contract to its prepregnancy size faster and slows bleeding (lochia) after you give birth.   Breastfeeding helps to lower your risk of developing type 2 diabetes mellitus, osteoporosis, and breast or ovarian cancer later in life. SIGNS THAT YOUR BABY IS HUNGRY Early Signs of Hunger  Increased alertness or activity.  Stretching.  Movement of the head from  side to side.  Movement of the head and opening of the mouth when the corner of the mouth or cheek is stroked (rooting).  Increased sucking sounds, smacking lips, cooing, sighing, or squeaking.  Hand-to-mouth movements.  Increased sucking of fingers or hands. Late Signs of Hunger  Fussing.  Intermittent crying. Extreme Signs of Hunger Signs of extreme hunger will require calming and consoling before your baby will be able to breastfeed successfully. Do not wait for the following signs of extreme hunger to occur before you initiate breastfeeding:   Restlessness.  A loud, strong cry.   Screaming. BREASTFEEDING BASICS Breastfeeding Initiation  Find a comfortable place to sit or lie down, with your neck and back well supported.  Place a pillow or rolled up blanket under your baby to bring him or her to the level of your breast (if you are seated). Nursing pillows are specially designed to help support your arms and your baby while you breastfeed.  Make sure that your baby's abdomen is facing your abdomen.   Gently massage your breast. With your fingertips, massage from your chest wall toward your nipple in a circular motion. This encourages milk flow. You may need to continue this action during the feeding if your milk flows slowly.  Support your breast with 4 fingers underneath and your thumb above your nipple. Make sure your fingers are well away from your nipple and your baby's mouth.   Stroke your baby's lips gently with your finger or nipple.   When your baby's mouth is open wide enough, quickly bring your baby to your   breast, placing your entire nipple and as much of the colored area around your nipple (areola) as possible into your baby's mouth.   More areola should be visible above your baby's upper lip than below the lower lip.   Your baby's tongue should be between his or her lower gum and your breast.   Ensure that your baby's mouth is correctly positioned  around your nipple (latched). Your baby's lips should create a seal on your breast and be turned out (everted).  It is common for your baby to suck about 2 3 minutes in order to start the flow of breast milk. Latching Teaching your baby how to latch on to your breast properly is very important. An improper latch can cause nipple pain and decreased milk supply for you and poor weight gain in your baby. Also, if your baby is not latched onto your nipple properly, he or she may swallow some air during feeding. This can make your baby fussy. Burping your baby when you switch breasts during the feeding can help to get rid of the air. However, teaching your baby to latch on properly is still the best way to prevent fussiness from swallowing air while breastfeeding. Signs that your baby has successfully latched on to your nipple:    Silent tugging or silent sucking, without causing you pain.   Swallowing heard between every 3 4 sucks.    Muscle movement above and in front of his or her ears while sucking.  Signs that your baby has not successfully latched on to nipple:   Sucking sounds or smacking sounds from your baby while breastfeeding.  Nipple pain. If you think your baby has not latched on correctly, slip your finger into the corner of your baby's mouth to break the suction and place it between your baby's gums. Attempt breastfeeding initiation again. Signs of Successful Breastfeeding Signs from your baby:   A gradual decrease in the number of sucks or complete cessation of sucking.   Falling asleep.   Relaxation of his or her body.   Retention of a small amount of milk in his or her mouth.   Letting go of your breast by himself or herself. Signs from you:  Breasts that have increased in firmness, weight, and size 1 3 hours after feeding.   Breasts that are softer immediately after breastfeeding.  Increased milk volume, as well as a change in milk consistency and color by  the 5th day of breastfeeding.   Nipples that are not sore, cracked, or bleeding. Signs That Your Randel Books is Getting Enough Milk  Wetting at least 3 diapers in a 24-hour period. The urine should be clear and pale yellow by age 64411 days.  At least 3 stools in a 24-hour period by age 64411 days. The stool should be soft and yellow.  At least 3 stools in a 24-hour period by age 644 days. The stool should be seedy and yellow.  No loss of weight greater than 10% of birth weight during the first 22 days of age.  Average weight gain of 4 7 ounces (120 210 mL) per week after age 64 days.  Consistent daily weight gain by age 60 days, without weight loss after the age of 2 weeks. After a feeding, your baby may spit up a small amount. This is common. BREASTFEEDING FREQUENCY AND DURATION Frequent feeding will help you make more milk and can prevent sore nipples and breast engorgement. Breastfeed when you feel the need to reduce  the fullness of your breasts or when your baby shows signs of hunger. This is called "breastfeeding on demand." Avoid introducing a pacifier to your baby while you are working to establish breastfeeding (the first 4 6 weeks after your baby is born). After this time you may choose to use a pacifier. Research has shown that pacifier use during the first year of a baby's life decreases the risk of sudden infant death syndrome (SIDS). Allow your baby to feed on each breast as long as he or she wants. Breastfeed until your baby is finished feeding. When your baby unlatches or falls asleep while feeding from the first breast, offer the second breast. Because newborns are often sleepy in the first few weeks of life, you may need to awaken your baby to get him or her to feed. Breastfeeding times will vary from baby to baby. However, the following rules can serve as a guide to help you ensure that your baby is properly fed:  Newborns (babies 58 weeks of age or younger) may breastfeed every 1 3  hours.  Newborns should not go longer than 3 hours during the day or 5 hours during the night without breastfeeding.  You should breastfeed your baby a minimum of 8 times in a 24-hour period until you begin to introduce solid foods to your baby at around 49 months of age. BREAST MILK PUMPING Pumping and storing breast milk allows you to ensure that your baby is exclusively fed your breast milk, even at times when you are unable to breastfeed. This is especially important if you are going back to work while you are still breastfeeding or when you are not able to be present during feedings. Your lactation consultant can give you guidelines on how long it is safe to store breast milk.  A breast pump is a machine that allows you to pump milk from your breast into a sterile bottle. The pumped breast milk can then be stored in a refrigerator or freezer. Some breast pumps are operated by hand, while others use electricity. Ask your lactation consultant which type will work best for you. Breast pumps can be purchased, but some hospitals and breastfeeding support groups lease breast pumps on a monthly basis. A lactation consultant can teach you how to hand express breast milk, if you prefer not to use a pump.  CARING FOR YOUR BREASTS WHILE YOU BREASTFEED Nipples can become dry, cracked, and sore while breastfeeding. The following recommendations can help keep your breasts moisturized and healthy:  Avoid using soap on your nipples.   Wear a supportive bra. Although not required, special nursing bras and tank tops are designed to allow access to your breasts for breastfeeding without taking off your entire bra or top. Avoid wearing underwire style bras or extremely tight bras.  Air dry your nipples for 3 32minutes after each feeding.   Use only cotton bra pads to absorb leaked breast milk. Leaking of breast milk between feedings is normal.   Use lanolin on your nipples after breastfeeding. Lanolin helps to  maintain your skin's normal moisture barrier. If you use pure lanolin you do not need to wash it off before feeding your baby again. Pure lanolin is not toxic to your baby. You may also hand express a few drops of breast milk and gently massage that milk into your nipples and allow the milk to air dry. In the first few weeks after giving birth, some women experience extremely full breasts (engorgement). Engorgement can make  your breasts feel heavy, warm, and tender to the touch. Engorgement peaks within 3 5 days after you give birth. The following recommendations can help ease engorgement:  Completely empty your breasts while breastfeeding or pumping. You may want to start by applying warm, moist heat (in the shower or with warm water-soaked hand towels) just before feeding or pumping. This increases circulation and helps the milk flow. If your baby does not completely empty your breasts while breastfeeding, pump any extra milk after he or she is finished.  Wear a snug bra (nursing or regular) or tank top for 1 2 days to signal your body to slightly decrease milk production.  Apply ice packs to your breasts, unless this is too uncomfortable for you.  Make sure that your baby is latched on and positioned properly while breastfeeding. If engorgement persists after 48 hours of following these recommendations, contact your health care provider or a Science writer. OVERALL HEALTH CARE RECOMMENDATIONS WHILE BREASTFEEDING  Eat healthy foods. Alternate between meals and snacks, eating 3 of each per day. Because what you eat affects your breast milk, some of the foods may make your baby more irritable than usual. Avoid eating these foods if you are sure that they are negatively affecting your baby.  Drink milk, fruit juice, and water to satisfy your thirst (about 10 glasses a day).   Rest often, relax, and continue to take your prenatal vitamins to prevent fatigue, stress, and anemia.  Continue  breast self-awareness checks.  Avoid chewing and smoking tobacco.  Avoid alcohol and drug use. Some medicines that may be harmful to your baby can pass through breast milk. It is important to ask your health care provider before taking any medicine, including all over-the-counter and prescription medicine as well as vitamin and herbal supplements. It is possible to become pregnant while breastfeeding. If birth control is desired, ask your health care provider about options that will be safe for your baby. SEEK MEDICAL CARE IF:   You feel like you want to stop breastfeeding or have become frustrated with breastfeeding.  You have painful breasts or nipples.  Your nipples are cracked or bleeding.  Your breasts are red, tender, or warm.  You have a swollen area on either breast.  You have a fever or chills.  You have nausea or vomiting.  You have drainage other than breast milk from your nipples.  Your breasts do not become full before feedings by the 5th day after you give birth.  You feel sad and depressed.  Your baby is too sleepy to eat well.  Your baby is having trouble sleeping.   Your baby is wetting less than 3 diapers in a 24-hour period.  Your baby has less than 3 stools in a 24-hour period.  Your baby's skin or the white part of his or her eyes becomes yellow.   Your baby is not gaining weight by 74 days of age. SEEK IMMEDIATE MEDICAL CARE IF:   Your baby is overly tired (lethargic) and does not want to wake up and feed.  Your baby develops an unexplained fever. Document Released: 10/01/2005 Document Revised: 06/03/2013 Document Reviewed: 03/25/2013 [facility] Patient Information 2014 [place].

## 2014-01-11 NOTE — Progress Notes (Signed)
P-89 

## 2014-01-18 ENCOUNTER — Encounter: Payer: Self-pay | Admitting: Family Medicine

## 2014-01-18 ENCOUNTER — Ambulatory Visit (INDEPENDENT_AMBULATORY_CARE_PROVIDER_SITE_OTHER): Payer: Medicaid Other | Admitting: Family Medicine

## 2014-01-18 VITALS — BP 123/70 | Wt 246.0 lb

## 2014-01-18 DIAGNOSIS — Z3480 Encounter for supervision of other normal pregnancy, unspecified trimester: Secondary | ICD-10-CM

## 2014-01-18 DIAGNOSIS — O480 Post-term pregnancy: Secondary | ICD-10-CM

## 2014-01-18 NOTE — Patient Instructions (Signed)
Breastfeeding Deciding to breastfeed is one of the best choices you can make for you and your baby. A change in hormones during pregnancy causes your breast tissue to grow and increases the number and size of your milk ducts. These hormones also allow proteins, sugars, and fats from your blood supply to make breast milk in your milk-producing glands. Hormones prevent breast milk from being released before your baby is born as well as prompt milk flow after birth. Once breastfeeding has begun, thoughts of your baby, as well as his or her sucking or crying, can stimulate the release of milk from your milk-producing glands.  BENEFITS OF BREASTFEEDING For Your Baby  Your first milk (colostrum) helps your baby's digestive system function better.   There are antibodies in your milk that help your baby fight off infections.   Your baby has a lower incidence of asthma, allergies, and sudden infant death syndrome.   The nutrients in breast milk are better for your baby than infant formulas and are designed uniquely for your baby's needs.   Breast milk improves your baby's brain development.   Your baby is less likely to develop other conditions, such as childhood obesity, asthma, or type 2 diabetes mellitus.  For You   Breastfeeding helps to create a very special bond between you and your baby.   Breastfeeding is convenient. Breast milk is always available at the correct temperature and costs nothing.   Breastfeeding helps to burn calories and helps you lose the weight gained during pregnancy.   Breastfeeding makes your uterus contract to its prepregnancy size faster and slows bleeding (lochia) after you give birth.   Breastfeeding helps to lower your risk of developing type 2 diabetes mellitus, osteoporosis, and breast or ovarian cancer later in life. SIGNS THAT YOUR BABY IS HUNGRY Early Signs of Hunger  Increased alertness or activity.  Stretching.  Movement of the head from  side to side.  Movement of the head and opening of the mouth when the corner of the mouth or cheek is stroked (rooting).  Increased sucking sounds, smacking lips, cooing, sighing, or squeaking.  Hand-to-mouth movements.  Increased sucking of fingers or hands. Late Signs of Hunger  Fussing.  Intermittent crying. Extreme Signs of Hunger Signs of extreme hunger will require calming and consoling before your baby will be able to breastfeed successfully. Do not wait for the following signs of extreme hunger to occur before you initiate breastfeeding:   Restlessness.  A loud, strong cry.   Screaming. BREASTFEEDING BASICS Breastfeeding Initiation  Find a comfortable place to sit or lie down, with your neck and back well supported.  Place a pillow or rolled up blanket under your baby to bring him or her to the level of your breast (if you are seated). Nursing pillows are specially designed to help support your arms and your baby while you breastfeed.  Make sure that your baby's abdomen is facing your abdomen.   Gently massage your breast. With your fingertips, massage from your chest wall toward your nipple in a circular motion. This encourages milk flow. You may need to continue this action during the feeding if your milk flows slowly.  Support your breast with 4 fingers underneath and your thumb above your nipple. Make sure your fingers are well away from your nipple and your baby's mouth.   Stroke your baby's lips gently with your finger or nipple.   When your baby's mouth is open wide enough, quickly bring your baby to your   breast, placing your entire nipple and as much of the colored area around your nipple (areola) as possible into your baby's mouth.   More areola should be visible above your baby's upper lip than below the lower lip.   Your baby's tongue should be between his or her lower gum and your breast.   Ensure that your baby's mouth is correctly positioned  around your nipple (latched). Your baby's lips should create a seal on your breast and be turned out (everted).  It is common for your baby to suck about 2 3 minutes in order to start the flow of breast milk. Latching Teaching your baby how to latch on to your breast properly is very important. An improper latch can cause nipple pain and decreased milk supply for you and poor weight gain in your baby. Also, if your baby is not latched onto your nipple properly, he or she may swallow some air during feeding. This can make your baby fussy. Burping your baby when you switch breasts during the feeding can help to get rid of the air. However, teaching your baby to latch on properly is still the best way to prevent fussiness from swallowing air while breastfeeding. Signs that your baby has successfully latched on to your nipple:    Silent tugging or silent sucking, without causing you pain.   Swallowing heard between every 3 4 sucks.    Muscle movement above and in front of his or her ears while sucking.  Signs that your baby has not successfully latched on to nipple:   Sucking sounds or smacking sounds from your baby while breastfeeding.  Nipple pain. If you think your baby has not latched on correctly, slip your finger into the corner of your baby's mouth to break the suction and place it between your baby's gums. Attempt breastfeeding initiation again. Signs of Successful Breastfeeding Signs from your baby:   A gradual decrease in the number of sucks or complete cessation of sucking.   Falling asleep.   Relaxation of his or her body.   Retention of a small amount of milk in his or her mouth.   Letting go of your breast by himself or herself. Signs from you:  Breasts that have increased in firmness, weight, and size 1 3 hours after feeding.   Breasts that are softer immediately after breastfeeding.  Increased milk volume, as well as a change in milk consistency and color by  the 5th day of breastfeeding.   Nipples that are not sore, cracked, or bleeding. Signs That Your [employer]el Books is Getting Enough Milk  Wetting at least 3 diapers in a 24-hour period. The urine should be clear and pale yellow by age 64411 days.  At least 3 stools in a 24-hour period by age 64411 days. The stool should be soft and yellow.  At least 3 stools in a 24-hour period by age 644 days. The stool should be seedy and yellow.  No loss of weight greater than 10% of birth weight during the first 22 days of age.  Average weight gain of 4 7 ounces (120 210 mL) per week after age 64 days.  Consistent daily weight gain by age 60 days, without weight loss after the age of 2 weeks. After a feeding, your baby may spit up a small amount. This is common. BREASTFEEDING FREQUENCY AND DURATION Frequent feeding will help you make more milk and can prevent sore nipples and breast engorgement. Breastfeed when you feel the need to reduce  the fullness of your breasts or when your baby shows signs of hunger. This is called "breastfeeding on demand." Avoid introducing a pacifier to your baby while you are working to establish breastfeeding (the first 4 6 weeks after your baby is born). After this time you may choose to use a pacifier. Research has shown that pacifier use during the first year of a baby's life decreases the risk of sudden infant death syndrome (SIDS). Allow your baby to feed on each breast as long as he or she wants. Breastfeed until your baby is finished feeding. When your baby unlatches or falls asleep while feeding from the first breast, offer the second breast. Because newborns are often sleepy in the first few weeks of life, you may need to awaken your baby to get him or her to feed. Breastfeeding times will vary from baby to baby. However, the following rules can serve as a guide to help you ensure that your baby is properly fed:  Newborns (babies 4 weeks of age or younger) may breastfeed every 1 3  hours.  Newborns should not go longer than 3 hours during the day or 5 hours during the night without breastfeeding.  You should breastfeed your baby a minimum of 8 times in a 24-hour period until you begin to introduce solid foods to your baby at around 6 months of age. BREAST MILK PUMPING Pumping and storing breast milk allows you to ensure that your baby is exclusively fed your breast milk, even at times when you are unable to breastfeed. This is especially important if you are going back to work while you are still breastfeeding or when you are not able to be present during feedings. Your lactation consultant can give you guidelines on how long it is safe to store breast milk.  A breast pump is a machine that allows you to pump milk from your breast into a sterile bottle. The pumped breast milk can then be stored in a refrigerator or freezer. Some breast pumps are operated by hand, while others use electricity. Ask your lactation consultant which type will work best for you. Breast pumps can be purchased, but some hospitals and breastfeeding support groups lease breast pumps on a monthly basis. A lactation consultant can teach you how to hand express breast milk, if you prefer not to use a pump.  CARING FOR YOUR BREASTS WHILE YOU BREASTFEED Nipples can become dry, cracked, and sore while breastfeeding. The following recommendations can help keep your breasts moisturized and healthy:  Avoid using soap on your nipples.   Wear a supportive bra. Although not required, special nursing bras and tank tops are designed to allow access to your breasts for breastfeeding without taking off your entire bra or top. Avoid wearing underwire style bras or extremely tight bras.  Air dry your nipples for 3 4minutes after each feeding.   Use only cotton bra pads to absorb leaked breast milk. Leaking of breast milk between feedings is normal.   Use lanolin on your nipples after breastfeeding. Lanolin helps to  maintain your skin's normal moisture barrier. If you use pure lanolin you do not need to wash it off before feeding your baby again. Pure lanolin is not toxic to your baby. You may also hand express a few drops of breast milk and gently massage that milk into your nipples and allow the milk to air dry. In the first few weeks after giving birth, some women experience extremely full breasts (engorgement). Engorgement can make   your breasts feel heavy, warm, and tender to the touch. Engorgement peaks within 3 5 days after you give birth. The following recommendations can help ease engorgement:  Completely empty your breasts while breastfeeding or pumping. You may want to start by applying warm, moist heat (in the shower or with warm water-soaked hand towels) just before feeding or pumping. This increases circulation and helps the milk flow. If your baby does not completely empty your breasts while breastfeeding, pump any extra milk after he or she is finished.  Wear a snug bra (nursing or regular) or tank top for 1 2 days to signal your body to slightly decrease milk production.  Apply ice packs to your breasts, unless this is too uncomfortable for you.  Make sure that your baby is latched on and positioned properly while breastfeeding. If engorgement persists after 48 hours of following these recommendations, contact your health care provider or a Science writer. OVERALL HEALTH CARE RECOMMENDATIONS WHILE BREASTFEEDING  Eat healthy foods. Alternate between meals and snacks, eating 3 of each per day. Because what you eat affects your breast milk, some of the foods may make your baby more irritable than usual. Avoid eating these foods if you are sure that they are negatively affecting your baby.  Drink milk, fruit juice, and water to satisfy your thirst (about 10 glasses a day).   Rest often, relax, and continue to take your prenatal vitamins to prevent fatigue, stress, and anemia.  Continue  breast self-awareness checks.  Avoid chewing and smoking tobacco.  Avoid alcohol and drug use. Some medicines that may be harmful to your baby can pass through breast milk. It is important to ask your health care provider before taking any medicine, including all over-the-counter and prescription medicine as well as vitamin and herbal supplements. It is possible to become pregnant while breastfeeding. If birth control is desired, ask your health care provider about options that will be safe for your baby. SEEK MEDICAL CARE IF:   You feel like you want to stop breastfeeding or have become frustrated with breastfeeding.  You have painful breasts or nipples.  Your nipples are cracked or bleeding.  Your breasts are red, tender, or warm.  You have a swollen area on either breast.  You have a fever or chills.  You have nausea or vomiting.  You have drainage other than breast milk from your nipples.  Your breasts do not become full before feedings by the 5th day after you give birth.  You feel sad and depressed.  Your baby is too sleepy to eat well.  Your baby is having trouble sleeping.   Your baby is wetting less than 3 diapers in a 24-hour period.  Your baby has less than 3 stools in a 24-hour period.  Your baby's skin or the white part of his or her eyes becomes yellow.   Your baby is not gaining weight by 74 days of age. SEEK IMMEDIATE MEDICAL CARE IF:   Your baby is overly tired (lethargic) and does not want to wake up and feed.  Your baby develops an unexplained fever. Document Released: 10/01/2005 Document Revised: 06/03/2013 Document Reviewed: 03/25/2013 [facility] Patient Information 2014 [place].

## 2014-01-18 NOTE — Progress Notes (Signed)
NST reviewed and reactive. IOL scheduled for 41 wwks

## 2014-01-18 NOTE — Progress Notes (Signed)
P-110

## 2014-01-19 ENCOUNTER — Encounter (HOSPITAL_COMMUNITY): Payer: Self-pay | Admitting: *Deleted

## 2014-01-19 ENCOUNTER — Telehealth (HOSPITAL_COMMUNITY): Payer: Self-pay | Admitting: *Deleted

## 2014-01-19 NOTE — Telephone Encounter (Signed)
Preadmission screen  

## 2014-01-20 IMAGING — US US OB FOLLOW-UP
1 series · 12 of 28 positions shown · non-contrast
Comparison: none

[Series 1: us ob follow up · 12 of 92 slices shown]
[im 4/92]
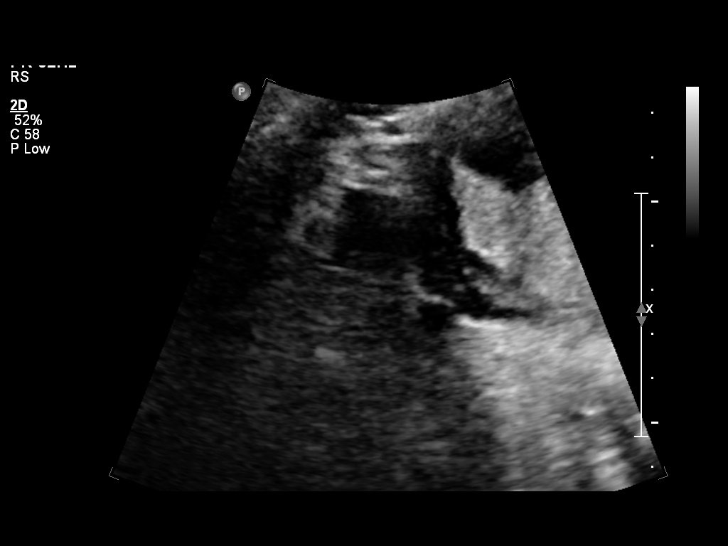
[im 11/92]
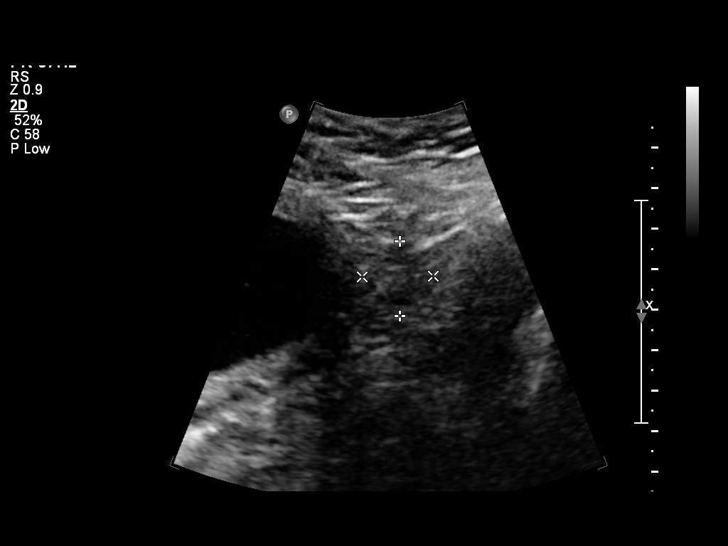
[im 17/92]
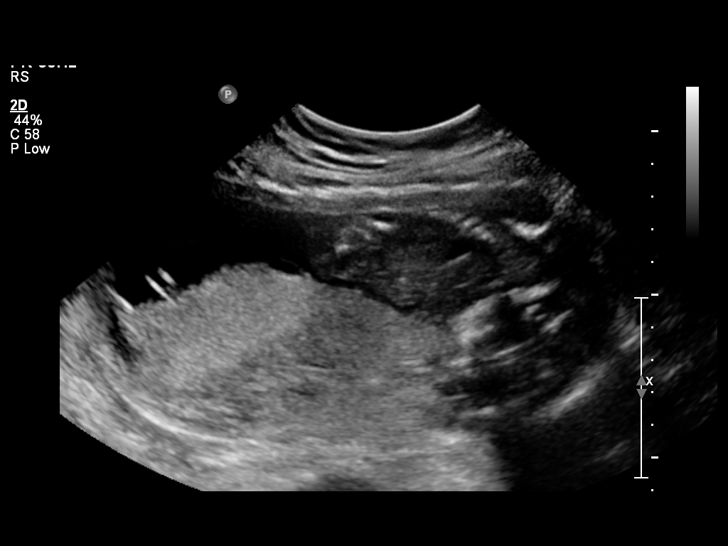
[im 27/92]
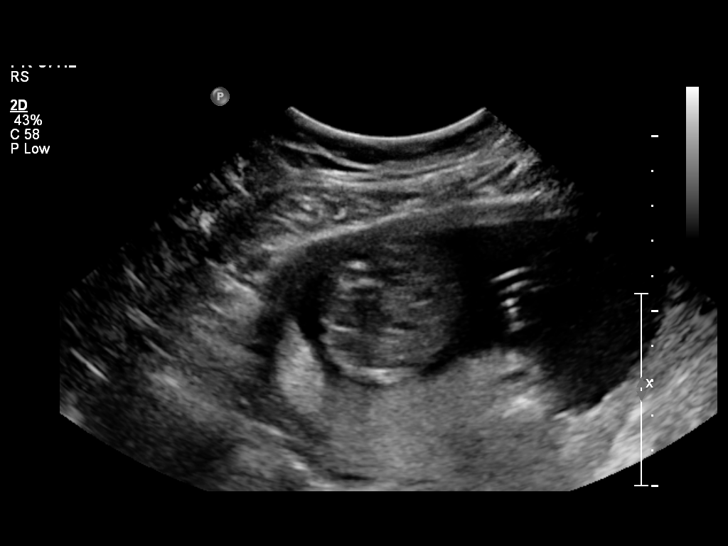
[im 34/92]
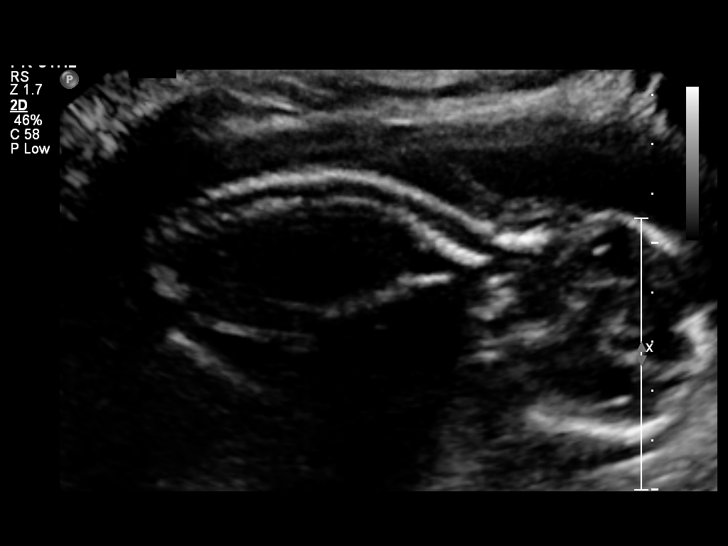
[im 41/92]
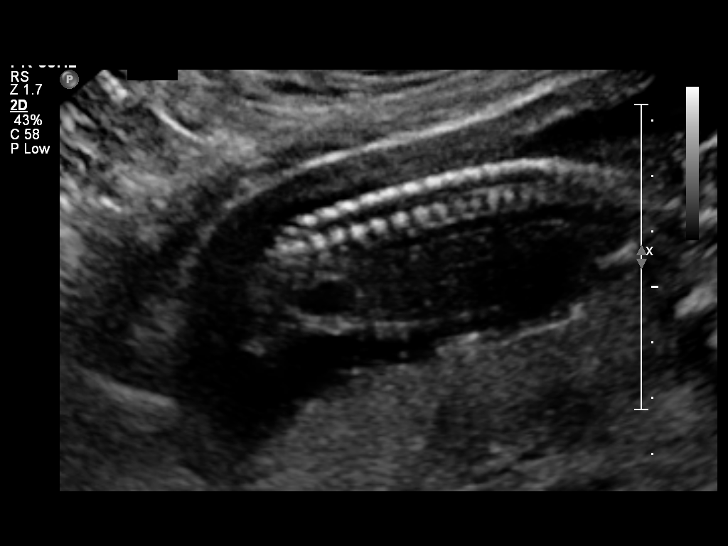
[im 51/92]
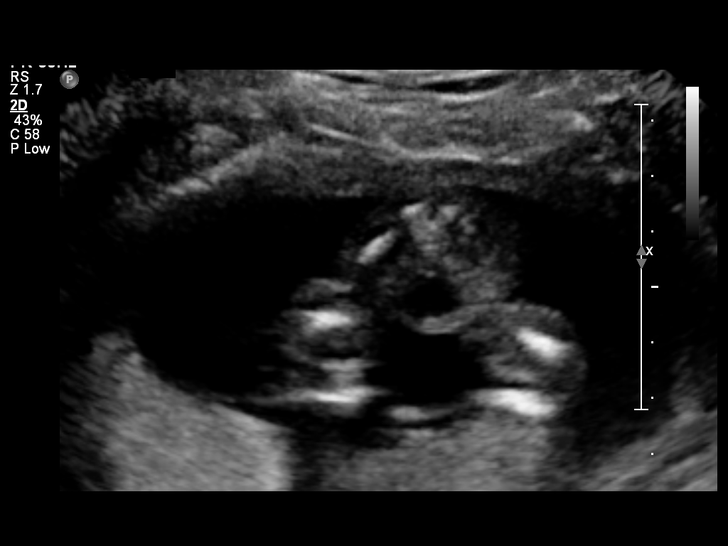
[im 58/92]
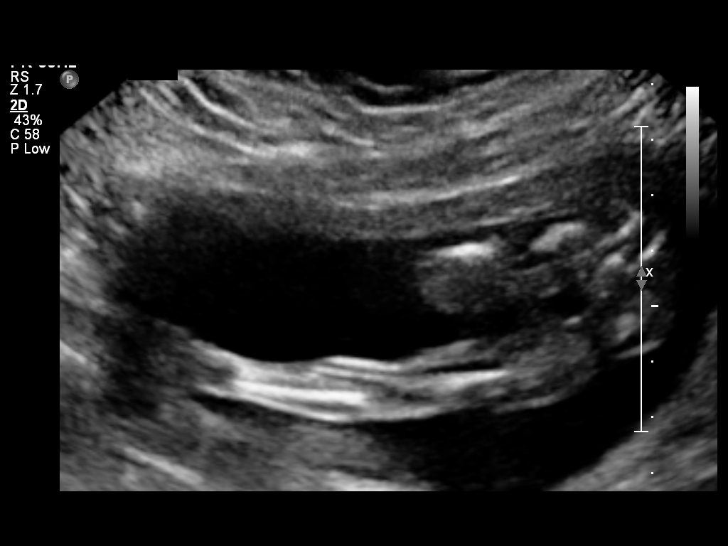
[im 65/92]
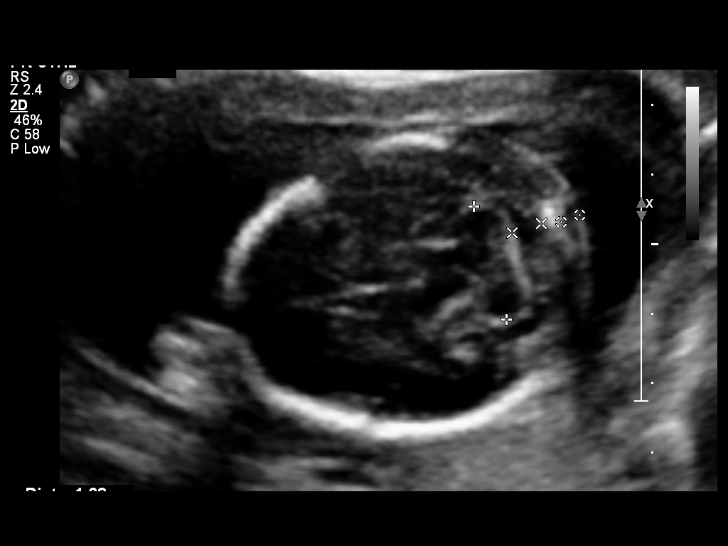
[im 75/92]
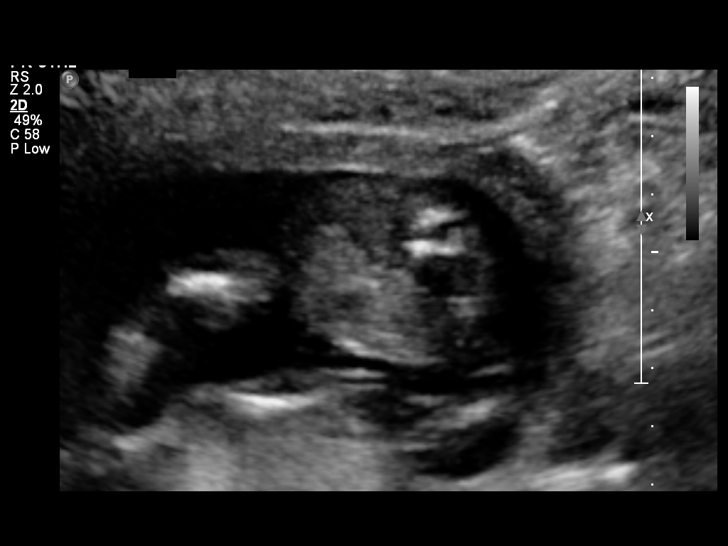
[im 81/92]
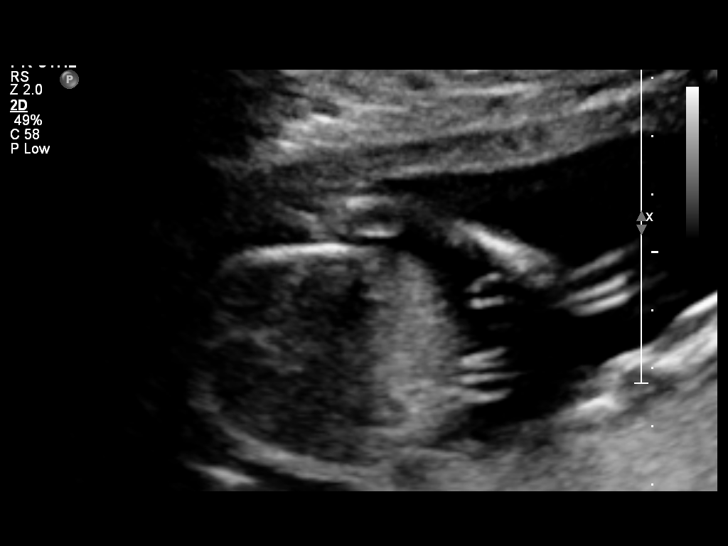
[im 88/92]
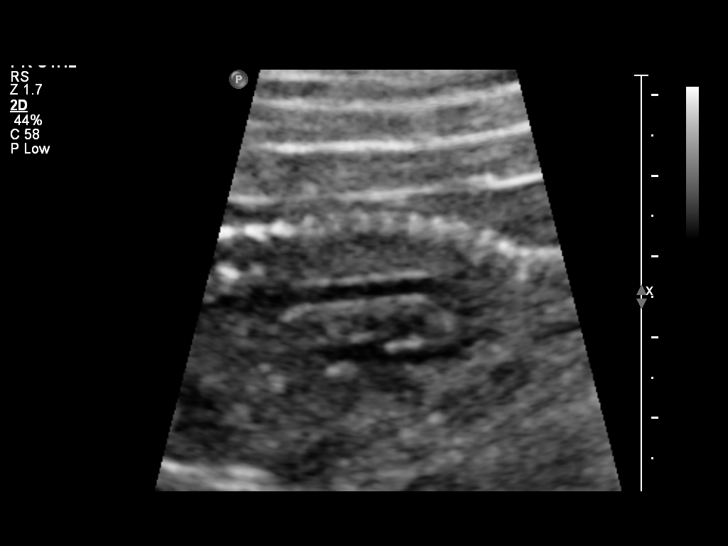

[12 of 28 positions shown; findings below may reference images not displayed]

OBSTETRICS REPORT
                      (Signed Final 08/17/2013 [DATE])

Service(s) Provided

 US OB FOLLOW UP                                       76816.1
Indications

 Basic anatomic survey

Fetal Evaluation

 Num Of Fetuses:    1
 Fetal Heart Rate:  156                          bpm
 Cardiac Activity:  Observed
 Presentation:      Cephalic
 Placenta:          Posterior, above cervical
                    os
 P. Cord            Visualized
 Insertion:

 Amniotic Fluid
 AFI FV:      Subjectively within normal limits
                                             Larg Pckt:    3.28  cm
Biometry

 BPD:     40.6  mm     G. Age:  18w 2d                CI:        75.92   70 - 86
                                                      FL/HC:      18.1   16.1 -

 HC:     147.7  mm     G. Age:  17w 6d       15  %    HC/AC:      1.17   1.09 -

 AC:     126.1  mm     G. Age:  18w 1d       35  %    FL/BPD:
 FL:      26.7  mm     G. Age:  18w 1d       29  %    FL/AC:      21.2   20 - 24

 Est. FW:     226  gm      0 lb 8 oz     37  %
Gestational Age

 U/S Today:     18w 1d                                        EDD:   01/17/14
 Best:          18w 4d     Det. By:  U/S (07/20/13)           EDD:   01/14/14
Anatomy

 Cranium:          Appears normal         Aortic Arch:      Basic anatomy
                                                            exam per order
 Fetal Cavum:      Appears normal         Ductal Arch:      Basic anatomy
                                                            exam per order
 Ventricles:       Appears normal         Diaphragm:        Not well visualized
 Choroid Plexus:   Appears normal         Stomach:          Appears normal, left
                                                            sided
 Cerebellum:       Appears normal         Abdomen:          Not well visualized
 Posterior Fossa:  Appears normal         Abdominal Wall:   Not well visualized
 Nuchal Fold:      Appears normal         Cord Vessels:     Appears normal (3
                                                            vessel cord)
 Face:             Orbits appear          Kidneys:          Appear normal
                   normal
 Lips:             Appears normal         Bladder:          Appears normal
 Heart:            Not well visualized    Spine:            Appears normal
 RVOT:             Not well visualized    Lower             Appears normal
                                          Extremities:
 LVOT:             Not well visualized    Upper             Appears normal
                                          Extremities:

 Other:  Technically difficult due to maternal habitus and fetal position.
Cervix Uterus Adnexa

 Cervical Length:    3.45     cm

 Cervix:       Normal appearance by transabdominal scan.
 Uterus:       No abnormality visualized.
 Cul De Sac:   No free fluid seen.
 Left Ovary:    Within normal limits.
 Right Ovary:   Within normal limits.

 Adnexa:     No abnormality visualized.
Impression

 Active SIUP at 53w9d on comprehensive fetal survey
 EFW is at the 37th percentile
 AFI is gestational age appropriate
 No structural defects seen noting limitations as documente
 above warranting follow up attempt to complete anatomy
Recommendations

 Recommend follow up ultrasound in 4-6 weeks replete with
 interval growth.

 questions or concerns.

## 2014-01-21 ENCOUNTER — Inpatient Hospital Stay (HOSPITAL_COMMUNITY)
Admission: RE | Admit: 2014-01-21 | Discharge: 2014-01-24 | DRG: 766 | Disposition: A | Discharge status: Home / Self Care | Payer: Medicaid Other | Source: Ambulatory Visit | Attending: Family Medicine | Admitting: Family Medicine

## 2014-01-21 ENCOUNTER — Encounter (HOSPITAL_COMMUNITY): Payer: Self-pay | Admitting: *Deleted

## 2014-01-21 ENCOUNTER — Telehealth (HOSPITAL_COMMUNITY): Payer: Self-pay | Admitting: *Deleted

## 2014-01-21 ENCOUNTER — Inpatient Hospital Stay (HOSPITAL_COMMUNITY)
Admission: RE | Admit: 2014-01-21 | Discharge: 2014-01-21 | Disposition: A | Discharge status: Home / Self Care | Payer: Medicaid Other | Source: Ambulatory Visit | Attending: Family Medicine | Admitting: Family Medicine

## 2014-01-21 DIAGNOSIS — O480 Post-term pregnancy: Principal | ICD-10-CM | POA: Diagnosis present

## 2014-01-21 DIAGNOSIS — O99330 Smoking (tobacco) complicating pregnancy, unspecified trimester: Secondary | ICD-10-CM

## 2014-01-21 DIAGNOSIS — Z6839 Body mass index (BMI) 39.0-39.9, adult: Secondary | ICD-10-CM

## 2014-01-21 DIAGNOSIS — Z3480 Encounter for supervision of other normal pregnancy, unspecified trimester: Secondary | ICD-10-CM

## 2014-01-21 DIAGNOSIS — F1210 Cannabis abuse, uncomplicated: Secondary | ICD-10-CM | POA: Diagnosis present

## 2014-01-21 DIAGNOSIS — O4290 Premature rupture of membranes, unspecified as to length of time between rupture and onset of labor, unspecified weeks of gestation: Secondary | ICD-10-CM | POA: Diagnosis present

## 2014-01-21 DIAGNOSIS — E669 Obesity, unspecified: Secondary | ICD-10-CM | POA: Diagnosis present

## 2014-01-21 DIAGNOSIS — O99344 Other mental disorders complicating childbirth: Secondary | ICD-10-CM | POA: Diagnosis present

## 2014-01-21 DIAGNOSIS — F319 Bipolar disorder, unspecified: Secondary | ICD-10-CM | POA: Diagnosis present

## 2014-01-21 DIAGNOSIS — Z87891 Personal history of nicotine dependence: Secondary | ICD-10-CM

## 2014-01-21 DIAGNOSIS — Z302 Encounter for sterilization: Secondary | ICD-10-CM | POA: Diagnosis not present

## 2014-01-21 DIAGNOSIS — Z98891 History of uterine scar from previous surgery: Secondary | ICD-10-CM

## 2014-01-21 DIAGNOSIS — O34219 Maternal care for unspecified type scar from previous cesarean delivery: Secondary | ICD-10-CM | POA: Diagnosis present

## 2014-01-21 DIAGNOSIS — O99340 Other mental disorders complicating pregnancy, unspecified trimester: Secondary | ICD-10-CM

## 2014-01-21 DIAGNOSIS — Z3490 Encounter for supervision of normal pregnancy, unspecified, unspecified trimester: Secondary | ICD-10-CM

## 2014-01-21 DIAGNOSIS — O99210 Obesity complicating pregnancy, unspecified trimester: Secondary | ICD-10-CM

## 2014-01-21 DIAGNOSIS — O99214 Obesity complicating childbirth: Secondary | ICD-10-CM

## 2014-01-21 LAB — CBC
HCT: 36.2 % (ref 36.0–46.0)
Hemoglobin: 12.3 g/dL (ref 12.0–15.0)
MCH: 29.6 pg (ref 26.0–34.0)
MCHC: 34 g/dL (ref 30.0–36.0)
MCV: 87 fL (ref 78.0–100.0)
Platelets: 218 10*3/uL (ref 150–400)
RBC: 4.16 MIL/uL (ref 3.87–5.11)
RDW: 13.7 % (ref 11.5–15.5)
WBC: 14.7 10*3/uL — AB (ref 4.0–10.5)

## 2014-01-21 LAB — ABO/RH: ABO/RH(D): B POS

## 2014-01-21 LAB — TYPE AND SCREEN
ABO/RH(D): B POS
ANTIBODY SCREEN: NEGATIVE

## 2014-01-21 LAB — RPR

## 2014-01-21 MED ORDER — ZOLPIDEM TARTRATE 5 MG PO TABS
5.0000 mg | ORAL_TABLET | Freq: Every evening | ORAL | Status: DC | PRN
  Administered 2014-01-21: 5 mg via ORAL
  Filled 2014-01-21: qty 1

## 2014-01-21 MED ORDER — ACETAMINOPHEN 325 MG PO TABS: 650.0000 mg | ORAL_TABLET | ORAL | Status: DC | PRN

## 2014-01-21 MED ORDER — LACTATED RINGERS IV SOLN
INTRAVENOUS | Status: DC
  Administered 2014-01-21 – 2014-01-22 (×8): via INTRAVENOUS

## 2014-01-21 MED ORDER — OXYCODONE-ACETAMINOPHEN 5-325 MG PO TABS: 1.0000 | ORAL_TABLET | ORAL | Status: DC | PRN

## 2014-01-21 MED ORDER — FLEET ENEMA 7-19 GM/118ML RE ENEM: 1.0000 | ENEMA | RECTAL | Status: DC | PRN

## 2014-01-21 MED ORDER — CITRIC ACID-SODIUM CITRATE 334-500 MG/5ML PO SOLN
30.0000 mL | ORAL | Status: DC | PRN
  Administered 2014-01-22: 30 mL via ORAL
  Filled 2014-01-21: qty 15

## 2014-01-21 MED ORDER — OXYTOCIN BOLUS FROM INFUSION: 500.0000 mL | INTRAVENOUS | Status: DC

## 2014-01-21 MED ORDER — LACTATED RINGERS IV SOLN: 500.0000 mL | INTRAVENOUS | Status: DC | PRN

## 2014-01-21 MED ORDER — LIDOCAINE HCL (PF) 1 % IJ SOLN: 30.0000 mL | INTRAMUSCULAR | Status: DC | PRN

## 2014-01-21 MED ORDER — ONDANSETRON HCL 4 MG/2ML IJ SOLN: 4.0000 mg | Freq: Four times a day (QID) | INTRAMUSCULAR | Status: DC | PRN

## 2014-01-21 MED ORDER — OXYTOCIN 40 UNITS IN LACTATED RINGERS INFUSION - SIMPLE MED: 62.5000 mL/h | INTRAVENOUS | Status: DC

## 2014-01-21 MED ORDER — IBUPROFEN 600 MG PO TABS: 600.0000 mg | ORAL_TABLET | Freq: Four times a day (QID) | ORAL | Status: DC | PRN

## 2014-01-21 NOTE — Progress Notes (Signed)
Joan Heath is a 28 y.o. G2P1001 at [redacted]w[redacted]d admitted for induction of labor due to Post dates, TOLAC.  Subjective: Comfortable, regular "tightening" contractions.  Objective: BP 130/77  Pulse 94  Resp 20  Ht 5\' 6"  (1.676 m)  Wt 111.585 kg (246 lb)  BMI 39.72 kg/m2  LMP 04/15/2013       FHT:  FHR: 135 bpm, variability: minimal ,  accelerations:  Abscent,  decelerations:  Absent UC:   regular, every 3-5 minutes, palpation SVE:   Dilation: Fingertip Effacement (%): 30 - posterior, firm Station: -3 Exam by:: Fatima Blank, CNM  Labs: Lab Results  Component Value Date   WBC 14.7* 01/21/2014   HGB 12.3 01/21/2014   HCT 36.2 01/21/2014   MCV 87.0 01/21/2014   PLT 218 01/21/2014    Assessment / Plan: Induction of labor due to postterm 41 weeks, TOLAC (unable to use cytotec). Intact membranes, unfavorable cervix FT-1cm / firm / posterior.  Labor: Active management. Placed FB. May allow light laboring diet x 1 if doing well in 1 hour. Preeclampsia:  n/a Fetal Wellbeing:  Category I Pain Control:  Fentanyl IV and planning epidural I/D:  GBS negative Anticipated MOD:  NSVD  [name], DO [place], PGY-1 01/21/2014, 12:03 PM

## 2014-01-21 NOTE — H&P (Signed)
Joan Heath is a 28 y.o. female G2P1001 @[redacted]w[redacted]d  by 13 week U/S pt of [facility] presenting for IOL for postdates.  Her pregnancy is dated by 13 week ultrasound.  Her hx is significant for previous LTCS.  She reports she had PROM, then had 3 day induction, had meconium stained fluid, and did not dilate past 2 cm.  Desires TOLAC with this pregnancy.    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  She is currently diagnosed with bipolar.  She has taken antidepressants in the past but is currently taking Buspar for anxiety only.   Maternal Medical History:  Contractions: Frequency: irregular.   Perceived severity is mild.    Fetal activity: Perceived fetal activity is normal.   Last perceived fetal movement was within the past hour.    Prenatal complications: no prenatal complications   OB History   Grav Para Term Preterm Abortions TAB SAB Ect Mult Living   2 1 1       1      Past Medical History  Diagnosis Date  . Anxiety   . Bipolar 1 disorder    Past Surgical History  Procedure Laterality Date  . Cesarean section  2007  . Appendectomy    . Cholecystectomy     Family History: family history includes COPD in her maternal grandmother. There is no history of Cancer, Birth defects, Asthma, Arthritis, Alcohol abuse, Drug abuse, Diabetes, Depression, Early death, Hearing loss, Heart disease, Hyperlipidemia, Hypertension, Learning disabilities, Kidney disease, Mental illness, Mental retardation, Miscarriages / Stillbirths, Stroke, or Vision loss. Social History:  reports that she quit smoking about 6 months ago. Her smoking use included Cigarettes. She smoked 0.50 packs per day. She has never used smokeless tobacco. She reports that she uses illicit drugs (Marijuana) about twice per week. She reports that she does not drink alcohol.   Prenatal Transfer Tool  Maternal Diabetes: No Genetic Screening: Normal Maternal Ultrasounds/Referrals:  Normal Fetal Ultrasounds or other Referrals:  None Maternal Substance Abuse:  Yes:  Type: Marijuana Significant Maternal Medications:  None Significant Maternal Lab Results:  Lab values include: Group B Strep negative Other Comments:  None  Review of Systems  Constitutional: Negative for fever, chills and malaise/fatigue.  Eyes: Negative for blurred vision.  Respiratory: Negative for cough and shortness of breath.   Cardiovascular: Negative for chest pain.  Gastrointestinal: Negative for heartburn, vomiting and abdominal pain.  Genitourinary: Negative for dysuria, urgency and frequency.  Musculoskeletal: Negative.   Neurological: Negative for dizziness and headaches.  Psychiatric/Behavioral: Negative for depression.    Dilation: 1 Effacement (%): 30 Station: -3 Exam by:: Fatima Blank, CNM Blood pressure 130/77, pulse 94, resp. rate 20, height 5\' 6"  (1.676 m), weight 111.585 kg (246 lb), last menstrual period 04/15/2013. Maternal Exam:  Uterine Assessment: Contraction strength is mild.  Contraction frequency is irregular.   Abdomen: Patient reports no abdominal tenderness. Fetal presentation: vertex  Cervix: Cervix evaluated by digital exam.     Fetal Exam Fetal Monitor Review: Mode: ultrasound.   Baseline rate: 145.  Variability: moderate (6-25 bpm).   Pattern: no decelerations and accelerations present.    Fetal State Assessment: Category I - tracings are normal.     Physical Exam  Nursing note and vitals reviewed. Constitutional: She is oriented to person, place, and time. She appears well-developed and well-nourished.  Neck: Normal range of motion.  Cardiovascular: Normal rate, regular rhythm and normal heart sounds.   Respiratory: Effort normal  and breath sounds normal.  GI: Soft.  Musculoskeletal: Normal range of motion.  Neurological: She is alert and oriented to person, place, and time.  Skin: Skin is warm and dry.  Psychiatric: She has a normal mood  and affect. Her behavior is normal. Judgment and thought content normal.    Prenatal labs: ABO, Rh: --/--/B POS (04/09 0750) Antibody: NEG (04/09 0750) Rubella: 2.32 (08/25 1140) RPR: NON REAC (08/25 1140)  HBsAg: NEGATIVE (08/25 1140)  HIV: NON REACTIVE (08/25 1140)  GBS: Negative (03/09 0000)  GTT: 1 hour 137, normal 3 hour (71, 159, 128, 46)  Assessment/Plan: G2P1001 @41 .0 by LMP  IOL for postdates Hx LTCS desires TOLAC GBS negative  Plan to place foley bulb for cervical ripening then continue induction with Pitocin Continuous EFM   Joan Heath A Joan Heath 01/21/2014, 10:36 AM

## 2014-01-21 NOTE — Progress Notes (Signed)
Joan Heath is a 28 y.o. G2P1001 at [redacted]w[redacted]d admitted for induction of labor due to Post dates. Due date 01/14/14.  Subjective: Pt comfortable at this time.  Family at bedside for support.  Objective: BP 134/78  Pulse 89  Temp(Src) 97.9 F (36.6 C) (Oral)  Resp 20  Ht 5\' 6"  (1.676 m)  Wt 111.585 kg (246 lb)  BMI 39.72 kg/m2  LMP 04/15/2013      FHT:  FHR: 140 bpm, variability: moderate,  accelerations:  Present,  decelerations:  Absent UC:   irregular, every 5-10 minutes SVE:  Deferred--tension applied to foley bulb  Labs: Lab Results  Component Value Date   WBC 14.7* 01/21/2014   HGB 12.3 01/21/2014   HCT 36.2 01/21/2014   MCV 87.0 01/21/2014   PLT 218 01/21/2014    Assessment / Plan: Induction of labor due to postterm FB in place  Labor: Progressing normally Preeclampsia:  n/a Fetal Wellbeing:  Category I Pain Control:  Labor support without medications I/D:  n/a Anticipated MOD:  VBAC  Lisa A Leftwich-Kirby 01/21/2014, 4:11 PM

## 2014-01-21 NOTE — Progress Notes (Signed)
Joan Heath is a 28 y.o. G2P1001 at [redacted]w[redacted]d admitted for induction of labor due to Post dates. Due date 01/14/14.  She has hx of LTCS x1 and desires TOLAC with this pregnancy.  Subjective: Pt comfortable, s/o in room for support.  Objective: BP 130/77  Pulse 94  Resp 20  Ht 5\' 6"  (1.676 m)  Wt 111.585 kg (246 lb)  BMI 39.72 kg/m2  LMP 04/15/2013      FHT:  FHR: 145 bpm, variability: moderate,  accelerations:  Present,  decelerations:  Absent UC:   irregular, every 15 minutes SVE:   Dilation: 1 Effacement (%): 30 Station: -3 Exam by:: Joan Heath, CNM Foley bulb placed in second attempt by Joan Heath, CNM, cervix posterior and firm, balloon filled to 60cc by RN.  Pt tolerated procedure well  Labs: Lab Results  Component Value Date   WBC 14.7* 01/21/2014   HGB 12.3 01/21/2014   HCT 36.2 01/21/2014   MCV 87.0 01/21/2014   PLT 218 01/21/2014    Assessment / Plan: Induction of labor due to postterm Foley bulb in place  Labor: Progressing normally Preeclampsia:  n/a Fetal Wellbeing:  Category I Pain Control:  Labor support without medications I/D:  n/a Anticipated MOD:  VBAC  Joan Heath 01/21/2014, 12:29 PM

## 2014-01-21 NOTE — Progress Notes (Signed)
I have seen this patient and agree with the above resident's note.  LEFTWICH-KIRBY, Wanisha Shiroma Certified Nurse-Midwife 

## 2014-01-22 ENCOUNTER — Encounter (HOSPITAL_COMMUNITY): Payer: Self-pay | Admitting: *Deleted

## 2014-01-22 ENCOUNTER — Encounter (HOSPITAL_COMMUNITY): Payer: Medicaid Other | Admitting: Anesthesiology

## 2014-01-22 ENCOUNTER — Inpatient Hospital Stay (HOSPITAL_COMMUNITY): Payer: Medicaid Other | Admitting: Anesthesiology

## 2014-01-22 ENCOUNTER — Encounter (HOSPITAL_COMMUNITY)
Admission: RE | Disposition: A | Discharge status: Home / Self Care | Payer: Self-pay | Source: Ambulatory Visit | Attending: Family Medicine

## 2014-01-22 DIAGNOSIS — O480 Post-term pregnancy: Secondary | ICD-10-CM

## 2014-01-22 DIAGNOSIS — Z302 Encounter for sterilization: Secondary | ICD-10-CM

## 2014-01-22 SURGERY — Surgical Case
Anesthesia: Spinal | Site: Abdomen

## 2014-01-22 MED ORDER — METOCLOPRAMIDE HCL 5 MG/ML IJ SOLN: 10.0000 mg | Freq: Once | INTRAMUSCULAR | Status: AC | PRN

## 2014-01-22 MED ORDER — LACTATED RINGERS IV SOLN
40.0000 [IU] | INTRAVENOUS | Status: DC | PRN
  Administered 2014-01-22: 40 [IU] via INTRAVENOUS

## 2014-01-22 MED ORDER — PHENYLEPHRINE HCL 10 MG/ML IJ SOLN
INTRAMUSCULAR | Status: AC
  Filled 2014-01-22: qty 1

## 2014-01-22 MED ORDER — OXYTOCIN 40 UNITS IN LACTATED RINGERS INFUSION - SIMPLE MED: 1.0000 m[IU]/min | INTRAVENOUS | Status: DC

## 2014-01-22 MED ORDER — MORPHINE SULFATE 0.5 MG/ML IJ SOLN
INTRAMUSCULAR | Status: AC
  Filled 2014-01-22: qty 10

## 2014-01-22 MED ORDER — ONDANSETRON HCL 4 MG/2ML IJ SOLN
INTRAMUSCULAR | Status: DC | PRN
  Administered 2014-01-22: 4 mg via INTRAVENOUS

## 2014-01-22 MED ORDER — CEFAZOLIN SODIUM-DEXTROSE 2-3 GM-% IV SOLR
2.0000 g | INTRAVENOUS | Status: AC
  Administered 2014-01-22: 2 g via INTRAVENOUS
  Filled 2014-01-22: qty 50

## 2014-01-22 MED ORDER — KETOROLAC TROMETHAMINE 30 MG/ML IJ SOLN
30.0000 mg | Freq: Four times a day (QID) | INTRAMUSCULAR | Status: AC | PRN
Start: 2014-01-22 — End: 2014-01-23
  Administered 2014-01-23: 30 mg via INTRAVENOUS

## 2014-01-22 MED ORDER — FENTANYL CITRATE 0.05 MG/ML IJ SOLN: 25.0000 ug | INTRAMUSCULAR | Status: DC | PRN

## 2014-01-22 MED ORDER — MEPERIDINE HCL 25 MG/ML IJ SOLN: 6.2500 mg | INTRAMUSCULAR | Status: DC | PRN

## 2014-01-22 MED ORDER — FENTANYL CITRATE 0.05 MG/ML IJ SOLN
INTRAMUSCULAR | Status: DC | PRN
  Administered 2014-01-22: 75 ug via INTRAVENOUS

## 2014-01-22 MED ORDER — FENTANYL CITRATE 0.05 MG/ML IJ SOLN
INTRAMUSCULAR | Status: DC | PRN
  Administered 2014-01-22: 25 ug via INTRATHECAL

## 2014-01-22 MED ORDER — ERYTHROMYCIN 5 MG/GM OP OINT
TOPICAL_OINTMENT | OPHTHALMIC | Status: AC
Start: 2014-01-22 — End: 2014-01-23
  Filled 2014-01-22: qty 1

## 2014-01-22 MED ORDER — BUPIVACAINE IN DEXTROSE 0.75-8.25 % IT SOLN
INTRATHECAL | Status: DC | PRN
  Administered 2014-01-22: 11.5 mg via INTRATHECAL

## 2014-01-22 MED ORDER — ONDANSETRON HCL 4 MG/2ML IJ SOLN
INTRAMUSCULAR | Status: AC
  Filled 2014-01-22: qty 2

## 2014-01-22 MED ORDER — BUPIVACAINE HCL (PF) 0.25 % IJ SOLN
INTRAMUSCULAR | Status: DC | PRN
  Administered 2014-01-22: 10 mL

## 2014-01-22 MED ORDER — OXYTOCIN 10 UNIT/ML IJ SOLN
INTRAMUSCULAR | Status: AC
  Filled 2014-01-22: qty 4

## 2014-01-22 MED ORDER — SCOPOLAMINE 1 MG/3DAYS TD PT72
1.0000 | MEDICATED_PATCH | Freq: Once | TRANSDERMAL | Status: DC
Start: 2014-01-23 — End: 2014-01-24
  Administered 2014-01-23: 1.5 mg via TRANSDERMAL

## 2014-01-22 MED ORDER — PHENYLEPHRINE 8 MG IN D5W 100 ML (0.08MG/ML) PREMIX OPTIME
INJECTION | INTRAVENOUS | Status: DC | PRN
  Administered 2014-01-22: 60 ug/min via INTRAVENOUS

## 2014-01-22 MED ORDER — KETOROLAC TROMETHAMINE 30 MG/ML IJ SOLN: 30.0000 mg | Freq: Four times a day (QID) | INTRAMUSCULAR | Status: AC | PRN

## 2014-01-22 MED ORDER — OXYTOCIN 40 UNITS IN LACTATED RINGERS INFUSION - SIMPLE MED
1.0000 m[IU]/min | INTRAVENOUS | Status: DC
  Administered 2014-01-22: 1 m[IU]/min via INTRAVENOUS
  Filled 2014-01-22: qty 1000

## 2014-01-22 MED ORDER — MORPHINE SULFATE (PF) 0.5 MG/ML IJ SOLN
INTRAMUSCULAR | Status: DC | PRN
  Administered 2014-01-22: .15 mg via INTRATHECAL

## 2014-01-22 MED ORDER — FENTANYL CITRATE 0.05 MG/ML IJ SOLN
INTRAMUSCULAR | Status: AC
  Filled 2014-01-22: qty 2

## 2014-01-22 MED ORDER — BUPIVACAINE HCL (PF) 0.25 % IJ SOLN
INTRAMUSCULAR | Status: AC
  Filled 2014-01-22: qty 30

## 2014-01-22 SURGICAL SUPPLY — 27 items
CLAMP CORD UMBIL (MISCELLANEOUS) IMPLANT
CLIP FILSHIE TUBAL LIGA STRL (Clip) ×3 IMPLANT
CLOTH BEACON ORANGE TIMEOUT ST (SAFETY) ×3 IMPLANT
DRAPE LG THREE QUARTER DISP (DRAPES) IMPLANT
DRSG OPSITE POSTOP 4X10 (GAUZE/BANDAGES/DRESSINGS) ×3 IMPLANT
DURAPREP 26ML APPLICATOR (WOUND CARE) ×3 IMPLANT
ELECT REM PT RETURN 9FT ADLT (ELECTROSURGICAL) ×3
ELECTRODE REM PT RTRN 9FT ADLT (ELECTROSURGICAL) ×2 IMPLANT
EXTRACTOR VACUUM M CUP 4 TUBE (SUCTIONS) IMPLANT
GLOVE BIOGEL PI IND STRL 7.5 (GLOVE) ×4 IMPLANT
GLOVE BIOGEL PI INDICATOR 7.5 (GLOVE) ×2
GLOVE ECLIPSE 7.5 STRL STRAW (GLOVE) ×3 IMPLANT
GOWN STRL REUS W/TWL LRG LVL3 (GOWN DISPOSABLE) ×9 IMPLANT
KIT ABG SYR 3ML LUER SLIP (SYRINGE) IMPLANT
NEEDLE HYPO 22GX1.5 SAFETY (NEEDLE) ×3 IMPLANT
NEEDLE HYPO 25X5/8 SAFETYGLIDE (NEEDLE) IMPLANT
NS IRRIG 1000ML POUR BTL (IV SOLUTION) ×3 IMPLANT
PACK C SECTION WH (CUSTOM PROCEDURE TRAY) ×3 IMPLANT
PAD OB MATERNITY 4.3X12.25 (PERSONAL CARE ITEMS) ×3 IMPLANT
RTRCTR C-SECT PINK 25CM LRG (MISCELLANEOUS) IMPLANT
SUT VIC AB 0 CTX 36 (SUTURE) ×4
SUT VIC AB 0 CTX36XBRD ANBCTRL (SUTURE) ×8 IMPLANT
SUT VIC AB 4-0 KS 27 (SUTURE) ×3 IMPLANT
SYR 30ML LL (SYRINGE) ×3 IMPLANT
TOWEL OR 17X24 6PK STRL BLUE (TOWEL DISPOSABLE) ×3 IMPLANT
TRAY FOLEY CATH 14FR (SET/KITS/TRAYS/PACK) ×3 IMPLANT
WATER STERILE IRR 1000ML POUR (IV SOLUTION) ×3 IMPLANT

## 2014-01-22 NOTE — Anesthesia Preprocedure Evaluation (Signed)
Anesthesia Evaluation  Patient identified by MRN, date of birth, ID band Patient awake    Reviewed: Allergy & Precautions, H&P , NPO status , Patient's Chart, lab work & pertinent test results  Airway Mallampati: III TM Distance: >3 FB Neck ROM: Full    Dental no notable dental hx. (+) Teeth Intact   Pulmonary former smoker,  breath sounds clear to auscultation  Pulmonary exam normal       Cardiovascular negative cardio ROS  Rhythm:Regular Rate:Normal     Neuro/Psych  Headaches, PSYCHIATRIC DISORDERS Anxiety Depression Bipolar Disorder    GI/Hepatic Neg liver ROS, GERD-  ,  Endo/Other  Morbid obesity  Renal/GU   negative genitourinary   Musculoskeletal   Abdominal (+) + obese,   Peds  Hematology negative hematology ROS (+)   Anesthesia Other Findings   Reproductive/Obstetrics (+) Pregnancy Previous C/Section Desires sterilization Failed induction                           Anesthesia Physical Anesthesia Plan  ASA: III and emergent  Anesthesia Plan: Spinal   Post-op Pain Management:    Induction:   Airway Management Planned: Natural Airway  Additional Equipment:   Intra-op Plan:   Post-operative Plan:   Informed Consent: I have reviewed the patients History and Physical, chart, labs and discussed the procedure including the risks, benefits and alternatives for the proposed anesthesia with the patient or authorized representative who has indicated his/her understanding and acceptance.   Dental advisory given  Plan Discussed with: CRNA, Anesthesiologist and Surgeon  Anesthesia Plan Comments:         Anesthesia Quick Evaluation

## 2014-01-22 NOTE — Op Note (Signed)
Attestation of Attending Supervision of Obstetric Fellow During Surgery: Surgery was performed by the Obstetric Fellow under my supervision and collaboration.  I have reviewed the Obstetric Fellow's operative report, and I agree with the documentation.  I was present and scrubbed for the entire procedure.    Klaire Court, DO Attending Physician Faculty Practice, [facility]  

## 2014-01-22 NOTE — Op Note (Signed)
[name] PROCEDURE DATE: 01/21/2014 - 01/22/2014  PREOPERATIVE DIAGNOSES: Intrauterine pregnancy at  [redacted]w[redacted]d weeks gestation; failed TOLAC, failure to dilate after having been induced for postdates and 36 hours of trying to induce labor. undesired fertility  POSTOPERATIVE DIAGNOSES: The same  PROCEDURE: Repeat Low Transverse Cesarean Section  SURGEON:  Dr. Loma Boston  ASSISTANT:  Dr. Ebbie Latus  ANESTHESIOLOGIST: Dr. Royce Macadamia  INDICATIONS: Joan Heath is a 28 y.o. G2P1001 at [redacted]w[redacted]d here for cesarean section and bilateral tubal sterilization secondary to the indications listed under preoperative diagnoses; please see preoperative note for further details.  The risks of surgery were discussed with the patient including but were not limited to: bleeding which may require transfusion or reoperation; infection which may require antibiotics; injury to bowel, bladder, ureters or other surrounding organs; injury to the fetus; need for additional procedures including hysterectomy in the event of a life-threatening hemorrhage; placental abnormalities wth subsequent pregnancies, incisional problems, thromboembolic phenomenon and other postoperative/anesthesia complications.  Patient also desires permanent sterilization.  Other reversible forms of contraception were discussed with patient; she declines all other modalities. Risks of procedure discussed with patient including but not limited to: risk of regret, permanence of method, bleeding, infection, injury to surrounding organs and need for additional procedures.  Failure risk of 1-2% with increased risk of ectopic gestation if pregnancy occurs was also discussed with patient.  The patient concurred with the proposed plan, giving informed written consent for the procedures.    FINDINGS:  Viable female infant in cephalic presentation.  Apgars 7 and 9.  Clear amniotic fluid.  Intact placenta, three vessel cord.  Normal uterus, fallopian tubes and ovaries  bilaterally. Fallopian tubes sterilized with Filshie clips bilaterally.  ANESTHESIA: Spinal INTRAVENOUS FLUIDS: 1500 ml ESTIMATED BLOOD LOSS: 800 ml URINE OUTPUT:  200 ml SPECIMENS: Placenta sent to L&D COMPLICATIONS: None immediate  PROCEDURE IN DETAIL:  The patient preoperatively received intravenous antibiotics and had sequential compression devices applied to her lower extremities.   She was then taken to the operating room where spinal anesthesia was administered and was found to be adequate. She was then placed in a dorsal supine position with a leftward tilt, and prepped and draped in a sterile manner.  A foley catheter was placed into her bladder and attached to constant gravity.  After an adequate timeout was performed, a Pfannenstiel skin incision was made with scalpel and carried through to the underlying layer of fascia. The fascia was incised in the midline, and this incision was extended bilaterally using the Mayo scissors.  Kocher clamps were applied to the superior aspect of the fascial incision and the underlying rectus muscles were dissected off bluntly. The rectus muscles were separated in the midline bluntly and the peritoneum was entered bluntly. Attention was turned to the lower uterine segment where a low transverse hysterotomy was made with a scalpel and extended bilaterally bluntly.  Bandage scissors were then used to extend the uterine incision. The infant was successfully delivered, the cord was clamped and cut and the infant was handed over to awaiting neonatology team. Uterine massage was then administered, and the placenta delivered intact with a three-vessel cord. The uterus was then cleared of clot and debris.  The hysterotomy was closed with 0 Vicryl in a running locked fashion, and an imbricating layer was also placed with 0 Vicryl.  Attention was then turned to the fallopian tubes, about 3 cm from the cornua where a filshie clip was placed on each side, with care given to  incorporate the underlying mesosalpinx on both sides, allowing for bilateral tubal sterilization. The pelvis was cleared of all clot and debris. Hemostasis was confirmed on all surfaces.  The fascia was then closed using 0 Vicryl in a running fashion.  The subcutaneous layer was irrigated and reapproximated with 0 plain gut interrupted stiches and 10 ml of 0.25% Marcaine was injected subcutaneously around the incision.  The skin was closed with a 4-0 Vicryl subcuticular stitch. The patient tolerated the procedure well. Sponge, lap, instrument and needle counts were correct x 2.  She was taken to the recovery room in stable condition.   Kassie Mends, MD

## 2014-01-22 NOTE — Consult Note (Signed)
Neonatology Note:   Attendance at C-section:    I was asked by Dr. Nehemiah Settle to attend this repeat C/S at 41 weeks due to failed TOLAC. The mother is a G2P1 B pos, GBS neg with bipolar disorder (no meds currently) and obseity. ROM at delivery, fluid clear. Vacuum-assisted delivery.  Infant was a little floppy but recovered quickly with stimulation. By 1 minute, she was vigorous with good spontaneous cry and tone. Needed only minimal bulb suctioning. Ap 7/9. Lungs clear to ausc in DR. To CN to care of Pediatrician.   Real Cons, MD

## 2014-01-22 NOTE — Progress Notes (Signed)
S:  Called to talk to pt as she is tired of the induction process. Has been here for 36 hours and making very little change.  States is ready to have a cesarean section as this is what happened last time and eventually required a LTCS.    FHT- cat II tracing TOCO- rare and irregular  A/P Cervix unchanged on exam today  The risks of cesarean section discussed with the patient included but were not limited to: bleeding which may require transfusion or reoperation; infection which may require antibiotics; injury to bowel, bladder, ureters or other surrounding organs; injury to the fetus; need for additional procedures including hysterectomy in the event of a life-threatening hemorrhage; placental abnormalities wth subsequent pregnancies, incisional problems, thromboembolic phenomenon and other postoperative/anesthesia complications. The patient concurred with the proposed plan, giving informed written consent for the procedure.   Patient has been NPO since 11pm she will remain NPO for procedure. Anesthesia and OR aware. Preoperative prophylactic antibiotics and SCDs ordered on call to the OR.  To OR when ready.  Patient desires permanent sterilization.  Other reversible forms of contraception were discussed with patient; she declines all other modalities. Risks of procedure discussed with patient including but not limited to: risk of regret, permanence of method, bleeding, infection, injury to surrounding organs and need for additional procedures.  Failure risk of 1-2 % with increased risk of ectopic gestation if pregnancy occurs was also discussed with patient.  Patient verbalized understanding of these risks and wants to proceed with sterilization.  Written informed consent obtained.  To OR when ready.

## 2014-01-22 NOTE — Progress Notes (Signed)
   Joan Heath is a 28 y.o. G2P1001 at [redacted]w[redacted]d  admitted for induction of labor due to Post dates. TOLAC  Subjective:  No c/o.  Hungry  Objective: BP 128/53  Pulse 87  Temp(Src) 98.4 F (36.9 C) (Oral)  Resp 18  Ht 5\' 6"  (1.676 m)  Wt 111.585 kg (246 lb)  BMI 39.72 kg/m2  LMP 04/15/2013    FHT:  FHR: 140 bpm, variability: moderate,  accelerations:  Present,  decelerations:  Absent UC:   None Foley out SVE:   Dilation: 4 Effacement (%): 50 Station: -3 Exam by:: F. Cresenzo-Dishmon, CNM  Labs: Lab Results  Component Value Date   WBC 14.7* 01/21/2014   HGB 12.3 01/21/2014   HCT 36.2 01/21/2014   MCV 87.0 01/21/2014   PLT 218 01/21/2014    Assessment / Plan: IOL for post dates, ripening phase complete Will eat light laboring meal, then start pitocin Labor: Progressing normally Fetal Wellbeing:  Category I Pain Control:  Labor support without medications Anticipated MOD:  NSVD  Christin Fudge 01/22/2014, 8:01 AM

## 2014-01-22 NOTE — Progress Notes (Signed)
Patient ID: Joan Heath, female   DOB: 1986/10/11, 28 y.o.   MRN: [id number] Joan Heath is a 28 y.o. G2P1001 at [redacted]w[redacted]d admitted for IOL.   Subjective: Uncomfortable with UCs for past 1 1/2 hrs. Couple discussing whether to proceed with TOLAC  Objective: BP 136/72  Pulse 79  Temp(Src) 98.2 F (36.8 C) (Oral)  Resp 20  Ht 5\' 6"  (1.676 m)  Wt 111.585 kg (246 lb)  BMI 39.72 kg/m2  LMP 04/15/2013  Fetal Heart FHR: 140 bpm, variability: moderate,  accelerations:  Present,  decelerations:  Absent   Contractions: q 3- 4 Pitocin at 11 mu  SVE:   Dilation: 3 Effacement (%): 40 Station: -3 Exam by:: Laney Pastor, CNM  Assessment / Plan:  Labor: early Fetal Wellbeing: Category 1 Pain Control:  offerred analgesia Expected mode of delivery: NSVD  Lennart Gladish C Abbygael Curtiss 01/22/2014, 3:01 PM

## 2014-01-22 NOTE — H&P (Signed)
Attestation of Attending Supervision of Advanced Practitioner (PA/CNM/NP): Evaluation and management procedures were performed by the Advanced Practitioner under my supervision and collaboration.  I have reviewed the Advanced Practitioner's note and chart, and I agree with the management and plan.  Tempestt Silba, DO Attending Physician Faculty Practice, [facility]  

## 2014-01-22 NOTE — Anesthesia Procedure Notes (Signed)
Spinal  Patient location during procedure: OR Start time: 01/22/2014 10:05 PM Staffing Anesthesiologist: Kalianne Fetting A. Performed by: anesthesiologist  Preanesthetic Checklist Completed: patient identified, site marked, surgical consent, pre-op evaluation, timeout performed, IV checked, risks and benefits discussed and monitors and equipment checked Spinal Block Patient position: sitting Prep: site prepped and draped and DuraPrep Patient monitoring: heart rate, cardiac monitor, continuous pulse ox and blood pressure Approach: midline Location: L3-4 Injection technique: single-shot Needle Needle type: Sprotte  Needle gauge: 24 G Needle length: 9 cm Needle insertion depth: 7 cm Assessment Sensory level: T4 Additional Notes Patient tolerated procedure well. Adequate sensory level.

## 2014-01-22 NOTE — Progress Notes (Signed)
   Joan Heath is a 28 y.o. G2P1001 at [redacted]w[redacted]d  admitted for induction of labor due to Post dates..  Subjective: Not feeling any contractions anymore  Objective: BP 122/69  Pulse 90  Temp(Src) 98.1 F (36.7 C) (Oral)  Resp 18  Ht 5\' 6"  (1.676 m)  Wt 111.585 kg (246 lb)  BMI 39.72 kg/m2  LMP 04/15/2013    FHT:  FHR: 140 bpm, variability: moderate,  accelerations:  Present,  decelerations:  Absent Foley still in UC:   none SVE:   Dilation: 1.5 Effacement (%): 30 Station: -3 Exam by:: Cres-Dishmon, CNM   Labs: Lab Results  Component Value Date   WBC 14.7* 01/21/2014   HGB 12.3 01/21/2014   HCT 36.2 01/21/2014   MCV 87.0 01/21/2014   PLT 218 01/21/2014    Assessment / Plan: IOL for postdates  Labor: ripening phase Fetal Wellbeing:  Category I Pain Control:  Labor support without medications Anticipated MOD:  NSVD  Joan Heath 01/22/2014, 12:55 AM

## 2014-01-22 NOTE — Transfer of Care (Addendum)
Immediate Anesthesia Transfer of Care Note  Patient: Joan Heath  Procedure(s) Performed: Procedure(s): CESAREAN SECTION WITH BILATERAL TUBAL LIGATION (N/A)  Patient Location: PACU  Anesthesia Type:Spinal  Level of Consciousness: awake, alert , oriented and patient cooperative  Airway & Oxygen Therapy: Patient Spontanous Breathing  Post-op Assessment: Report given to PACU RN and Post -op Vital signs reviewed and stable  Post vital signs: Reviewed and stable  Complications: No apparent anesthesia complications

## 2014-01-22 NOTE — Progress Notes (Signed)
Patient ID: Joan Heath, female   DOB: [birth year], 28 y.o.   MRN: [id number] Joan Heath is a 28 y.o. G2P1001 at [redacted]w[redacted]d admitted for TOLAC, induction for postdates  Subjective: More comfortable than earlier. She is requesting C?S if no cx change at 2000. NPO.   Objective: BP 119/81  Pulse 87  Temp(Src) 98 F (36.7 C) (Oral)  Resp 18  Ht 5\' 6"  (1.676 m)  Wt 111.585 kg (246 lb)  BMI 39.72 kg/m2  LMP 04/15/2013  Fetal Heart FHR: 140 bpm, variability: moderate,  accelerations:  Present,  decelerations:  Absent   Contractions: Not tracing well as pt sitting upright and on birthing ball, about q 3-4 Pitocin at 17 mu  SVE:   Dilation: 3 Effacement (%): 40 Station: -3 Exam by:: Laney Pastor, CNM Not rechecked Assessment / Plan:  Labor: early Fetal Wellbeing: Category 1 Pain Control:  n/a Expected mode of delivery: probably ERCS Dr. Nehemiah Settle aware  Trea Latner C Shunte Senseney 01/22/2014, 7:30 PM

## 2014-01-23 ENCOUNTER — Encounter (HOSPITAL_COMMUNITY): Payer: Self-pay | Admitting: *Deleted

## 2014-01-23 DIAGNOSIS — Z98891 History of uterine scar from previous surgery: Secondary | ICD-10-CM

## 2014-01-23 LAB — CBC
HCT: 33.3 % — ABNORMAL LOW (ref 36.0–46.0)
Hemoglobin: 11.1 g/dL — ABNORMAL LOW (ref 12.0–15.0)
MCH: 29.1 pg (ref 26.0–34.0)
MCHC: 33.3 g/dL (ref 30.0–36.0)
MCV: 87.4 fL (ref 78.0–100.0)
PLATELETS: 175 10*3/uL (ref 150–400)
RBC: 3.81 MIL/uL — AB (ref 3.87–5.11)
RDW: 13.7 % (ref 11.5–15.5)
WBC: 13.6 10*3/uL — ABNORMAL HIGH (ref 4.0–10.5)

## 2014-01-23 MED ORDER — WITCH HAZEL-GLYCERIN EX PADS: 1.0000 "application " | MEDICATED_PAD | CUTANEOUS | Status: DC | PRN

## 2014-01-23 MED ORDER — LACTATED RINGERS IV SOLN
INTRAVENOUS | Status: DC
  Administered 2014-01-23: 12:00:00 via INTRAVENOUS

## 2014-01-23 MED ORDER — DIPHENHYDRAMINE HCL 25 MG PO CAPS: 25.0000 mg | ORAL_CAPSULE | Freq: Four times a day (QID) | ORAL | Status: DC | PRN

## 2014-01-23 MED ORDER — IBUPROFEN 600 MG PO TABS
600.0000 mg | ORAL_TABLET | Freq: Four times a day (QID) | ORAL | Status: DC
  Administered 2014-01-23 – 2014-01-24 (×7): 600 mg via ORAL
  Filled 2014-01-23 (×7): qty 1

## 2014-01-23 MED ORDER — FAMOTIDINE 20 MG PO TABS
20.0000 mg | ORAL_TABLET | Freq: Every day | ORAL | Status: DC
  Administered 2014-01-23: 20 mg via ORAL
  Filled 2014-01-23: qty 1

## 2014-01-23 MED ORDER — DIPHENHYDRAMINE HCL 50 MG/ML IJ SOLN: 25.0000 mg | INTRAMUSCULAR | Status: DC | PRN

## 2014-01-23 MED ORDER — KETOROLAC TROMETHAMINE 30 MG/ML IJ SOLN
INTRAMUSCULAR | Status: AC
  Filled 2014-01-23: qty 1

## 2014-01-23 MED ORDER — SIMETHICONE 80 MG PO CHEW: 80.0000 mg | CHEWABLE_TABLET | ORAL | Status: DC | PRN

## 2014-01-23 MED ORDER — NALBUPHINE HCL 10 MG/ML IJ SOLN
INTRAMUSCULAR | Status: AC
  Filled 2014-01-23: qty 1

## 2014-01-23 MED ORDER — CYCLOBENZAPRINE HCL 10 MG PO TABS
10.0000 mg | ORAL_TABLET | Freq: Two times a day (BID) | ORAL | Status: DC | PRN
  Filled 2014-01-23: qty 1

## 2014-01-23 MED ORDER — DIPHENHYDRAMINE HCL 50 MG/ML IJ SOLN
INTRAMUSCULAR | Status: AC
  Filled 2014-01-23: qty 1

## 2014-01-23 MED ORDER — ZOLPIDEM TARTRATE 5 MG PO TABS: 5.0000 mg | ORAL_TABLET | Freq: Every evening | ORAL | Status: DC | PRN

## 2014-01-23 MED ORDER — PRENATAL MULTIVITAMIN CH
1.0000 | ORAL_TABLET | Freq: Every day | ORAL | Status: DC
  Administered 2014-01-23 – 2014-01-24 (×2): 1 via ORAL
  Filled 2014-01-23 (×2): qty 1

## 2014-01-23 MED ORDER — MENTHOL 3 MG MT LOZG: 1.0000 | LOZENGE | OROMUCOSAL | Status: DC | PRN

## 2014-01-23 MED ORDER — METOCLOPRAMIDE HCL 5 MG/ML IJ SOLN: 10.0000 mg | Freq: Three times a day (TID) | INTRAMUSCULAR | Status: DC | PRN

## 2014-01-23 MED ORDER — LANOLIN HYDROUS EX OINT: 1.0000 "application " | TOPICAL_OINTMENT | CUTANEOUS | Status: DC | PRN

## 2014-01-23 MED ORDER — DIPHENHYDRAMINE HCL 50 MG/ML IJ SOLN
12.5000 mg | INTRAMUSCULAR | Status: DC | PRN
  Administered 2014-01-23: 12.5 mg via INTRAVENOUS

## 2014-01-23 MED ORDER — OXYCODONE-ACETAMINOPHEN 5-325 MG PO TABS: 1.0000 | ORAL_TABLET | ORAL | Status: DC | PRN

## 2014-01-23 MED ORDER — SUMATRIPTAN SUCCINATE 100 MG PO TABS
100.0000 mg | ORAL_TABLET | Freq: Once | ORAL | Status: AC | PRN
  Filled 2014-01-23: qty 1

## 2014-01-23 MED ORDER — SCOPOLAMINE 1 MG/3DAYS TD PT72
MEDICATED_PATCH | TRANSDERMAL | Status: AC
  Filled 2014-01-23: qty 1

## 2014-01-23 MED ORDER — SIMETHICONE 80 MG PO CHEW
80.0000 mg | CHEWABLE_TABLET | ORAL | Status: DC
  Administered 2014-01-23 – 2014-01-24 (×2): 80 mg via ORAL
  Filled 2014-01-23 (×3): qty 1

## 2014-01-23 MED ORDER — TRAMADOL HCL 50 MG PO TABS
50.0000 mg | ORAL_TABLET | Freq: Four times a day (QID) | ORAL | Status: DC | PRN
  Administered 2014-01-23 – 2014-01-24 (×2): 100 mg via ORAL
  Filled 2014-01-23 (×2): qty 2

## 2014-01-23 MED ORDER — SIMETHICONE 80 MG PO CHEW
80.0000 mg | CHEWABLE_TABLET | Freq: Three times a day (TID) | ORAL | Status: DC
  Administered 2014-01-23 – 2014-01-24 (×6): 80 mg via ORAL
  Filled 2014-01-23 (×6): qty 1

## 2014-01-23 MED ORDER — NALOXONE HCL 0.4 MG/ML IJ SOLN: 0.4000 mg | INTRAMUSCULAR | Status: DC | PRN

## 2014-01-23 MED ORDER — TETANUS-DIPHTH-ACELL PERTUSSIS 5-2.5-18.5 LF-MCG/0.5 IM SUSP: 0.5000 mL | Freq: Once | INTRAMUSCULAR | Status: DC

## 2014-01-23 MED ORDER — BUSPIRONE HCL 5 MG PO TABS
5.0000 mg | ORAL_TABLET | ORAL | Status: DC | PRN
  Filled 2014-01-23: qty 1

## 2014-01-23 MED ORDER — NALBUPHINE HCL 10 MG/ML IJ SOLN: 5.0000 mg | INTRAMUSCULAR | Status: DC | PRN

## 2014-01-23 MED ORDER — ONDANSETRON HCL 4 MG/2ML IJ SOLN: 4.0000 mg | INTRAMUSCULAR | Status: DC | PRN

## 2014-01-23 MED ORDER — SODIUM CHLORIDE 0.9 % IJ SOLN: 3.0000 mL | INTRAMUSCULAR | Status: DC | PRN

## 2014-01-23 MED ORDER — ONDANSETRON HCL 4 MG PO TABS: 4.0000 mg | ORAL_TABLET | ORAL | Status: DC | PRN

## 2014-01-23 MED ORDER — SENNOSIDES-DOCUSATE SODIUM 8.6-50 MG PO TABS
2.0000 | ORAL_TABLET | ORAL | Status: DC
  Administered 2014-01-23 – 2014-01-24 (×2): 2 via ORAL
  Filled 2014-01-23 (×2): qty 2

## 2014-01-23 MED ORDER — DIBUCAINE 1 % RE OINT: 1.0000 "application " | TOPICAL_OINTMENT | RECTAL | Status: DC | PRN

## 2014-01-23 MED ORDER — NALOXONE HCL 1 MG/ML IJ SOLN
1.0000 ug/kg/h | INTRAVENOUS | Status: DC | PRN
  Filled 2014-01-23: qty 2

## 2014-01-23 MED ORDER — NALBUPHINE HCL 10 MG/ML IJ SOLN
5.0000 mg | INTRAMUSCULAR | Status: DC | PRN
  Administered 2014-01-23: 10 mg via INTRAVENOUS

## 2014-01-23 MED ORDER — DIPHENHYDRAMINE HCL 25 MG PO CAPS
25.0000 mg | ORAL_CAPSULE | ORAL | Status: DC | PRN
  Filled 2014-01-23: qty 1

## 2014-01-23 MED ORDER — OXYTOCIN 40 UNITS IN LACTATED RINGERS INFUSION - SIMPLE MED: 62.5000 mL/h | INTRAVENOUS | Status: AC

## 2014-01-23 MED ORDER — ONDANSETRON HCL 4 MG/2ML IJ SOLN: 4.0000 mg | Freq: Three times a day (TID) | INTRAMUSCULAR | Status: DC | PRN

## 2014-01-23 NOTE — Anesthesia Postprocedure Evaluation (Signed)
  Anesthesia Post-op Note  Patient: Joan Heath  Procedure(s) Performed: Procedure(s): CESAREAN SECTION WITH BILATERAL TUBAL LIGATION (N/A)  Patient Location: PACU  Anesthesia Type:Spinal  Level of Consciousness: awake, alert  and oriented  Airway and Oxygen Therapy: Patient Spontanous Breathing  Post-op Pain: none  Post-op Assessment: Post-op Vital signs reviewed, Patient's Cardiovascular Status Stable, Respiratory Function Stable, Patent Airway, No signs of Nausea or vomiting, Pain level controlled, No headache and No backache  Post-op Vital Signs: Reviewed and stable  Last Vitals:  Filed Vitals:   01/23/14 0000  BP: 126/80  Pulse: 74  Temp:   Resp: 30    Complications: No apparent anesthesia complications

## 2014-01-23 NOTE — Lactation Note (Signed)
This note was copied from the chart of Joan Heath. Lactation Consultation Note Initial consult:   Room full of visitors.  First time BF.  Using NS. Reviewed feeding cue chart, LC/OP services and brochure. Left [place] phone number and encouraged mother to call with next feeding for assistance.   Patient Name: Joan Heath Today's Date: 01/23/2014 Reason for consult: Initial assessment   Maternal Data Does the patient have breastfeeding experience prior to this delivery?: No  Feeding Feeding Type: Breast Fed Length of feed:  (on and off for 30 minutes)  LATCH Score/Interventions Latch: Repeated attempts needed to sustain latch, nipple held in mouth throughout feeding, stimulation needed to elicit sucking reflex. Intervention(s): Skin to skin Intervention(s): Adjust position;Assist with latch  Audible Swallowing: None Intervention(s): Skin to skin  Type of Nipple: Flat Intervention(s):  (shiled given by previous shift nurse)  Comfort (Breast/Nipple): Soft / non-tender     Hold (Positioning): Assistance needed to correctly position infant at breast and maintain latch.  LATCH Score: 5  Lactation Tools Discussed/Used     Consult Status Consult Status: Follow-up Date: 01/24/14 Follow-up type: In-patient    Larry Sierras Berkelhammer 01/23/2014, 1:39 PM

## 2014-01-23 NOTE — Progress Notes (Signed)
Clinical Social Work Department PSYCHOSOCIAL ASSESSMENT - MATERNAL/CHILD 01/23/2014  Patient:  Joan Heath, Joan Heath  Account Number:  [id number]  Admit Date:  01/21/2014  Ardine Eng Name:   Campbell Stall    Clinical Social Worker:  Jazzmen Restivo, LCSW   Date/Time:  01/23/2014 11:00 AM  Date Referred:  01/23/2014   Referral source  Central Nursery     Referred reason  Behavioral Health Issues  Substance Abuse   Other referral source:    I:  FAMILY / HOME ENVIRONMENT Child's legal guardian:  PARENT  Guardian - Name Guardian - Age Solomon [place] [phone]  [place] 28    Other household support members/support persons Other support:   Currently residing with maternal grandmother    II  PSYCHOSOCIAL DATA Information Source:    Occupational hygienist Employment:   FOB expected to start working soon   Museum/gallery curator resources:  [employer] [place]:   Other  [place] / Grade:   Maternity Care Coordinator / Child Services Coordination / Early Interventions:  Cultural issues impacting care:    III  STRENGTHS Strengths  Home prepared for Child (including basic supplies)  Understanding of illness  Supportive family/friends  Adequate Resources   Strength comment:    IV  RISK FACTORS AND CURRENT PROBLEMS Current Problem:     Risk Factor & Current Problem Patient Issue Family Issue Risk Factor / Current Problem Comment  Mental Illness Y N Hx of anxiety and bipolar  Substance Abuse Y N Hx of Marijuana    V  SOCIAL WORK ASSESSMENT Acknowledged MD order for social work consult to assess mother's hx of substance abuse and mental illness. Mother's UDS was negative for marijuana on 12/2013.  She is a single parent with one other dependent age 53.  Mother states that she was experiencing increased anxiety in December and used marijuana, but has not used any since. She denies need for treatment,  and reports no other illicit drug use during pregnancy.  Mother informed of the hospital's drug screen policy.  UDS on newborn was ordered. Mother states that she was diagnosed with bipolar and anxiety.  She is reportedly on medication for the anxiety but not the bipolar.  She's only being managed with medication.     Discussed at length the benefits of therapy.   She was receptive and seemed interested. Provided her with a list of options in the community.  She reports adequate support at with maternal grandparents. Spoke with her regarding signs/symptoms of PP Depression. Encouraged treatment if needed.   Mother was informed of CSW availability.      VI SOCIAL WORK PLAN Social Work Plan  No Barriers to Discharge   Type of pt/family education:   If child protective services report - county:   If child protective services report - date:   Information/referral to community resources comment:   Other social work plan:   Will continue to monitor drug screen

## 2014-01-23 NOTE — Progress Notes (Signed)
Subjective: Postpartum Day 1: Cesarean Delivery with bTL Patient reports no pain at level of incision. Foley still in place. Has not tried to eat yet.  No passing gas yet.     Objective: Vital signs in last 24 hours: Temp:  [97.4 F (36.3 C)-99.2 F (37.3 C)] 98.3 F (36.8 C) (04/11 0802) Pulse Rate:  [70-96] 77 (04/11 0802) Resp:  [15-30] 18 (04/11 0802) BP: (100-136)/(53-81) 100/58 mmHg (04/11 0802) SpO2:  [90 %-98 %] 97 % (04/11 0802)  Physical Exam:  General: alert, cooperative and no distress Lochia: appropriate Uterine Fundus: firm Incision: pressure bandage in place. Dry.  DVT Evaluation: No cords or calf tenderness. No significant calf/ankle edema.   Recent Labs  01/21/14 0750 01/23/14 0613  HGB 12.3 11.1*  HCT 36.2 33.3*    Assessment/Plan: Status post Cesarean section with BTL. Doing well postoperatively.  D/c foley Advance diet .routine postpartum care  Margurete Guaman L Karis Rilling 01/23/2014, 8:47 AM

## 2014-01-23 NOTE — Anesthesia Postprocedure Evaluation (Signed)
Anesthesia Post Note  Patient: Joan Heath  Procedure(s) Performed: Procedure(s) (LRB): CESAREAN SECTION WITH BILATERAL TUBAL LIGATION (N/A)  Anesthesia type: Spinal  Patient location: Mother/Baby  Post pain: Pain level controlled  Post assessment: Post-op Vital signs reviewed  Last Vitals:  Filed Vitals:   01/23/14 0802  BP: 100/58  Pulse: 77  Temp: 36.8 C  Resp: 18    Post vital signs: Reviewed  Level of consciousness: awake  Complications: No apparent anesthesia complications

## 2014-01-23 NOTE — Addendum Note (Signed)
Addendum created 01/23/14 0846 by Flossie Dibble, CRNA   Modules edited: Notes Section   Notes Section:  File: [id number]

## 2014-01-24 ENCOUNTER — Ambulatory Visit: Payer: Self-pay

## 2014-01-24 MED ORDER — IBUPROFEN 600 MG PO TABS: 600.0000 mg | ORAL_TABLET | Freq: Four times a day (QID) | ORAL | Status: DC

## 2014-01-24 MED ORDER — OXYCODONE-ACETAMINOPHEN 5-325 MG PO TABS: 1.0000 | ORAL_TABLET | ORAL | Status: DC | PRN

## 2014-01-24 NOTE — Discharge Summary (Signed)
Attestation of Attending Supervision of Advanced Practitioner (PA/CNM/NP): Evaluation and management procedures were performed by the Advanced Practitioner under my supervision and collaboration.  I have reviewed the Advanced Practitioner's note and chart, and I agree with the management and plan.  Kamarie Palma, MD, FACOG Attending Obstetrician & Gynecologist Faculty Practice, [facility]  

## 2014-01-24 NOTE — Lactation Note (Signed)
This note was copied from the chart of Joan Mitsuye Schrodt. Lactation Consultation Note  Patient Name: Joan Heath GUYQI'H Date: 01/24/2014 Reason for consult: Follow-up assessment;Difficult latch.  Mom states she is following recommendations of previous LC regarding regular pumping (q3h) and mom states she is still not expressing any milk.  Mom feeding baby formula by bottle at each feeding tonight and not attempting to breastfeed but hopes to feed ebm as soon as available.  LC encouraged mom to combine pumping with breast massage and hand expression for increased stimulation of production and flow.   Maternal Data    Feeding Feeding Type: Bottle Fed - Formula  LATCH Score/Interventions            Most recent LATCH score=3, previous one was 6 but baby has latched for up to 15-20 minutes for three feedings today          Lactation Tools Discussed/Used Pump Review: Milk Storage (referred mom to page 25 in Baby and Me)   Consult Status Consult Status: Follow-up Date: 01/25/14 Follow-up type: In-patient    Landis Gandy 01/24/2014, 9:00 PM

## 2014-01-24 NOTE — Discharge Summary (Signed)
Obstetric Discharge Summary Reason for Admission: induction of labor for postdates; desired TOLAC but changed mind for elective repeat prior to onset of labor Prenatal Procedures: none Intrapartum Procedures: cesarean: low cervical, transverse and tubal ligation Postpartum Procedures: none Complications-Operative and Postpartum: none Hemoglobin  Date Value Ref Range Status  01/23/2014 11.1* 12.0 - 15.0 g/dL Final     HCT  Date Value Ref Range Status  01/23/2014 33.3* 36.0 - 46.0 % Final   [name] is a 28yo G2P1001 admitted at 41.0wks on the morning of 01/21/14 for induction of labor due to postdates. She wanted to [facility], and a foley bulb was placed as her cx was unfavorable. Pitocin was started once the foley bulb came out. By the evening of 4/10 she had not dilated significantly enough to reach active labor and she decided to have an elective repeat C/S and BTL. She tol the procedure well and on POD#2 is deemed to have received the full benefit of her hospital stay. She has a hx of depression, possibly bipolar but is not receiving tx for that, and anxiety (Buspar). She has had a SW visit and there are no barriers to d/c. She is breastfeeding.  Physical Exam:  General: alert, cooperative and mild distress Heart: RRR Lungs: nl effort Lochia: appropriate Uterine Fundus: firm Incision: pressure dsg intact; no staining DVT Evaluation: No evidence of DVT seen on physical exam.  Discharge Diagnoses: Term Pregnancy-delivered  Discharge Information: Date: 01/24/2014 Activity: pelvic rest Diet: routine Medications: PNV, Ibuprofen, Percocet and Buspar (continue home dose) Condition: stable Instructions: refer to practice specific booklet Discharge to: home Follow-up Information   [facility] up with [facility] In 4 weeks. (For your postpartum appointment.)    Specialty:  Obstetrics and Gynecology   Contact information:   [address] [place]  [id number] [phone]      Newborn Data: Live born female  Birth Weight: 8 lb 8.5 oz (3870 g) APGAR: 7, 9  Home with mother.  Joan Heath 01/24/2014, 7:22 AM

## 2014-01-24 NOTE — Discharge Instructions (Signed)
Cesarean Delivery Care After Refer to this sheet in the next few weeks. These instructions provide you with information on caring for yourself after your procedure. Your health care provider may also give you specific instructions. Your treatment has been planned according to current medical practices, but problems sometimes occur. Call your health care provider if you have any problems or questions after you go home. HOME CARE INSTRUCTIONS  Only take over-the-counter or prescription medications as directed by your health care provider.  Do not drink alcohol, especially if you are breastfeeding or taking medication to relieve pain.  Do not chew or smoke tobacco.  Continue to use good perineal care. Good perineal care includes:  Wiping your perineum from front to back.  Keeping your perineum clean.  Check your surgical cut (incision) daily for increased redness, drainage, swelling, or separation of skin.  Clean your incision gently with soap and water every day, and then pat it dry. If your health care provider says it is OK, leave the incision uncovered. Use a bandage (dressing) if the incision is draining fluid or appears irritated. If the adhesive strips across the incision do not fall off within 7 days, carefully peel them off.  Hug a pillow when coughing or sneezing until your incision is healed. This helps to relieve pain.  Do not use tampons or douche until your health care provider says it is okay.  Shower, wash your hair, and take tub baths as directed by your health care provider.  Wear a well-fitting bra that provides breast support.  Limit wearing support panties or control-top hose.  Drink enough fluids to keep your urine clear or pale yellow.  Eat high-fiber foods such as whole grain cereals and breads, brown rice, beans, and fresh fruits and vegetables every day. These foods may help prevent or relieve constipation.  Resume activities such as climbing stairs,  driving, lifting, exercising, or traveling as directed by your health care provider.  Talk to your health care provider about resuming sexual activities. This is dependent upon your risk of infection, your rate of healing, and your comfort and desire to resume sexual activity.  Try to have someone help you with your household activities and your newborn for at least a few days after you leave the hospital.  Rest as much as possible. Try to rest or take a nap when your newborn is sleeping.  Increase your activities gradually.  Keep all of your scheduled postpartum appointments. It is very important to keep your scheduled follow-up appointments. At these appointments, your health care provider will be checking to make sure that you are healing physically and emotionally. SEEK MEDICAL CARE IF:   You are passing large clots from your vagina. Save any clots to show your health care provider.  You have a foul smelling discharge from your vagina.  You have trouble urinating.  You are urinating frequently.  You have pain when you urinate.  You have a change in your bowel movements.  You have increasing redness, pain, or swelling near your incision.  You have pus draining from your incision.  Your incision is separating.  You have painful, hard, or reddened breasts.  You have a severe headache.  You have blurred vision or see spots.  You feel sad or depressed.  You have thoughts of hurting yourself or your newborn.  You have questions about your care, the care of your newborn, or medications.  You are dizzy or lightheaded.  You have a rash.  You  have pain, redness, or swelling at the site of the removed intravenous access (IV) tube. °· You have nausea or vomiting. °· You stopped breastfeeding and have not had a menstrual period within 12 weeks of stopping. °· You are not breastfeeding and have not had a menstrual period within 12 weeks of delivery. °· You have a fever. °SEEK  IMMEDIATE MEDICAL CARE IF: °· You have persistent pain. °· You have chest pain. °· You have shortness of breath. °· You faint. °· You have leg pain. °· You have stomach pain. °· Your vaginal bleeding saturates 2 or more sanitary pads in 1 hour. °MAKE SURE YOU:  °· Understand these instructions. °· Will watch your condition. °· Will get help right away if you are not doing well or get worse. °Document Released: 06/23/2002 Document Revised: 06/03/2013 Document Reviewed: 05/28/2012 °[employer]® Patient Information ©2014 [employer]. ° ° ° °

## 2014-01-24 NOTE — Progress Notes (Signed)
Patient set up with DEBP.

## 2014-01-25 ENCOUNTER — Ambulatory Visit: Payer: Self-pay

## 2014-01-25 ENCOUNTER — Encounter (HOSPITAL_COMMUNITY): Payer: Self-pay | Admitting: Family Medicine

## 2014-01-25 NOTE — Lactation Note (Signed)
This note was copied from the chart of [name]. Lactation Consultation Note Follow up consult:  Mother has short shaft nipples.   Assisted mother in placing baby in football hold.  Baby fussy and would not sustain latch. Prefilled #20 NS with formula & syringe. Baby would suck and then come off and cry.  Some rhythmical sucking observed. BF attempt for 10 min with off and on crying.  Checks diaper and baby has stool. Reviewed with parents to check diaper first before feeding and burp baby often. Mother will post pump after daytime feeding for 15 min. both breasts. Mother states she has very little breastmilk.  Reviewed supply and demand and engorgement care. Encouraged mother to call with next pumping session for assistance.   Reviewed massaging and hand expressiong before pumping.  Mother has her own DEBP at home.  Reviewed appt sheet for 4/21 2:30 pm.    Patient Name: [name] [id number] Date: 01/25/2014 Reason for consult: Follow-up assessment   Maternal Data Has patient been taught Hand Expression?: Yes  Feeding Feeding Type: Breast Fed  LATCH Score/Interventions Latch: Repeated attempts needed to sustain latch, nipple held in mouth throughout feeding, stimulation needed to elicit sucking reflex. Intervention(s): Skin to skin Intervention(s): Assist with latch;Adjust position;Breast massage;Breast compression  Audible Swallowing: A few with stimulation Intervention(s): Skin to skin  Type of Nipple: Flat (semi flat) Intervention(s): Shells;Hand pump;Double electric pump  Comfort (Breast/Nipple): Soft / non-tender     Hold (Positioning): Assistance needed to correctly position infant at breast and maintain latch. Intervention(s): Breastfeeding basics reviewed;Support Pillows;Position options;Skin to skin  LATCH Score: 6  Lactation Tools Discussed/Used Tools: Shells;Nipple Shields;Pump;Comfort gels Nipple shield size: 20 Shell Type: Inverted Breast  pump type: Double-Electric Breast Pump Pump Review: Setup, frequency, and cleaning   Consult Status Consult Status: Follow-up Date: 02/02/14 Follow-up type: Out-patient    Larry Sierras Berkelhammer 01/25/2014, 12:04 PM

## 2014-01-25 NOTE — Lactation Note (Signed)
This note was copied from the chart of Joan Heath. Lactation Consultation Note Follow up consult:  Mother states when she pumps she gets minimal amount of breastmilk and states she is pumping every 3 hours. She is using NS and supplementing with formula.  Made mother an outpatient appointment for 4/21 at 2:30. Left [facility] phone number and asked mother to call to view latch and assist with pumping. Mother states she has her own DEBP at home.  Reviewed how smoking can lower her milk supply.  Mother understood. Reviewed breast massage, hand expression before pumping and engorgement care.  Patient Name: Joan Heath [id number] Date: 01/25/2014 Reason for consult: Follow-up assessment   Maternal Data    Feeding Feeding Type: Formula  LATCH Score/Interventions                      Lactation Tools Discussed/Used     Consult Status Consult Status: PRN    Carlye Grippe 01/25/2014, 9:33 AM

## 2014-01-25 NOTE — Progress Notes (Signed)
Post discharge ur review completed. 

## 2014-02-27 ENCOUNTER — Encounter: Payer: Self-pay | Admitting: *Deleted

## 2014-03-01 ENCOUNTER — Encounter: Payer: Self-pay | Admitting: Obstetrics & Gynecology

## 2014-03-01 ENCOUNTER — Ambulatory Visit (INDEPENDENT_AMBULATORY_CARE_PROVIDER_SITE_OTHER): Payer: Medicaid Other | Admitting: Obstetrics & Gynecology

## 2014-03-01 VITALS — BP 102/72 | HR 64 | Ht 66.0 in | Wt 218.0 lb

## 2014-03-01 DIAGNOSIS — IMO0002 Reserved for concepts with insufficient information to code with codable children: Secondary | ICD-10-CM

## 2014-03-01 DIAGNOSIS — T8189XA Other complications of procedures, not elsewhere classified, initial encounter: Secondary | ICD-10-CM

## 2014-03-01 DIAGNOSIS — B372 Candidiasis of skin and nail: Secondary | ICD-10-CM

## 2014-03-01 MED ORDER — FLUCONAZOLE 150 MG PO TABS: 150.0000 mg | ORAL_TABLET | Freq: Once | ORAL | Status: DC

## 2014-03-01 NOTE — Progress Notes (Signed)
   Subjective:    Patient ID: Joan Heath, female    DOB: 1986-03-10, 28 y.o.   MRN: [id number]  HPI  28 yo P2 now 5 weeks post op c/s is here because she is worried that her incision is infected. It is red in the midline. She denies fevers or other complaints.  Review of Systems     Objective:   Physical Exam  Midline 4 cm of incision with yeast dermatitis. She has a large panus that is causing the incision to be very moist.      Assessment & Plan:  Yeast dermatitis at incision site beneath panus. I have given her a prescription for diflucan and have stressed keeping the skin from rubbing on skin. I have given her some gauze and demonstrated how to keep the area drier. RTC 1 week for pp visit.

## 2014-03-01 NOTE — Progress Notes (Signed)
Feels like incision might be getting infected and would like for it to be checked.  Having increased discomfort and redness in the middle of the incision line.

## 2014-03-03 IMAGING — US US OB FOLLOW-UP
1 series · 12 of 28 positions shown · non-contrast
Comparison: none

[Series 1: us ob follow-up · 12 of 61 slices shown]
[im 3/61]
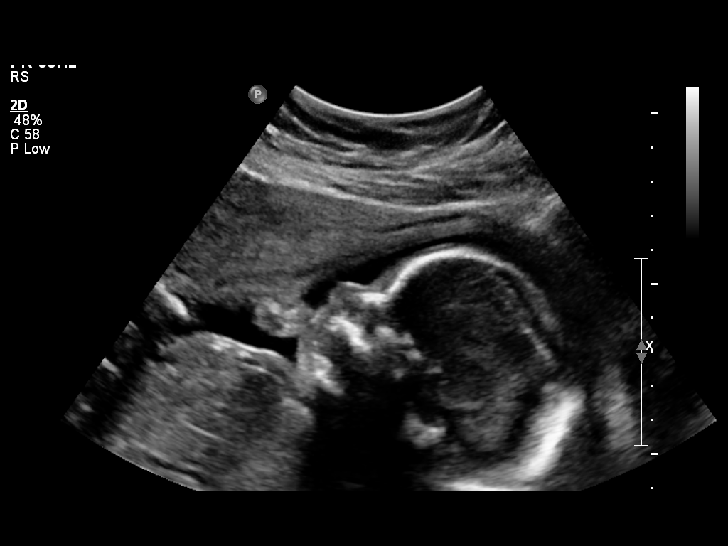
[im 7/61]
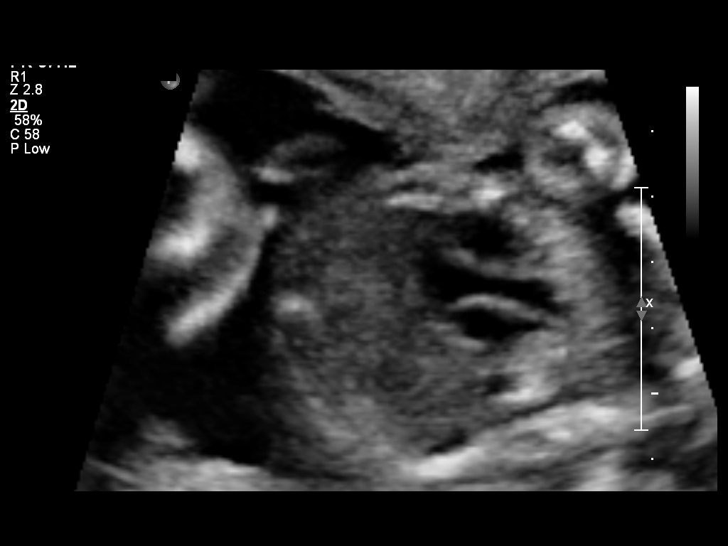
[im 12/61]
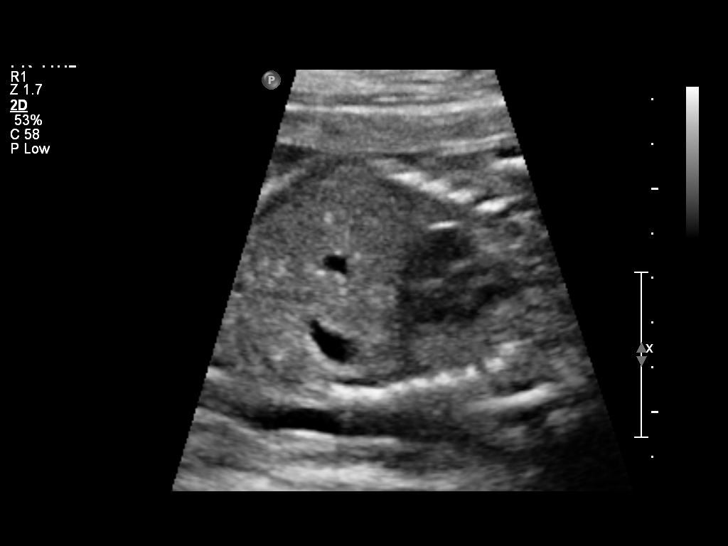
[im 18/61]
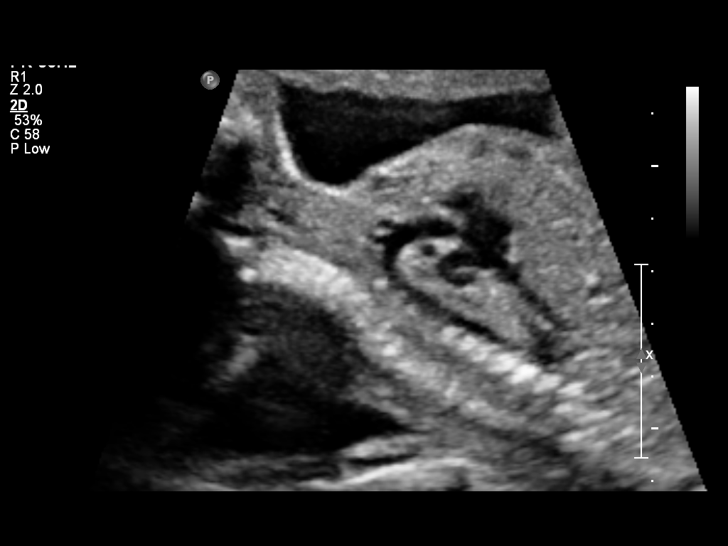
[im 23/61]
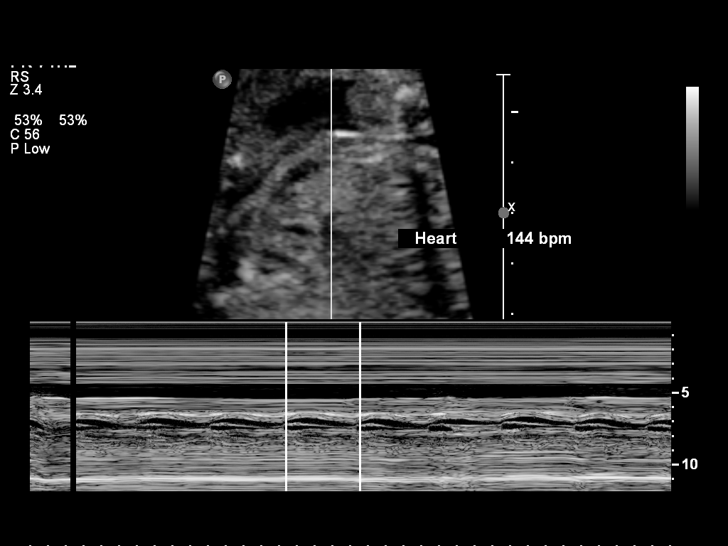
[im 27/61]
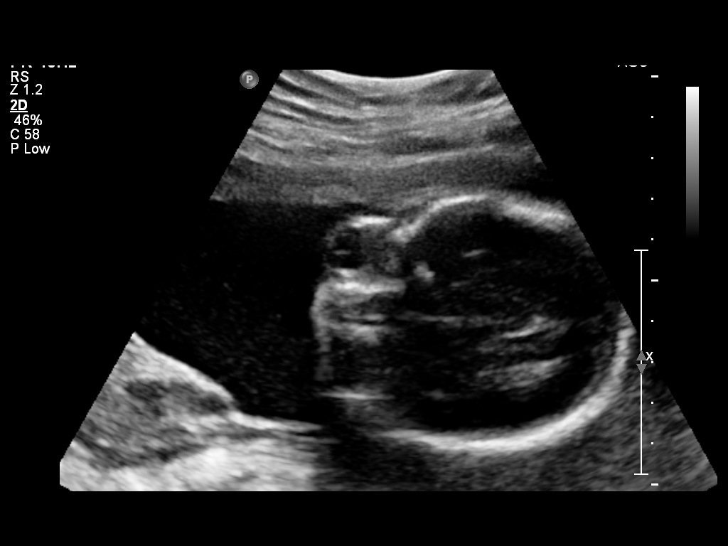
[im 34/61]
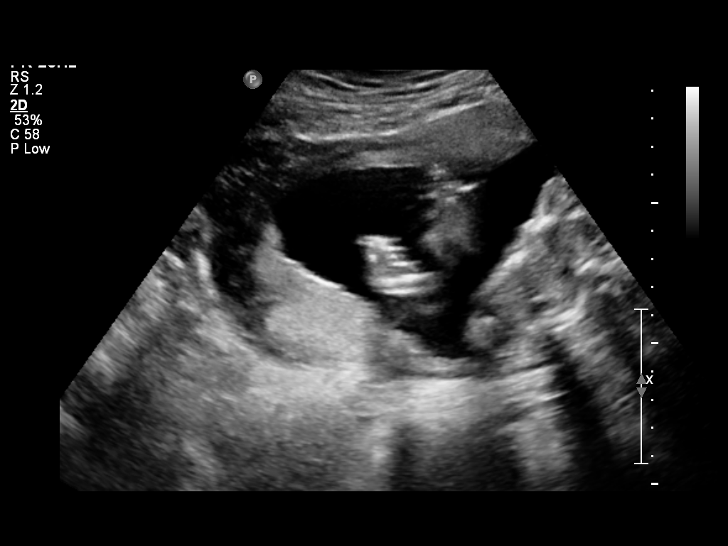
[im 38/61]
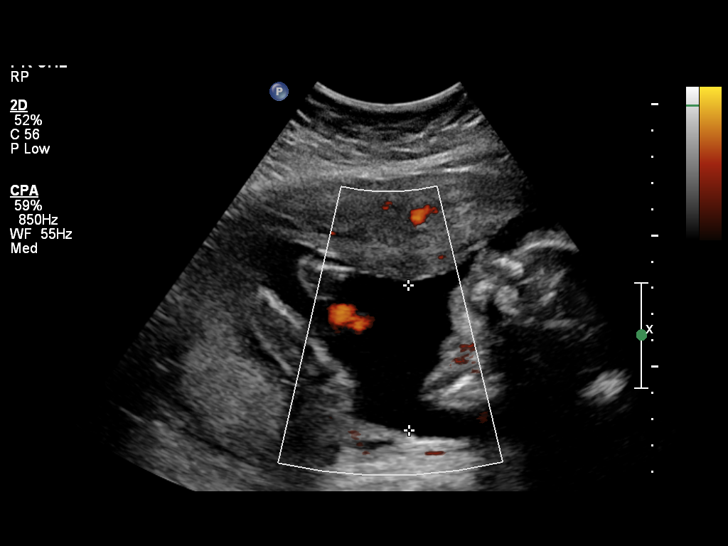
[im 43/61]
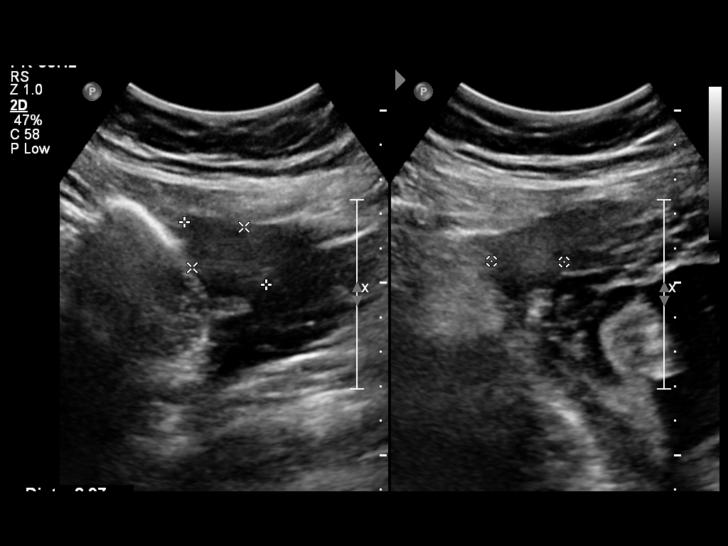
[im 49/61]
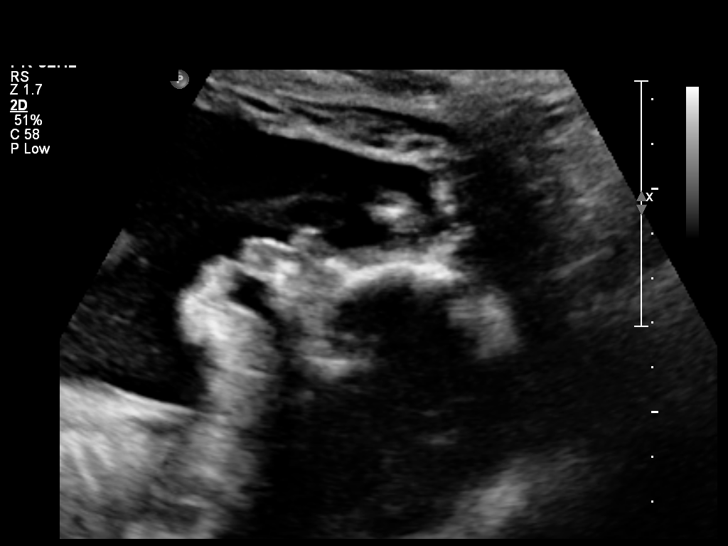
[im 54/61]
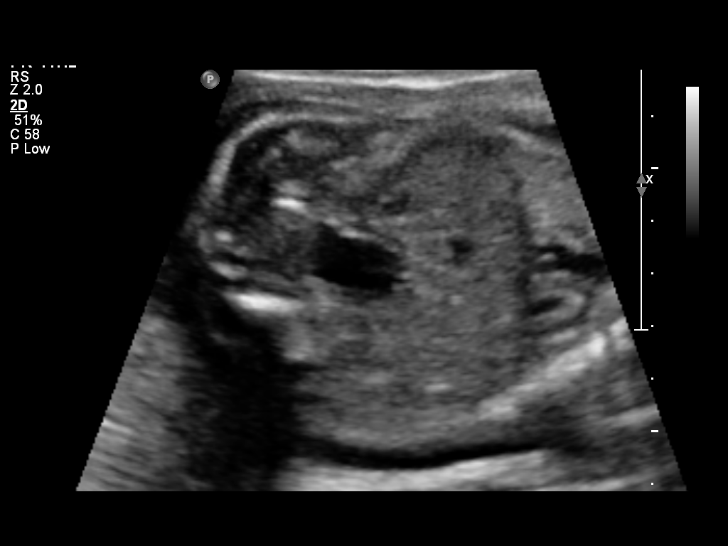
[im 58/61]
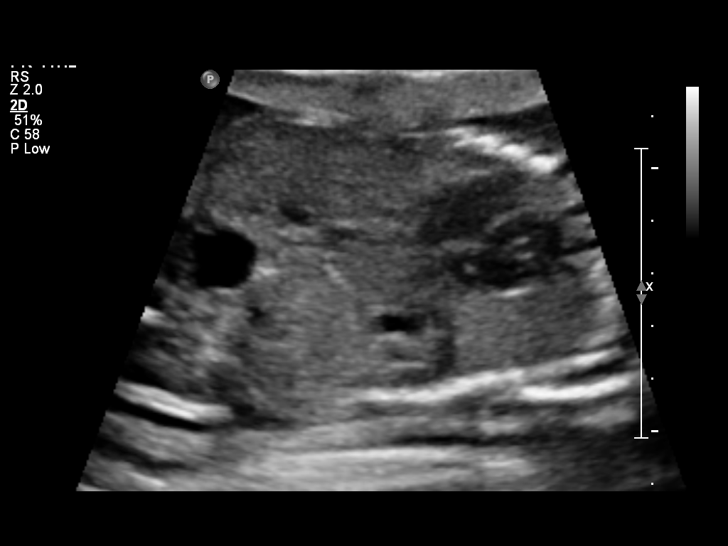

[12 of 28 positions shown; findings below may reference images not displayed]

OBSTETRICS REPORT
                      (Signed Final 09/28/2013 [DATE])

Service(s) Provided

 US OB FOLLOW UP                                       76816.1
Indications

 Follow-up incomplete fetal anatomic evaluation
 History of cesarean delivery, currently pregnant      654.20,
 Cigarette smoker
Fetal Evaluation

 Num Of Fetuses:    1
 Fetal Heart Rate:  144                          bpm
 Cardiac Activity:  Observed
 Presentation:      Cephalic
 Placenta:          Posterior, above cervical
                    os
 P. Cord            Marginal insertion
 Insertion:

 Amniotic Fluid
 AFI FV:      Subjectively within normal limits
                                             Larg Pckt:     5.5  cm
Biometry

 BPD:     60.3  mm     G. Age:  24w 4d                CI:        73.94   70 - 86
                                                      FL/HC:      19.7   18.7 -

 HC:     222.7  mm     G. Age:  24w 2d       22  %    HC/AC:      1.13   1.04 -

 AC:     196.6  mm     G. Age:  24w 3d       34  %    FL/BPD:     72.6   71 - 87
 FL:      43.8  mm     G. Age:  24w 3d       31  %    FL/AC:      22.3   20 - 24
 HUM:     38.5  mm     G. Age:  23w 5d       20  %

 Est. FW:     688  gm      1 lb 8 oz     48  %
Gestational Age

 U/S Today:     24w 3d                                        EDD:   01/15/14
 Best:          24w 4d     Det. By:  U/S (07/20/13)           EDD:   01/14/14
Anatomy

 Cranium:          Appears normal         Aortic Arch:      Appears normal
 Fetal Cavum:      Appears normal         Ductal Arch:      Appears normal
 Ventricles:       Appears normal         Diaphragm:        Appears normal
 Choroid Plexus:   Previously seen        Stomach:          Appears normal, left
                                                            sided
 Cerebellum:       Previously seen        Abdomen:          Appears normal
 Posterior Fossa:  Previously seen        Abdominal Wall:   Appears nml (cord
                                                            insert, abd wall)
 Nuchal Fold:      Previously seen        Cord Vessels:     Appears normal (3
                                                            vessel cord)
 Face:             Appears normal         Kidneys:          Appear normal
                   (orbits and profile)
 Lips:             Appears normal         Bladder:          Appears normal
 Heart:            Appears normal         Spine:            Previously seen
                   (4CH, axis, and
                   situs)
 RVOT:             Appears normal         Lower             Previously seen
                                          Extremities:
 LVOT:             Appears normal         Upper             Previously seen
                                          Extremities:

 Other:  Fetus appears to be a female. Heels and 5th digit visualized.
Cervix Uterus Adnexa

 Cervical Length:    4.2      cm

 Cervix:       Normal appearance by transabdominal scan.
 Uterus:       No abnormality visualized.
 Cul De Sac:   No free fluid seen.
 Left Ovary:    Within normal limits.
 Right Ovary:   Within normal limits.
 Adnexa:     No abnormality visualized.
Impression

 IUP at 28+4 weeks
 Normal interval anatomy; anatomic survey complete
 Normal amniotic fluid volume
 Appropriate interval growth with EFW at the 48th %tile
Recommendations

 Follow-up as clinically indicated

 questions or concerns.

## 2014-03-09 ENCOUNTER — Ambulatory Visit (INDEPENDENT_AMBULATORY_CARE_PROVIDER_SITE_OTHER): Payer: Medicaid Other | Admitting: Family Medicine

## 2014-03-09 ENCOUNTER — Encounter: Payer: Self-pay | Admitting: Family Medicine

## 2014-03-09 NOTE — Progress Notes (Signed)
  Subjective:     Joan Heath is a 28 y.o. female who presents for a postpartum visit. She is 6 weeks postpartum following a low cervical transverse Cesarean section. I have fully reviewed the prenatal and intrapartum course. The delivery was at 70 gestational weeks. Outcome: repeat cesarean section, low transverse incision. Anesthesia: spinal. Postpartum course has been unremarkable. Baby's course has been notable for colic. Baby is feeding by bottle - gerber. Bleeding no bleeding. Bowel function is normal. Bladder function is normal. Patient is sexually active. Contraception method is tubal ligation. Postpartum depression screening: negative-borderline at 10--has psychiatrist--will refer to new one.  The following portions of the patient's history were reviewed and updated as appropriate: allergies, current medications, past family history, past medical history, past social history, past surgical history and problem list.  Review of Systems Pertinent items are noted in HPI.   Objective:    BP 94/66  Pulse 57  Ht 5\' 6"  (1.676 m)  Wt 218 lb (98.884 kg)  BMI 35.20 kg/m2  Breastfeeding? No  General:  alert, cooperative and appears stated age  Abdomen: soft, non-tender; bowel sounds normal; no masses,  no organomegaly and incision is well-healed   Vulva:  normal  Vagina: normal vagina  Cervix:  no cervical motion tenderness and no lesions  Corpus: normal size, contour, position, consistency, mobility, non-tender  Adnexa:  normal adnexa        Assessment:     Normal postpartum exam. Bipolar disease. Pap smear not done at today's visit.   Plan:    1. Contraception: tubal ligation 2. Pap not due until 2017. 3. Follow up in: 5 months and as needed.  4. Referral to psychiatry

## 2014-03-09 NOTE — Patient Instructions (Signed)
Bipolar Disorder Bipolar disorder is a mental illness. The term bipolar disorder actually is used to describe a group of disorders that all share varying degrees of emotional highs and lows that can interfere with daily functioning, such as work, school, or relationships. Bipolar disorder also can lead to drug abuse, hospitalization, and suicide. The emotional highs of bipolar disorder are periods of elation or irritability and high energy. These highs can range from a mild form (hypomania) to a severe form (mania). People experiencing episodes of hypomania may appear energetic, excitable, and highly productive. People experiencing mania may behave impulsively or erratically. They often make poor decisions. They may have difficulty sleeping. The most severe episodes of mania can involve having very distorted beliefs or perceptions about the world and seeing or hearing things that are not real (psychotic delusions and hallucinations).  The emotional lows of bipolar disorder (depression) also can range from mild to severe. Severe episodes of bipolar depression can involve psychotic delusions and hallucinations. Sometimes people with bipolar disorder experience a state of mixed mood. Symptoms of hypomania or mania and depression are both present during this mixed-mood episode. SIGNS AND SYMPTOMS There are signs and symptoms of the episodes of hypomania and mania as well as the episodes of depression. The signs and symptoms of hypomania and mania are similar but vary in severity. They include:  Inflated self-esteem or feeling of increased self-confidence.  Decreased need for sleep.  Unusual talkativeness (rapid or pressured speech) or the feeling of a need to keep talking.  Sensation of racing thoughts or constant talking, with quick shifts between topics that may or may not be related (flight of ideas).  Decreased ability to focus or concentrate.  Increased purposeful activity, such as work, studies,  or social activity, or nonproductive activity, such as pacing, squirming and fidgeting, or finger and toe tapping.  Impulsive behavior and use of poor judgment, resulting in high-risk activities, such as having unprotected sex or spending excessive amounts of money. Signs and symptoms of depression include the following:   Feelings of sadness, hopelessness, or helplessness.  Frequent or uncontrollable episodes of crying.  Lack of feeling anything or caring about anything.  Difficulty sleeping or sleeping too much.  Inability to enjoy the things you used to enjoy.   Desire to be alone all the time.   Feelings of guilt or worthlessness.  Lack of energy or motivation.   Difficulty concentrating, remembering, or making decisions.  Change in appetite or weight beyond normal fluctuations.  Thoughts of death or the desire to harm yourself. DIAGNOSIS  Bipolar disorder is diagnosed through an assessment by your caregiver. Your caregiver will ask questions about your emotional episodes. There are two main types of bipolar disorder. People with type I bipolar disorder have manic episodes with or without depressive episodes. People with type II bipolar disorder have hypomanic episodes and major depressive episodes, which are more serious than mild depression. The type of bipolar disorder you have can make an important difference in how your illness is monitored and treated. Your caregiver may ask questions about your medical history and use of alcohol or drugs, including prescription medication. Certain medical conditions and substances also can cause emotional highs and lows that resemble bipolar disorder (secondary bipolar disorder).  TREATMENT  Bipolar disorder is a long-term illness. It is best controlled with continuous treatment rather than treatment only when symptoms occur. The following treatments can be prescribed for bipolar disorders:  Medication Medication can be prescribed by  a doctor  that is an expert in treating mental disorders (psychiatrists). Medications called mood stabilizers are usually prescribed to help control the illness. Other medications are sometimes added if symptoms of mania, depression, or psychotic delusions and hallucinations occur despite the use of a mood stabilizer.  Talk therapy Some forms of talk therapy are helpful in providing support, education, and guidance. A combination of medication and talk therapy is best for managing the disorder over time. A procedure in which electricity is applied to your brain through your scalp (electroconvulsive therapy) is used in cases of severe mania when medication and talk therapy do not work or work too slowly. Document Released: 01/07/2001 Document Revised: 01/26/2013 Document Reviewed: 10/27/2012 [facility] Patient Information 2014 [place], [place]. Postpartum Depression and Baby Blues The postpartum period begins right after the birth of a baby. During this time, there is often a great amount of joy and excitement. It is also a time of considerable changes in the life of the parent(s). Regardless of how many times a mother gives birth, each child brings new challenges and dynamics to the family. It is not unusual to have feelings of excitement accompanied by confusing shifts in moods, emotions, and thoughts. All mothers are at risk of developing postpartum depression or the "baby blues." These mood changes can occur right after giving birth, or they may occur many months after giving birth. The baby blues or postpartum depression can be mild or severe. Additionally, postpartum depression can resolve rather quickly, or it can be a long-term condition. CAUSES Elevated hormones and their rapid decline are thought to be a main cause of postpartum depression and the baby blues. There are a number of hormones that radically change during and after pregnancy. Estrogen and progesterone usually decrease immediately after  delivering your baby. The level of thyroid hormone and various cortisol steroids also rapidly drop. Other factors that play a major role in these changes include major life events and genetics.  RISK FACTORS If you have any of the following risks for the baby blues or postpartum depression, know what symptoms to watch out for during the postpartum period. Risk factors that may increase the likelihood of getting the baby blues or postpartum depression include:  Havinga personal or family history of depression.  Having depression while being pregnant.  Having premenstrual or oral contraceptive-associated mood issues.  Having exceptional life stress.  Having marital conflict.  Lacking a social support network.  Having a baby with special needs.  Having health problems such as diabetes. SYMPTOMS Baby blues symptoms include:  Brief fluctuations in mood, such as going from extreme happiness to sadness.  Decreased concentration.  Difficulty sleeping.  Crying spells, tearfulness.  Irritability.  Anxiety. Postpartum depression symptoms typically begin within the first month after giving birth. These symptoms include:  Difficulty sleeping or excessive sleepiness.  Marked weight loss.  Agitation.  Feelings of worthlessness.  Lack of interest in activity or food. Postpartum psychosis is a very concerning condition and can be dangerous. Fortunately, it is rare. Displaying any of the following symptoms is cause for immediate medical attention. Postpartum psychosis symptoms include:  Hallucinations and delusions.  Bizarre or disorganized behavior.  Confusion or disorientation. DIAGNOSIS  A diagnosis is made by an evaluation of your symptoms. There are no medical or lab tests that lead to a diagnosis, but there are various questionnaires that a caregiver may use to identify those with the baby blues, postpartum depression, or psychosis. Often times, a screening tool called the  [place] Postnatal Depression  Scale is used to diagnose depression in the postpartum period.  TREATMENT The baby blues usually goes away on its own in 1 to 2 weeks. Social support is often all that is needed. You should be encouraged to get adequate sleep and rest. Occasionally, you may be given medicines to help you sleep.  Postpartum depression requires treatment as it can last several months or longer if it is not treated. Treatment may include individual or group therapy, medicine, or both to address any social, physiological, and psychological factors that may play a role in the depression. Regular exercise, a healthy diet, rest, and social support may also be strongly recommended.  Postpartum psychosis is more serious and needs treatment right away. Hospitalization is often needed. HOME CARE INSTRUCTIONS  Get as much rest as you can. Nap when the baby sleeps.  Exercise regularly. Some women find yoga and walking to be beneficial.  Eat a balanced and nourishing diet.  Do little things that you enjoy. Have a cup of tea, take a bubble bath, read your favorite magazine, or listen to your favorite music.  Avoid alcohol.  Ask for help with household chores, cooking, grocery shopping, or running errands as needed. Do not try to do everything.  Talk to people close to you about how you are feeling. Get support from your partner, family members, friends, or other new moms.  Try to stay positive in how you think. Think about the things you are grateful for.  Do not spend a lot of time alone.  Only take medicine as directed by your caregiver.  Keep all your postpartum appointments.  Let your caregiver know if you have any concerns. SEEK MEDICAL CARE IF: You are having a reaction or problems with your medicine. SEEK IMMEDIATE MEDICAL CARE IF:  You have suicidal feelings.  You feel you may harm the baby or someone else. Document Released: 07/05/2004 Document Revised: 12/24/2011  Document Reviewed: 07/13/2013 [facility] Patient Information 2014 [place], [place].

## 2014-08-16 ENCOUNTER — Encounter: Payer: Self-pay | Admitting: Family Medicine

## 2014-10-15 HISTORY — PX: BREAST BIOPSY: SHX20

## 2015-01-12 ENCOUNTER — Encounter (HOSPITAL_COMMUNITY): Payer: Self-pay

## 2015-01-12 ENCOUNTER — Encounter: Payer: Self-pay | Admitting: Obstetrics & Gynecology

## 2015-01-12 ENCOUNTER — Other Ambulatory Visit (HOSPITAL_COMMUNITY)
Admission: RE | Admit: 2015-01-12 | Discharge: 2015-01-12 | Disposition: A | Discharge status: Home / Self Care | Payer: Medicaid Other | Source: Ambulatory Visit | Attending: Obstetrics & Gynecology | Admitting: Obstetrics & Gynecology

## 2015-01-12 ENCOUNTER — Ambulatory Visit (INDEPENDENT_AMBULATORY_CARE_PROVIDER_SITE_OTHER): Payer: Medicaid Other | Admitting: Obstetrics & Gynecology

## 2015-01-12 VITALS — BP 126/81 | HR 81 | Ht 66.0 in | Wt 224.0 lb

## 2015-01-12 DIAGNOSIS — A749 Chlamydial infection, unspecified: Secondary | ICD-10-CM | POA: Diagnosis not present

## 2015-01-12 DIAGNOSIS — Z01419 Encounter for gynecological examination (general) (routine) without abnormal findings: Secondary | ICD-10-CM | POA: Diagnosis present

## 2015-01-12 DIAGNOSIS — Z113 Encounter for screening for infections with a predominantly sexual mode of transmission: Secondary | ICD-10-CM | POA: Diagnosis not present

## 2015-01-12 DIAGNOSIS — Z803 Family history of malignant neoplasm of breast: Secondary | ICD-10-CM

## 2015-01-12 DIAGNOSIS — N920 Excessive and frequent menstruation with regular cycle: Secondary | ICD-10-CM | POA: Diagnosis not present

## 2015-01-12 DIAGNOSIS — Z124 Encounter for screening for malignant neoplasm of cervix: Secondary | ICD-10-CM | POA: Diagnosis not present

## 2015-01-12 HISTORY — DX: Chlamydial infection, unspecified: A74.9

## 2015-01-12 MED ORDER — IBUPROFEN 800 MG PO TABS: 800.0000 mg | ORAL_TABLET | Freq: Three times a day (TID) | ORAL | Status: DC | PRN

## 2015-01-12 MED ORDER — TRANEXAMIC ACID 650 MG PO TABS: 1300.0000 mg | ORAL_TABLET | Freq: Three times a day (TID) | ORAL | Status: DC

## 2015-01-12 NOTE — Patient Instructions (Signed)
Preventive Care for Adults A healthy lifestyle and preventive care can promote health and wellness. Preventive health guidelines for women include the following key practices.  A routine yearly physical is a good way to check with your health care provider about your health and preventive screening. It is a chance to share any concerns and updates on your health and to receive a thorough exam.  Visit your dentist for a routine exam and preventive care every 6 months. Brush your teeth twice a day and floss once a day. Good oral hygiene prevents tooth decay and gum disease.  The frequency of eye exams is based on your age, health, family medical history, use of contact lenses, and other factors. Follow your health care provider's recommendations for frequency of eye exams.  Eat a healthy diet. Foods like vegetables, fruits, whole grains, low-fat dairy products, and lean protein foods contain the nutrients you need without too many calories. Decrease your intake of foods high in solid fats, added sugars, and salt. Eat the right amount of calories for you.Get information about a proper diet from your health care provider, if necessary.  Regular physical exercise is one of the most important things you can do for your health. Most adults should get at least 150 minutes of moderate-intensity exercise (any activity that increases your heart rate and causes you to sweat) each week. In addition, most adults need muscle-strengthening exercises on 2 or more days a week.  Maintain a healthy weight. The body mass index (BMI) is a screening tool to identify possible weight problems. It provides an estimate of body fat based on height and weight. Your health care provider can find your BMI and can help you achieve or maintain a healthy weight.For adults 20 years and older:  A BMI below 18.5 is considered underweight.  A BMI of 18.5 to 24.9 is normal.  A BMI of 25 to 29.9 is considered overweight.  A BMI of  30 and above is considered obese.  Maintain normal blood lipids and cholesterol levels by exercising and minimizing your intake of saturated fat. Eat a balanced diet with plenty of fruit and vegetables. Blood tests for lipids and cholesterol should begin at age 76 and be repeated every 5 years. If your lipid or cholesterol levels are high, you are over 50, or you are at high risk for heart disease, you may need your cholesterol levels checked more frequently.Ongoing high lipid and cholesterol levels should be treated with medicines if diet and exercise are not working.  If you smoke, find out from your health care provider how to quit. If you do not use tobacco, do not start.  Lung cancer screening is recommended for adults aged 22-80 years who are at high risk for developing lung cancer because of a history of smoking. A yearly low-dose CT scan of the lungs is recommended for people who have at least a 30-pack-year history of smoking and are a current smoker or have quit within the past 15 years. A pack year of smoking is smoking an average of 1 pack of cigarettes a day for 1 year (for example: 1 pack a day for 30 years or 2 packs a day for 15 years). Yearly screening should continue until the smoker has stopped smoking for at least 15 years. Yearly screening should be stopped for people who develop a health problem that would prevent them from having lung cancer treatment.  If you are pregnant, do not drink alcohol. If you are breastfeeding,  be very cautious about drinking alcohol. If you are not pregnant and choose to drink alcohol, do not have more than 1 drink per day. One drink is considered to be 12 ounces (355 mL) of beer, 5 ounces (148 mL) of wine, or 1.5 ounces (44 mL) of liquor.  Avoid use of street drugs. Do not share needles with anyone. Ask for help if you need support or instructions about stopping the use of drugs.  High blood pressure causes heart disease and increases the risk of  stroke. Your blood pressure should be checked at least every 1 to 2 years. Ongoing high blood pressure should be treated with medicines if weight loss and exercise do not work.  If you are 45-77 years old, ask your health care provider if you should take aspirin to prevent strokes.  Diabetes screening involves taking a blood sample to check your fasting blood sugar level. This should be done once every 3 years, after age 64, if you are within normal weight and without risk factors for diabetes. Testing should be considered at a younger age or be carried out more frequently if you are overweight and have at least 1 risk factor for diabetes.  Breast cancer screening is essential preventive care for women. You should practice "breast self-awareness." This means understanding the normal appearance and feel of your breasts and may include breast self-examination. Any changes detected, no matter how small, should be reported to a health care provider. Women in their 64s and 30s should have a clinical breast exam (CBE) by a health care provider as part of a regular health exam every 1 to 3 years. After age 36, women should have a CBE every year. Starting at age 46, women should consider having a mammogram (breast X-ray test) every year. Women who have a family history of breast cancer should talk to their health care provider about genetic screening. Women at a high risk of breast cancer should talk to their health care providers about having an MRI and a mammogram every year.  Breast cancer gene (BRCA)-related cancer risk assessment is recommended for women who have family members with BRCA-related cancers. BRCA-related cancers include breast, ovarian, tubal, and peritoneal cancers. Having family members with these cancers may be associated with an increased risk for harmful changes (mutations) in the breast cancer genes BRCA1 and BRCA2. Results of the assessment will determine the need for genetic counseling and  BRCA1 and BRCA2 testing.  Routine pelvic exams to screen for cancer are no longer recommended for nonpregnant women who are considered low risk for cancer of the pelvic organs (ovaries, uterus, and vagina) and who do not have symptoms. Ask your health care provider if a screening pelvic exam is right for you.  If you have had past treatment for cervical cancer or a condition that could lead to cancer, you need Pap tests and screening for cancer for at least 20 years after your treatment. If Pap tests have been discontinued, your risk factors (such as having a new sexual partner) need to be reassessed to determine if screening should be resumed. Some women have medical problems that increase the chance of getting cervical cancer. In these cases, your health care provider may recommend more frequent screening and Pap tests.  The HPV test is an additional test that may be used for cervical cancer screening. The HPV test looks for the virus that can cause the cell changes on the cervix. The cells collected during the Pap test can be  tested for HPV. The HPV test could be used to screen women aged 30 years and older, and should be used in women of any age who have unclear Pap test results. After the age of 30, women should have HPV testing at the same frequency as a Pap test.  Colorectal cancer can be detected and often prevented. Most routine colorectal cancer screening begins at the age of 50 years and continues through age 75 years. However, your health care provider may recommend screening at an earlier age if you have risk factors for colon cancer. On a yearly basis, your health care provider may provide home test kits to check for hidden blood in the stool. Use of a small camera at the end of a tube, to directly examine the colon (sigmoidoscopy or colonoscopy), can detect the earliest forms of colorectal cancer. Talk to your health care provider about this at age 50, when routine screening begins. Direct  exam of the colon should be repeated every 5-10 years through age 75 years, unless early forms of pre-cancerous polyps or small growths are found.  People who are at an increased risk for hepatitis B should be screened for this virus. You are considered at high risk for hepatitis B if:  You were born in a country where hepatitis B occurs often. Talk with your health care provider about which countries are considered high risk.  Your parents were born in a high-risk country and you have not received a shot to protect against hepatitis B (hepatitis B vaccine).  You have HIV or AIDS.  You use needles to inject street drugs.  You live with, or have sex with, someone who has hepatitis B.  You get hemodialysis treatment.  You take certain medicines for conditions like cancer, organ transplantation, and autoimmune conditions.  Hepatitis C blood testing is recommended for all people born from 1945 through 1965 and any individual with known risks for hepatitis C.  Practice safe sex. Use condoms and avoid high-risk sexual practices to reduce the spread of sexually transmitted infections (STIs). STIs include gonorrhea, chlamydia, syphilis, trichomonas, herpes, HPV, and human immunodeficiency virus (HIV). Herpes, HIV, and HPV are viral illnesses that have no cure. They can result in disability, cancer, and death.  You should be screened for sexually transmitted illnesses (STIs) including gonorrhea and chlamydia if:  You are sexually active and are younger than 24 years.  You are older than 24 years and your health care provider tells you that you are at risk for this type of infection.  Your sexual activity has changed since you were last screened and you are at an increased risk for chlamydia or gonorrhea. Ask your health care provider if you are at risk.  If you are at risk of being infected with HIV, it is recommended that you take a prescription medicine daily to prevent HIV infection. This is  called preexposure prophylaxis (PrEP). You are considered at risk if:  You are a heterosexual woman, are sexually active, and are at increased risk for HIV infection.  You take drugs by injection.  You are sexually active with a partner who has HIV.  Talk with your health care provider about whether you are at high risk of being infected with HIV. If you choose to begin PrEP, you should first be tested for HIV. You should then be tested every 3 months for as long as you are taking PrEP.  Osteoporosis is a disease in which the bones lose minerals and strength   with aging. This can result in serious bone fractures or breaks. The risk of osteoporosis can be identified using a bone density scan. Women ages 9 years and over and women at risk for fractures or osteoporosis should discuss screening with their health care providers. Ask your health care provider whether you should take a calcium supplement or vitamin D to reduce the rate of osteoporosis.  Menopause can be associated with physical symptoms and risks. Hormone replacement therapy is available to decrease symptoms and risks. You should talk to your health care provider about whether hormone replacement therapy is right for you.  Use sunscreen. Apply sunscreen liberally and repeatedly throughout the day. You should seek shade when your shadow is shorter than you. Protect yourself by wearing long sleeves, pants, a wide-brimmed hat, and sunglasses year round, whenever you are outdoors.  Once a month, do a whole body skin exam, using a mirror to look at the skin on your back. Tell your health care provider of new moles, moles that have irregular borders, moles that are larger than a pencil eraser, or moles that have changed in shape or color.  Stay current with required vaccines (immunizations).  Influenza vaccine. All adults should be immunized every year.  Tetanus, diphtheria, and acellular pertussis (Td, Tdap) vaccine. Pregnant women should  receive 1 dose of Tdap vaccine during each pregnancy. The dose should be obtained regardless of the length of time since the last dose. Immunization is preferred during the 27th-36th week of gestation. An adult who has not previously received Tdap or who does not know her vaccine status should receive 1 dose of Tdap. This initial dose should be followed by tetanus and diphtheria toxoids (Td) booster doses every 10 years. Adults with an unknown or incomplete history of completing a 3-dose immunization series with Td-containing vaccines should begin or complete a primary immunization series including a Tdap dose. Adults should receive a Td booster every 10 years.  Varicella vaccine. An adult without evidence of immunity to varicella should receive 2 doses or a second dose if she has previously received 1 dose. Pregnant females who do not have evidence of immunity should receive the first dose after pregnancy. This first dose should be obtained before leaving the health care facility. The second dose should be obtained 4-8 weeks after the first dose.  Human papillomavirus (HPV) vaccine. Females aged 13-26 years who have not received the vaccine previously should obtain the 3-dose series. The vaccine is not recommended for use in pregnant females. However, pregnancy testing is not needed before receiving a dose. If a female is found to be pregnant after receiving a dose, no treatment is needed. In that case, the remaining doses should be delayed until after the pregnancy. Immunization is recommended for any person with an immunocompromised condition through the age of 38 years if she did not get any or all doses earlier. During the 3-dose series, the second dose should be obtained 4-8 weeks after the first dose. The third dose should be obtained 24 weeks after the first dose and 16 weeks after the second dose.  Zoster vaccine. One dose is recommended for adults aged 17 years or older unless certain conditions are  present.  Measles, mumps, and rubella (MMR) vaccine. Adults born before [birth year] generally are considered immune to measles and mumps. Adults born in [birth year] or later should have 1 or more doses of MMR vaccine unless there is a contraindication to the vaccine or there is laboratory evidence of immunity to  each of the three diseases. A routine second dose of MMR vaccine should be obtained at least 28 days after the first dose for students attending postsecondary schools, health care workers, or international travelers. People who received inactivated measles vaccine or an unknown type of measles vaccine during 1963-1967 should receive 2 doses of MMR vaccine. People who received inactivated mumps vaccine or an unknown type of mumps vaccine before 1979 and are at high risk for mumps infection should consider immunization with 2 doses of MMR vaccine. For females of childbearing age, rubella immunity should be determined. If there is no evidence of immunity, females who are not pregnant should be vaccinated. If there is no evidence of immunity, females who are pregnant should delay immunization until after pregnancy. Unvaccinated health care workers born before 1957 who lack laboratory evidence of measles, mumps, or rubella immunity or laboratory confirmation of disease should consider measles and mumps immunization with 2 doses of MMR vaccine or rubella immunization with 1 dose of MMR vaccine.  Pneumococcal 13-valent conjugate (PCV13) vaccine. When indicated, a person who is uncertain of her immunization history and has no record of immunization should receive the PCV13 vaccine. An adult aged 19 years or older who has certain medical conditions and has not been previously immunized should receive 1 dose of PCV13 vaccine. This PCV13 should be followed with a dose of pneumococcal polysaccharide (PPSV23) vaccine. The PPSV23 vaccine dose should be obtained at least 8 weeks after the dose of PCV13 vaccine. An adult aged 19  years or older who has certain medical conditions and previously received 1 or more doses of PPSV23 vaccine should receive 1 dose of PCV13. The PCV13 vaccine dose should be obtained 1 or more years after the last PPSV23 vaccine dose.  Pneumococcal polysaccharide (PPSV23) vaccine. When PCV13 is also indicated, PCV13 should be obtained first. All adults aged 65 years and older should be immunized. An adult younger than age 65 years who has certain medical conditions should be immunized. Any person who resides in a nursing home or long-term care facility should be immunized. An adult smoker should be immunized. People with an immunocompromised condition and certain other conditions should receive both PCV13 and PPSV23 vaccines. People with human immunodeficiency virus (HIV) infection should be immunized as soon as possible after diagnosis. Immunization during chemotherapy or radiation therapy should be avoided. Routine use of PPSV23 vaccine is not recommended for American Indians, [place] Natives, or people younger than 65 years unless there are medical conditions that require PPSV23 vaccine. When indicated, people who have unknown immunization and have no record of immunization should receive PPSV23 vaccine. One-time revaccination 5 years after the first dose of PPSV23 is recommended for people aged 19-64 years who have chronic kidney failure, nephrotic syndrome, asplenia, or immunocompromised conditions. People who received 1-2 doses of PPSV23 before age 65 years should receive another dose of PPSV23 vaccine at age 65 years or later if at least 5 years have passed since the previous dose. Doses of PPSV23 are not needed for people immunized with PPSV23 at or after age 65 years.  Meningococcal vaccine. Adults with asplenia or persistent complement component deficiencies should receive 2 doses of quadrivalent meningococcal conjugate (MenACWY-D) vaccine. The doses should be obtained at least 2 months apart.  Microbiologists working with certain meningococcal bacteria, military recruits, people at risk during an outbreak, and people who travel to or live in countries with a high rate of meningitis should be immunized. A first-year college student up through age   21 years who is living in a residence hall should receive a dose if she did not receive a dose on or after her 16th birthday. Adults who have certain high-risk conditions should receive one or more doses of vaccine.  Hepatitis A vaccine. Adults who wish to be protected from this disease, have certain high-risk conditions, work with hepatitis A-infected animals, work in hepatitis A research labs, or travel to or work in countries with a high rate of hepatitis A should be immunized. Adults who were previously unvaccinated and who anticipate close contact with an international adoptee during the first 60 days after arrival in the [place] from a country with a high rate of hepatitis A should be immunized.  Hepatitis B vaccine. Adults who wish to be protected from this disease, have certain high-risk conditions, may be exposed to blood or other infectious body fluids, are household contacts or sex partners of hepatitis B positive people, are clients or workers in certain care facilities, or travel to or work in countries with a high rate of hepatitis B should be immunized.  Haemophilus influenzae type b (Hib) vaccine. A previously unvaccinated person with asplenia or sickle cell disease or having a scheduled splenectomy should receive 1 dose of Hib vaccine. Regardless of previous immunization, a recipient of a hematopoietic stem cell transplant should receive a 3-dose series 6-12 months after her successful transplant. Hib vaccine is not recommended for adults with HIV infection. Preventive Services / Frequency Ages 64 to 68 years  Blood pressure check.** / Every 1 to 2 years.  Lipid and cholesterol check.** / Every 5 years beginning at age  22.  Clinical breast exam.** / Every 3 years for women in their 88s and 53s.  BRCA-related cancer risk assessment.** / For women who have family members with a BRCA-related cancer (breast, ovarian, tubal, or peritoneal cancers).  Pap test.** / Every 2 years from ages 90 through 51. Every 3 years starting at age 21 through age 56 or 3 with a history of 3 consecutive normal Pap tests.  HPV screening.** / Every 3 years from ages 24 through ages 1 to 46 with a history of 3 consecutive normal Pap tests.  Hepatitis C blood test.** / For any individual with known risks for hepatitis C.  Skin self-exam. / Monthly.  Influenza vaccine. / Every year.  Tetanus, diphtheria, and acellular pertussis (Tdap, Td) vaccine.** / Consult your health care provider. Pregnant women should receive 1 dose of Tdap vaccine during each pregnancy. 1 dose of Td every 10 years.  Varicella vaccine.** / Consult your health care provider. Pregnant females who do not have evidence of immunity should receive the first dose after pregnancy.  HPV vaccine. / 3 doses over 6 months, if 72 and younger. The vaccine is not recommended for use in pregnant females. However, pregnancy testing is not needed before receiving a dose.  Measles, mumps, rubella (MMR) vaccine.** / You need at least 1 dose of MMR if you were born in 1957 or later. You may also need a 2nd dose. For females of childbearing age, rubella immunity should be determined. If there is no evidence of immunity, females who are not pregnant should be vaccinated. If there is no evidence of immunity, females who are pregnant should delay immunization until after pregnancy.  Pneumococcal 13-valent conjugate (PCV13) vaccine.** / Consult your health care provider.  Pneumococcal polysaccharide (PPSV23) vaccine.** / 1 to 2 doses if you smoke cigarettes or if you have certain conditions.  Meningococcal vaccine.** /  1 dose if you are age 19 to 21 years and a first-year college  student living in a residence hall, or have one of several medical conditions, you need to get vaccinated against meningococcal disease. You may also need additional booster doses.  Hepatitis A vaccine.** / Consult your health care provider.  Hepatitis B vaccine.** / Consult your health care provider.  Haemophilus influenzae type b (Hib) vaccine.** / Consult your health care provider. Ages 40 to 64 years  Blood pressure check.** / Every 1 to 2 years.  Lipid and cholesterol check.** / Every 5 years beginning at age 20 years.  Lung cancer screening. / Every year if you are aged 55-80 years and have a 30-pack-year history of smoking and currently smoke or have quit within the past 15 years. Yearly screening is stopped once you have quit smoking for at least 15 years or develop a health problem that would prevent you from having lung cancer treatment.  Clinical breast exam.** / Every year after age 40 years.  BRCA-related cancer risk assessment.** / For women who have family members with a BRCA-related cancer (breast, ovarian, tubal, or peritoneal cancers).  Mammogram.** / Every year beginning at age 40 years and continuing for as long as you are in good health. Consult with your health care provider.  Pap test.** / Every 3 years starting at age 30 years through age 65 or 70 years with a history of 3 consecutive normal Pap tests.  HPV screening.** / Every 3 years from ages 30 years through ages 65 to 70 years with a history of 3 consecutive normal Pap tests.  Fecal occult blood test (FOBT) of stool. / Every year beginning at age 50 years and continuing until age 75 years. You may not need to do this test if you get a colonoscopy every 10 years.  Flexible sigmoidoscopy or colonoscopy.** / Every 5 years for a flexible sigmoidoscopy or every 10 years for a colonoscopy beginning at age 50 years and continuing until age 75 years.  Hepatitis C blood test.** / For all people born from 1945 through  1965 and any individual with known risks for hepatitis C.  Skin self-exam. / Monthly.  Influenza vaccine. / Every year.  Tetanus, diphtheria, and acellular pertussis (Tdap/Td) vaccine.** / Consult your health care provider. Pregnant women should receive 1 dose of Tdap vaccine during each pregnancy. 1 dose of Td every 10 years.  Varicella vaccine.** / Consult your health care provider. Pregnant females who do not have evidence of immunity should receive the first dose after pregnancy.  Zoster vaccine.** / 1 dose for adults aged 60 years or older.  Measles, mumps, rubella (MMR) vaccine.** / You need at least 1 dose of MMR if you were born in 1957 or later. You may also need a 2nd dose. For females of childbearing age, rubella immunity should be determined. If there is no evidence of immunity, females who are not pregnant should be vaccinated. If there is no evidence of immunity, females who are pregnant should delay immunization until after pregnancy.  Pneumococcal 13-valent conjugate (PCV13) vaccine.** / Consult your health care provider.  Pneumococcal polysaccharide (PPSV23) vaccine.** / 1 to 2 doses if you smoke cigarettes or if you have certain conditions.  Meningococcal vaccine.** / Consult your health care provider.  Hepatitis A vaccine.** / Consult your health care provider.  Hepatitis B vaccine.** / Consult your health care provider.  Haemophilus influenzae type b (Hib) vaccine.** / Consult your health care provider. Ages 65   years and over  Blood pressure check.** / Every 1 to 2 years.  Lipid and cholesterol check.** / Every 5 years beginning at age 22 years.  Lung cancer screening. / Every year if you are aged 73-80 years and have a 30-pack-year history of smoking and currently smoke or have quit within the past 15 years. Yearly screening is stopped once you have quit smoking for at least 15 years or develop a health problem that would prevent you from having lung cancer  treatment.  Clinical breast exam.** / Every year after age 4 years.  BRCA-related cancer risk assessment.** / For women who have family members with a BRCA-related cancer (breast, ovarian, tubal, or peritoneal cancers).  Mammogram.** / Every year beginning at age 40 years and continuing for as long as you are in good health. Consult with your health care provider.  Pap test.** / Every 3 years starting at age 9 years through age 34 or 91 years with 3 consecutive normal Pap tests. Testing can be stopped between 65 and 70 years with 3 consecutive normal Pap tests and no abnormal Pap or HPV tests in the past 10 years.  HPV screening.** / Every 3 years from ages 57 years through ages 64 or 45 years with a history of 3 consecutive normal Pap tests. Testing can be stopped between 65 and 70 years with 3 consecutive normal Pap tests and no abnormal Pap or HPV tests in the past 10 years.  Fecal occult blood test (FOBT) of stool. / Every year beginning at age 15 years and continuing until age 17 years. You may not need to do this test if you get a colonoscopy every 10 years.  Flexible sigmoidoscopy or colonoscopy.** / Every 5 years for a flexible sigmoidoscopy or every 10 years for a colonoscopy beginning at age 86 years and continuing until age 71 years.  Hepatitis C blood test.** / For all people born from [birth year] through 1966 and any individual with known risks for hepatitis C.  Osteoporosis screening.** / A one-time screening for women ages 83 years and over and women at risk for fractures or osteoporosis.  Skin self-exam. / Monthly.  Influenza vaccine. / Every year.  Tetanus, diphtheria, and acellular pertussis (Tdap/Td) vaccine.** / 1 dose of Td every 10 years.  Varicella vaccine.** / Consult your health care provider.  Zoster vaccine.** / 1 dose for adults aged 61 years or older.  Pneumococcal 13-valent conjugate (PCV13) vaccine.** / Consult your health care provider.  Pneumococcal  polysaccharide (PPSV23) vaccine.** / 1 dose for all adults aged 28 years and older.  Meningococcal vaccine.** / Consult your health care provider.  Hepatitis A vaccine.** / Consult your health care provider.  Hepatitis B vaccine.** / Consult your health care provider.  Haemophilus influenzae type b (Hib) vaccine.** / Consult your health care provider. ** Family history and personal history of risk and conditions may change your health care provider's recommendations. Document Released: 11/27/2001 Document Revised: 02/15/2014 Document Reviewed: 02/26/2011 [facility] Patient Information 2015 [place], [place]. This information is not intended to replace advice given to you by your health care provider. Make sure you discuss any questions you have with your health care provider.

## 2015-01-12 NOTE — Progress Notes (Signed)
Having very heavy painful periods since she has had BTL and no longer using Mirena.  Periods last 5-7 days.

## 2015-01-12 NOTE — Progress Notes (Signed)
GYNECOLOGY CLINIC ANNUAL PREVENTATIVE CARE ENCOUNTER NOTE  Subjective:     Joan Heath is a 29 y.o. [phone] female here for a routine annual gynecologic exam.  Current complaints: menorrhagia.  Reports heavy menstrual periods needing multiple pads a day, associated with abdominal cramping that last for 5-7 days.  Regular cycles.  Patient had a BTL in the past.  Denies any anemia symptoms.  Of note, patient's sister was diagnosed with breast cancer and is undergoing treatment. Patient is wondering about screening.    Gynecologic History No LMP recorded. Contraception: tubal ligation Last Pap: 06/25/13. Results were: normal  Obstetric History OB History  Gravida Para Term Preterm AB SAB TAB Ectopic Multiple Living  _0 # Outcome Date GA Lbr Len/2nd Weight Sex Delivery Anes PTL Lv  2 Term 01/22/14 [redacted]w[redacted]d 8 lb 8.5 oz (3.87 kg) F CS-LTranv Spinal  Y  1 Term 2007    M CS-LTranv   Y      History   Social History  . Marital Status: Single    Spouse Name: N/A  . Number of Children: N/A  . Years of Education: N/A   Occupational History  . Not on file.   Social History Main Topics  . Smoking status: Former Smoker -- 0.50 packs/day    Types: Cigarettes    Quit date: 07/21/2013  . Smokeless tobacco: Never Used  . Alcohol Use: No  . Drug Use: 2.00 per week    Special: Marijuana  . Sexual Activity: Yes    Birth Control/ Protection: None   Other Topics Concern  . Not on file   Social History Narrative   The following portions of the patient's history were reviewed and updated as appropriate: allergies, current medications, past family history, past medical history, past social history, past surgical history and problem list.  Review of Systems Pertinent items are noted in HPI.   Objective:   There were no vitals taken for this visit. GENERAL: Well-developed, well-nourished female in no acute distress.  HEENT: Normocephalic, atraumatic. Sclerae anicteric.   NECK: Supple. Normal thyroid.  LUNGS: Clear to auscultation bilaterally.  HEART: Regular rate and rhythm. BREASTS: Symmetric in size. No masses, skin changes, nipple drainage, or lymphadenopathy. ABDOMEN: Soft, nontender, nondistended. No organomegaly. PELVIC: Normal external female genitalia. Vagina is pink and rugated.  Normal discharge. Normal cervix contour. Pap smear obtained. Uterus is normal in size. No adnexal mass or tenderness.  EXTREMITIES: No cyanosis, clubbing, or edema, 2+ distal pulses.   Assessment:   Annual gynecologic examination Menorrhagia FH of breast cancer in 219year old sister   Plan:  Pap done, will follow up results and manage accordingly. Will obtain pelvic ultrasound to evaluate for structural etiologies of AUB. Screening mammogram ordered given FH of breast cancer; not interested in BRCA testing for now.  Discussed management options for abnormal uterine bleeding including tranexamic acid (Lysteda), oral progesterone, Depo Provera, Mirena IUD, endometrial ablation (Novasure/Hydrothermal Ablation) or hysterectomy as definitive surgical management.  Discussed risks and benefits of each method.   Patient desires hydrothermal ablation, risks and benefits reviewed.   She was told that she will be contacted by our surgical scheduler regarding the time and date of her surgery.  Printed patient education handouts about the procedure were given to the patient to review at home. Lysteda prescribed as needed for now,  bleeding precautions reviewed.  Routine preventative health maintenance measures emphasized  Total encounter time devoted to discussing menorrhagia and FH breast cancer: 15 minutes. Over 50% of encounter spent on counseling and coordination of care.    Verita Schneiders, MD, [place] Attending [place] for [employer], [place]

## 2015-01-14 LAB — CYTOLOGY - PAP

## 2015-01-14 MED ORDER — AZITHROMYCIN 500 MG PO TABS: 1000.0000 mg | ORAL_TABLET | Freq: Once | ORAL | Status: DC

## 2015-01-14 NOTE — Addendum Note (Signed)
Addended by: Verita Schneiders A on: 01/14/2015 03:20 PM   Modules accepted: Orders

## 2015-01-18 ENCOUNTER — Ambulatory Visit (HOSPITAL_COMMUNITY): Payer: Medicaid Other

## 2015-01-19 ENCOUNTER — Ambulatory Visit (HOSPITAL_COMMUNITY): Payer: Medicaid Other

## 2015-01-21 ENCOUNTER — Ambulatory Visit (HOSPITAL_COMMUNITY)
Admission: RE | Admit: 2015-01-21 | Discharge: 2015-01-21 | Disposition: A | Discharge status: Home / Self Care | Payer: Medicaid Other | Source: Ambulatory Visit | Attending: Obstetrics & Gynecology | Admitting: Obstetrics & Gynecology

## 2015-01-21 DIAGNOSIS — N946 Dysmenorrhea, unspecified: Secondary | ICD-10-CM | POA: Diagnosis not present

## 2015-01-21 DIAGNOSIS — Z1231 Encounter for screening mammogram for malignant neoplasm of breast: Secondary | ICD-10-CM | POA: Diagnosis not present

## 2015-01-21 DIAGNOSIS — Z803 Family history of malignant neoplasm of breast: Secondary | ICD-10-CM

## 2015-01-21 DIAGNOSIS — N920 Excessive and frequent menstruation with regular cycle: Secondary | ICD-10-CM

## 2015-01-24 ENCOUNTER — Other Ambulatory Visit: Payer: Self-pay | Admitting: Obstetrics & Gynecology

## 2015-01-24 DIAGNOSIS — Z803 Family history of malignant neoplasm of breast: Secondary | ICD-10-CM

## 2015-02-16 ENCOUNTER — Encounter (HOSPITAL_COMMUNITY): Payer: Self-pay | Admitting: *Deleted

## 2015-03-09 ENCOUNTER — Encounter (HOSPITAL_COMMUNITY): Payer: Self-pay | Admitting: Anesthesiology

## 2015-03-09 ENCOUNTER — Encounter (HOSPITAL_COMMUNITY)
Admission: RE | Disposition: A | Discharge status: Home / Self Care | Payer: Self-pay | Source: Ambulatory Visit | Attending: Obstetrics & Gynecology

## 2015-03-09 ENCOUNTER — Ambulatory Visit (HOSPITAL_COMMUNITY): Payer: Medicaid Other | Admitting: Anesthesiology

## 2015-03-09 ENCOUNTER — Ambulatory Visit (HOSPITAL_COMMUNITY)
Admission: RE | Admit: 2015-03-09 | Discharge: 2015-03-09 | Disposition: A | Discharge status: Home / Self Care | Payer: Medicaid Other | Source: Ambulatory Visit | Attending: Obstetrics & Gynecology | Admitting: Obstetrics & Gynecology

## 2015-03-09 DIAGNOSIS — E669 Obesity, unspecified: Secondary | ICD-10-CM | POA: Insufficient documentation

## 2015-03-09 DIAGNOSIS — F1910 Other psychoactive substance abuse, uncomplicated: Secondary | ICD-10-CM | POA: Diagnosis not present

## 2015-03-09 DIAGNOSIS — Z791 Long term (current) use of non-steroidal anti-inflammatories (NSAID): Secondary | ICD-10-CM | POA: Diagnosis not present

## 2015-03-09 DIAGNOSIS — K219 Gastro-esophageal reflux disease without esophagitis: Secondary | ICD-10-CM | POA: Insufficient documentation

## 2015-03-09 DIAGNOSIS — Z9851 Tubal ligation status: Secondary | ICD-10-CM | POA: Insufficient documentation

## 2015-03-09 DIAGNOSIS — N920 Excessive and frequent menstruation with regular cycle: Secondary | ICD-10-CM | POA: Diagnosis not present

## 2015-03-09 DIAGNOSIS — N840 Polyp of corpus uteri: Secondary | ICD-10-CM | POA: Diagnosis not present

## 2015-03-09 DIAGNOSIS — G43009 Migraine without aura, not intractable, without status migrainosus: Secondary | ICD-10-CM | POA: Insufficient documentation

## 2015-03-09 DIAGNOSIS — F319 Bipolar disorder, unspecified: Secondary | ICD-10-CM | POA: Insufficient documentation

## 2015-03-09 DIAGNOSIS — Z87891 Personal history of nicotine dependence: Secondary | ICD-10-CM | POA: Insufficient documentation

## 2015-03-09 DIAGNOSIS — Z6836 Body mass index (BMI) 36.0-36.9, adult: Secondary | ICD-10-CM | POA: Insufficient documentation

## 2015-03-09 DIAGNOSIS — Z9889 Other specified postprocedural states: Secondary | ICD-10-CM | POA: Insufficient documentation

## 2015-03-09 HISTORY — DX: Headache: R51

## 2015-03-09 HISTORY — DX: Gastro-esophageal reflux disease without esophagitis: K21.9

## 2015-03-09 HISTORY — PX: DILITATION & CURRETTAGE/HYSTROSCOPY WITH HYDROTHERMAL ABLATION: SHX5570

## 2015-03-09 HISTORY — DX: Headache, unspecified: R51.9

## 2015-03-09 LAB — CBC
HCT: 40 % (ref 36.0–46.0)
HEMOGLOBIN: 13.7 g/dL (ref 12.0–15.0)
MCH: 29.2 pg (ref 26.0–34.0)
MCHC: 34.3 g/dL (ref 30.0–36.0)
MCV: 85.3 fL (ref 78.0–100.0)
Platelets: 225 10*3/uL (ref 150–400)
RBC: 4.69 MIL/uL (ref 3.87–5.11)
RDW: 13.4 % (ref 11.5–15.5)
WBC: 12 10*3/uL — AB (ref 4.0–10.5)

## 2015-03-09 LAB — PREGNANCY, URINE: Preg Test, Ur: NEGATIVE

## 2015-03-09 SURGERY — DILATATION & CURETTAGE/HYSTEROSCOPY WITH HYDROTHERMAL ABLATION
Anesthesia: General | Site: Vagina

## 2015-03-09 MED ORDER — FENTANYL CITRATE (PF) 100 MCG/2ML IJ SOLN
INTRAMUSCULAR | Status: DC | PRN
  Administered 2015-03-09 (×2): 50 ug via INTRAVENOUS

## 2015-03-09 MED ORDER — MIDAZOLAM HCL 2 MG/2ML IJ SOLN
INTRAMUSCULAR | Status: AC
  Filled 2015-03-09: qty 2

## 2015-03-09 MED ORDER — DEXAMETHASONE SODIUM PHOSPHATE 10 MG/ML IJ SOLN
INTRAMUSCULAR | Status: DC | PRN
  Administered 2015-03-09: 5 mg via INTRAVENOUS

## 2015-03-09 MED ORDER — OXYCODONE-ACETAMINOPHEN 5-325 MG PO TABS
1.0000 | ORAL_TABLET | ORAL | Status: DC | PRN
  Administered 2015-03-09: 1 via ORAL

## 2015-03-09 MED ORDER — LACTATED RINGERS IV SOLN
INTRAVENOUS | Status: DC
  Administered 2015-03-09: 18:00:00 via INTRAVENOUS

## 2015-03-09 MED ORDER — SCOPOLAMINE 1 MG/3DAYS TD PT72
1.0000 | MEDICATED_PATCH | Freq: Once | TRANSDERMAL | Status: DC
  Administered 2015-03-09: 1.5 mg via TRANSDERMAL

## 2015-03-09 MED ORDER — IBUPROFEN 800 MG PO TABS: 800.0000 mg | ORAL_TABLET | Freq: Three times a day (TID) | ORAL | Status: DC | PRN

## 2015-03-09 MED ORDER — OXYCODONE-ACETAMINOPHEN 5-325 MG PO TABS: 1.0000 | ORAL_TABLET | Freq: Four times a day (QID) | ORAL | Status: DC | PRN

## 2015-03-09 MED ORDER — FENTANYL CITRATE (PF) 100 MCG/2ML IJ SOLN
INTRAMUSCULAR | Status: AC
  Filled 2015-03-09: qty 2

## 2015-03-09 MED ORDER — MEPERIDINE HCL 25 MG/ML IJ SOLN: 6.2500 mg | INTRAMUSCULAR | Status: DC | PRN

## 2015-03-09 MED ORDER — BUPIVACAINE HCL (PF) 0.5 % IJ SOLN
INTRAMUSCULAR | Status: AC
  Filled 2015-03-09: qty 30

## 2015-03-09 MED ORDER — LACTATED RINGERS IV SOLN
INTRAVENOUS | Status: DC
  Administered 2015-03-09 (×2): via INTRAVENOUS

## 2015-03-09 MED ORDER — KETOROLAC TROMETHAMINE 30 MG/ML IJ SOLN
INTRAMUSCULAR | Status: AC
  Filled 2015-03-09: qty 1

## 2015-03-09 MED ORDER — DEXAMETHASONE SODIUM PHOSPHATE 10 MG/ML IJ SOLN
INTRAMUSCULAR | Status: AC
  Filled 2015-03-09: qty 1

## 2015-03-09 MED ORDER — METOCLOPRAMIDE HCL 5 MG/ML IJ SOLN
INTRAMUSCULAR | Status: AC
  Filled 2015-03-09: qty 2

## 2015-03-09 MED ORDER — PROPOFOL 10 MG/ML IV BOLUS
INTRAVENOUS | Status: AC
  Filled 2015-03-09: qty 20

## 2015-03-09 MED ORDER — PROPOFOL 10 MG/ML IV BOLUS
INTRAVENOUS | Status: DC | PRN
  Administered 2015-03-09: 200 mg via INTRAVENOUS

## 2015-03-09 MED ORDER — BUPIVACAINE HCL 0.5 % IJ SOLN
INTRAMUSCULAR | Status: DC | PRN
  Administered 2015-03-09: 30 mL

## 2015-03-09 MED ORDER — SCOPOLAMINE 1 MG/3DAYS TD PT72
MEDICATED_PATCH | TRANSDERMAL | Status: AC
  Filled 2015-03-09: qty 1

## 2015-03-09 MED ORDER — FENTANYL CITRATE (PF) 100 MCG/2ML IJ SOLN
25.0000 ug | INTRAMUSCULAR | Status: DC | PRN
  Administered 2015-03-09 (×2): 50 ug via INTRAVENOUS

## 2015-03-09 MED ORDER — FENTANYL CITRATE (PF) 100 MCG/2ML IJ SOLN
INTRAMUSCULAR | Status: AC
  Administered 2015-03-09: 50 ug via INTRAVENOUS
  Filled 2015-03-09: qty 2

## 2015-03-09 MED ORDER — HYDROCODONE-ACETAMINOPHEN 7.5-325 MG PO TABS: 1.0000 | ORAL_TABLET | Freq: Once | ORAL | Status: DC | PRN

## 2015-03-09 MED ORDER — KETOROLAC TROMETHAMINE 30 MG/ML IJ SOLN
INTRAMUSCULAR | Status: DC | PRN
  Administered 2015-03-09: 30 mg via INTRAVENOUS

## 2015-03-09 MED ORDER — OXYCODONE-ACETAMINOPHEN 5-325 MG PO TABS
ORAL_TABLET | ORAL | Status: AC
  Administered 2015-03-09: 1 via ORAL
  Filled 2015-03-09: qty 1

## 2015-03-09 MED ORDER — LIDOCAINE HCL (CARDIAC) 20 MG/ML IV SOLN
INTRAVENOUS | Status: DC | PRN
  Administered 2015-03-09: 100 mg via INTRAVENOUS

## 2015-03-09 MED ORDER — MIDAZOLAM HCL 2 MG/2ML IJ SOLN
INTRAMUSCULAR | Status: DC | PRN
Start: 2015-03-09 — End: 2015-03-09
  Administered 2015-03-09: 2 mg via INTRAVENOUS
  Administered 2015-03-09: 1 mg via INTRAVENOUS

## 2015-03-09 MED ORDER — METOCLOPRAMIDE HCL 5 MG/ML IJ SOLN
10.0000 mg | Freq: Once | INTRAMUSCULAR | Status: AC | PRN
  Administered 2015-03-09: 10 mg via INTRAVENOUS

## 2015-03-09 MED ORDER — ONDANSETRON HCL 4 MG/2ML IJ SOLN
INTRAMUSCULAR | Status: DC | PRN
  Administered 2015-03-09: 4 mg via INTRAVENOUS

## 2015-03-09 MED ORDER — ONDANSETRON HCL 4 MG/2ML IJ SOLN
INTRAMUSCULAR | Status: AC
  Filled 2015-03-09: qty 2

## 2015-03-09 MED ORDER — SODIUM CHLORIDE 0.9 % IR SOLN
Status: DC | PRN
  Administered 2015-03-09: 3000 mL

## 2015-03-09 MED ORDER — DOCUSATE SODIUM 100 MG PO CAPS: 100.0000 mg | ORAL_CAPSULE | Freq: Two times a day (BID) | ORAL | Status: DC | PRN

## 2015-03-09 MED ORDER — LIDOCAINE HCL (CARDIAC) 20 MG/ML IV SOLN
INTRAVENOUS | Status: AC
  Filled 2015-03-09: qty 5

## 2015-03-09 SURGICAL SUPPLY — 13 items
CATH ROBINSON RED A/P 16FR (CATHETERS) ×2 IMPLANT
CLOTH BEACON ORANGE TIMEOUT ST (SAFETY) ×2 IMPLANT
CONTAINER PREFILL 10% NBF 60ML (FORM) ×4 IMPLANT
GLOVE BIOGEL PI IND STRL 7.0 (GLOVE) ×2 IMPLANT
GLOVE BIOGEL PI INDICATOR 7.0 (GLOVE) ×2
GLOVE ECLIPSE 7.0 STRL STRAW (GLOVE) ×2 IMPLANT
GOWN STRL REUS W/TWL LRG LVL3 (GOWN DISPOSABLE) ×4 IMPLANT
NEEDLE SPNL 20GX3.5 QUINCKE YW (NEEDLE) ×2 IMPLANT
PACK VAGINAL MINOR WOMEN LF (CUSTOM PROCEDURE TRAY) ×2 IMPLANT
PAD OB MATERNITY 4.3X12.25 (PERSONAL CARE ITEMS) ×2 IMPLANT
SET GENESYS HTA PROCERVA (MISCELLANEOUS) ×2 IMPLANT
TOWEL OR 17X24 6PK STRL BLUE (TOWEL DISPOSABLE) ×4 IMPLANT
WATER STERILE IRR 1000ML POUR (IV SOLUTION) ×2 IMPLANT

## 2015-03-09 NOTE — Op Note (Signed)
PREOPERATIVE DIAGNOSIS:  Menorrhagia POSTOPERATIVE DIAGNOSIS: The same PROCEDURE: Hysteroscopy,  Hydrothermal Endometrial Ablation SURGEON:  Dr. Verita Schneiders  INDICATIONS: 29 y.o. [id number] here for scheduled surgery for menorrhagia. Risks of surgery were discussed with the patient including but not limited to: bleeding; infection which may require antibiotics; injury to uterus leading to risk of injury to surrounding intraperitoneal organs, burn injury to vagina or other organs, need for additional procedures including laparoscopy or laparotomy, inability to complete ablation due to uterine or mechanical anomaly, and other postoperative/anesthesia complications.  Patient was informed that there is a high likelihood of success of controlling her symptoms; however about 5% of patients may require further intervention.  Written informed consent was obtained.    FINDINGS:  9 week size uterus.  Normal endometrium with small polyp (thin about 7 mm in size on narrow stalk) noted in left fundal area near ostium.  Normal ostia bilaterally.  ANESTHESIA:   General, paracervical block. INTRAVENOUS FLUIDS:  1700 ml of LR ESTIMATED BLOOD LOSS:  10 ml SPECIMENS: None COMPLICATIONS:  None immediate.  PROCEDURE DETAILS:  The patient was then taken to the operating room where general anesthesia was administered and was found to be adequate.  After an adequate timeout was performed, she was placed in the dorsal lithotomy position and examined; then prepped and draped in the sterile manner.   Her bladder was catheterized for clear, yellow urine. A speculum was then placed in the patient's vagina and a single tooth tenaculum was applied to the anterior lip of the cervix.   A paracervical block using 30 ml of 0.5% Marcaine was administered.  The cervix was sounded to 9 cm and dilated manually with metal dilators to accommodate the hydrothermal ablation hysteroscopic apparatus.  Once the cervix was dilated, the  hysteroscope was inserted under direct visualization using normal saline as a suspension medium.  The uterine cavity was carefully examined, both ostia were recognized, and normal endometrium with the small left sided polyp was noted.   The hydrothermal ablation was then carried out as per protocol.   Complete ablation of the endometrium was observed and the hysteroscope was removed under direct visualization. The ablated polyp was removed using polyp forceps.   No complications were observed.  The tenaculum was removed from the anterior lip of the cervix, and the vaginal speculum was removed after noting good hemostasis.  The patient tolerated the procedure well and was taken to the recovery area awake, extubated and in stable condition.  The patient will be discharged to home as per PACU criteria.  Routine postoperative instructions given.  She was prescribed Percocet, Ibuprofen and Colace.  She will follow up in the clinic in 3-4 weeks  for postoperative evaluation.   Verita Schneiders, MD, [place] Attending [place], [facility]

## 2015-03-09 NOTE — Anesthesia Preprocedure Evaluation (Addendum)
Anesthesia Evaluation  Patient identified by MRN, date of birth, ID band Patient awake    Reviewed: Allergy & Precautions, NPO status , Patient's Chart, lab work & pertinent test results  Airway Mallampati: III  TM Distance: >3 FB Neck ROM: Full    Dental no notable dental hx. (+) Teeth Intact   Pulmonary former smoker,  breath sounds clear to auscultation  Pulmonary exam normal       Cardiovascular negative cardio ROS Normal cardiovascular examRhythm:Regular Rate:Normal     Neuro/Psych  Headaches, PSYCHIATRIC DISORDERS Anxiety Bipolar Disorder    GI/Hepatic GERD-  Medicated and Controlled,(+)     substance abuse  marijuana use,   Endo/Other  Obesity  Renal/GU negative Renal ROS  negative genitourinary   Musculoskeletal negative musculoskeletal ROS (+)   Abdominal (+) + obese,   Peds  Hematology negative hematology ROS (+)   Anesthesia Other Findings Numerous body piercings as well as tongue piercing.  Reproductive/Obstetrics menorrhagia                            Anesthesia Physical Anesthesia Plan  ASA: II  Anesthesia Plan: General   Post-op Pain Management:    Induction: Intravenous  Airway Management Planned: LMA  Additional Equipment:   Intra-op Plan:   Post-operative Plan: Extubation in OR  Informed Consent: I have reviewed the patients History and Physical, chart, labs and discussed the procedure including the risks, benefits and alternatives for the proposed anesthesia with the patient or authorized representative who has indicated his/her understanding and acceptance.   Dental advisory given  Plan Discussed with: CRNA, Anesthesiologist and Surgeon  Anesthesia Plan Comments:         Anesthesia Quick Evaluation

## 2015-03-09 NOTE — H&P (Signed)
Preoperative History and Physical  Joan Heath is a 29 y.o. [id number] here for surgical management of menorrhagia.   No significant preoperative concerns.  Proposed surgery: Hysteroscopic hydrothermal endometrial ablation  Past Medical History  Diagnosis Date  . Bipolar 1 disorder     no meds currently  . Anxiety     no meds  . GERD (gastroesophageal reflux disease)     occasional - diet controlled  . Headache     migraines - otc meds prn   Past Surgical History  Procedure Laterality Date  . Cesarean section  2007  . Appendectomy    . Cholecystectomy    . Cesarean section with bilateral tubal ligation N/A 01/22/2014    Procedure: CESAREAN SECTION WITH BILATERAL TUBAL LIGATION;  Surgeon: Truett Mainland, DO;  Location: [facility];  Service: Gynecology;  Laterality: N/A;  . Wisdom tooth extraction    . Tubal ligation     OB History  Gravida Para Term Preterm AB SAB TAB Ectopic Multiple Living  2 2 2       2     # Outcome Date GA Lbr Len/2nd Weight Sex Delivery Anes PTL Lv  2 Term 01/22/14 [redacted]w[redacted]d  8 lb 8.5 oz (3.87 kg) F CS-LTranv Spinal  Y  1 Term 2007    M CS-LTranv   Y    Patient denies any other pertinent gynecologic issues.   No current facility-administered medications on file prior to encounter.   Current Outpatient Prescriptions on File Prior to Encounter  Medication Sig Dispense Refill  . azithromycin (ZITHROMAX) 500 MG tablet Take 2 tablets (1,000 mg total) by mouth once. (Patient not taking: Reported on 02/26/2015) 2 tablet 1  . ibuprofen (ADVIL,MOTRIN) 800 MG tablet Take 1 tablet (800 mg total) by mouth 3 (three) times daily with meals as needed for moderate pain or cramping. 60 tablet 3  . tranexamic acid (LYSTEDA) 650 MG TABS tablet Take 2 tablets (1,300 mg total) by mouth 3 (three) times daily. Take during menses for a maximum of five days (Patient not taking: Reported on 02/26/2015) 30 tablet 2   Allergies  Allergen Reactions  . Peanuts [Peanut Oil] Anaphylaxis  .  Cinnamon Swelling    Social History:   reports that she quit smoking about 18 months ago. Her smoking use included Cigarettes. She has a 3 pack-year smoking history. She has never used smokeless tobacco. She reports that she drinks alcohol. She reports that she does not use illicit drugs.  Family History  Problem Relation Age of Onset  . COPD Maternal Grandmother   . Birth defects Neg Hx   . Asthma Neg Hx   . Arthritis Neg Hx   . Alcohol abuse Neg Hx   . Drug abuse Neg Hx   . Diabetes Neg Hx   . Depression Neg Hx   . Early death Neg Hx   . Hearing loss Neg Hx   . Heart disease Neg Hx   . Hyperlipidemia Neg Hx   . Hypertension Neg Hx   . Learning disabilities Neg Hx   . Kidney disease Neg Hx   . Mental illness Neg Hx   . Mental retardation Neg Hx   . Miscarriages / Stillbirths Neg Hx   . Stroke Neg Hx   . Vision loss Neg Hx   . Cancer Sister 25    lumpectomy for breast cancer  . Cancer Sister   . Cancer Sister     Review of Systems: Noncontributory  PHYSICAL EXAM:  Blood pressure 139/81, pulse 87, temperature 98.2 F (36.8 C), temperature source Oral, resp. rate 20, height 5\' 6"  (1.676 m), weight 224 lb (101.606 kg), last menstrual period 02/14/2015, SpO2 99 %, not currently breastfeeding. CONSTITUTIONAL: Well-developed, well-nourished female in no acute distress.  HENT:  Normocephalic, atraumatic, External right and left ear normal. Oropharynx is clear and moist EYES: Conjunctivae and EOM are normal. Pupils are equal, round, and reactive to light. No scleral icterus.  NECK: Normal range of motion, supple, no masses SKIN: Skin is warm and dry. No rash noted. Not diaphoretic. No erythema. No pallor. [place]: Alert and oriented to person, place, and time. Normal reflexes, muscle tone coordination. No cranial nerve deficit noted. PSYCHIATRIC: Normal mood and affect. Normal behavior. Normal judgment and thought content. CARDIOVASCULAR: Normal heart rate noted, regular  rhythm RESPIRATORY: Effort and breath sounds normal, no problems with respiration noted ABDOMEN: Soft, nontender, nondistended. PELVIC: Deferred MUSCULOSKELETAL: Normal range of motion. No edema and no tenderness. 2+ distal pulses.  Labs: Results for orders placed or performed during the hospital encounter of 03/09/15 (from the past 336 hour(s))  CBC   Collection Time: 03/09/15  1:45 PM  Result Value Ref Range   WBC 12.0 (H) 4.0 - 10.5 K/uL   RBC 4.69 3.87 - 5.11 MIL/uL   Hemoglobin 13.7 12.0 - 15.0 g/dL   HCT 40.0 36.0 - 46.0 %   MCV 85.3 78.0 - 100.0 fL   MCH 29.2 26.0 - 34.0 pg   MCHC 34.3 30.0 - 36.0 g/dL   RDW 13.4 11.5 - 15.5 %   Platelets 225 150 - 400 K/uL  Pregnancy, urine   Collection Time: 03/09/15  2:00 PM  Result Value Ref Range   Preg Test, Ur NEGATIVE NEGATIVE    Imaging Studies:  01/21/2015   CLINICAL DATA:  Initial encounter for dysmenorrhea  EXAM: TRANSABDOMINAL AND TRANSVAGINAL ULTRASOUND OF PELVIS  TECHNIQUE: Both transabdominal and transvaginal ultrasound examinations of the pelvis were performed. Transabdominal technique was performed for global imaging of the pelvis including uterus, ovaries, adnexal regions, and pelvic cul-de-sac. It was necessary to proceed with endovaginal exam following the transabdominal exam to visualize the endometrium.  COMPARISON:  None  FINDINGS: Uterus  Measurements: 8.7 x 4.3 x 5.7 cm. No fibroids or other mass visualized.  Endometrium  Thickness: 8 mm. Small amount of fluid is seen in the endometrial canal in this patient who reports ongoing menses at this time. No evidence for focal endometrial abnormality.  Right ovary  Measurements: 3.7 x 3.3 x 2.2 cm. Normal appearance/no adnexal mass.  Left ovary  Measurements: 3.9 x 1.7 x 2.1 cm. Normal appearance/no adnexal mass.  Other findings  No free fluid.  IMPRESSION: Unremarkable pelvic ultrasound.   Electronically Signed   By: Misty Stanley M.D.   On: 01/21/2015 14:29     Assessment: Patient Active Problem List   Diagnosis Date Noted  . Menorrhagia 03/09/2015  . FH: breast cancer in sister diagnosed at age 79 01/12/2015  . Chlamydia infection 01/12/2015  . Muscle spasm 09/22/2013  . Migraine without aura 08/18/2013  . Anxiety 08/18/2013  . Marijuana use 08/18/2013  . Bipolar disorder 06/25/2013    Plan: Patient will undergo surgical management with hysteroscopic hydrothermal endometrial ablation.   Risks of surgery were discussed with the patient including but not limited to: bleeding; infection which may require antibiotics; injury to uterus leading to risk of injury to surrounding intraperitoneal organs, burn injury to vagina or other organs, need for additional procedures  including laparoscopy or laparotomy, inability to complete ablation due to uterine or mechanical anomaly, and other postoperative/anesthesia complications.  Patient was informed that there is a high likelihood of success of controlling her symptoms; however about 5% of patients may require further intervention.  Routine postoperative instructions will be reviewed with the patient and her family in detail after surgery.  The patient concurred with the proposed plan, giving informed written consent for the surgery.  Patient has been NPO since last night she will remain NPO for procedure.  Anesthesia and OR aware.  Preoperative prophylactic antibiotics and SCDs ordered on call to the OR.  To OR when ready.  Verita Schneiders, M.D. 03/09/2015 3:32 PM

## 2015-03-09 NOTE — Anesthesia Postprocedure Evaluation (Signed)
  Anesthesia Post-op Note  Patient: Joan Heath  Procedure(s) Performed: Procedure(s): HYSTEROSCOPY WITH HYDROTHERMAL ABLATION (N/A)  Patient Location: PACU  Anesthesia Type:General  Level of Consciousness: awake and alert   Airway and Oxygen Therapy: Patient Spontanous Breathing  Post-op Pain: mild  Post-op Assessment: Post-op Vital signs reviewed, Patient's Cardiovascular Status Stable, Respiratory Function Stable, Patent Airway and No signs of Nausea or vomiting  Post-op Vital Signs: Reviewed and stable  Last Vitals:  Filed Vitals:   03/09/15 1651  BP: 130/78  Pulse: 129  Temp: 36.8 C  Resp: 28    Complications: No apparent anesthesia complications

## 2015-03-09 NOTE — Discharge Instructions (Addendum)
DISCHARGE INSTRUCTIONS: HYSTEROSCOPY / ENDOMETRIAL ABLATION The following instructions have been prepared to help you care for yourself upon your return home.  May Remove Scop patch on or before 03/11/2015  May take Ibuprofen after  9:30 p.m. As needed for cramps/pain  May take stool softner while taking narcotic pain medication to prevent constipation.  Drink plenty of water.  Personal hygiene:  Use sanitary pads for vaginal drainage, not tampons.  Shower the day after your procedure.  NO tub baths, pools or Jacuzzis for 2-3 weeks.  Wipe front to back after using the bathroom.  Activity and limitations:  Do NOT drive or operate any equipment for 24 hours. The effects of anesthesia are still present and drowsiness may result.  Do NOT rest in bed all day.  Walking is encouraged.  Walk up and down stairs slowly.  You may resume your normal activity in one to two days or as indicated by your physician.  Sexual activity: NO intercourse for at least 2 weeks after the procedure, or as indicated by your Doctor.  Diet: Eat a light meal as desired this evening. You may resume your usual diet tomorrow.  Return to Work: You may resume your work activities in one to two days or as indicated by Marine scientist.  What to expect after your surgery: Expect to have vaginal bleeding/discharge for 2-3 days and spotting for up to 10 days. It is not unusual to have soreness for up to 1-2 weeks. You may have a slight burning sensation when you urinate for the first day. Mild cramps may continue for a couple of days. You may have a regular period in 2-6 weeks.  Call your doctor for any of the following:  Excessive vaginal bleeding or clotting, saturating and changing one pad every hour.  Inability to urinate 6 hours after discharge from hospital.  Pain not relieved by pain medication.  Fever of 100.4 F or greater.  Unusual vaginal discharge or odor.       Endometrial Ablation, Care  After Endometrial ablation removes the lining of the uterus (endometrium). It is usually a same-day, outpatient treatment. Ablation helps avoid major surgery, such as surgery to remove the cervix and uterus (hysterectomy). After endometrial ablation, you will have little or no menstrual bleeding and may not be able to have children. However, if you are premenopausal, you will need to use a reliable method of birth control following the procedure because of the small chance that pregnancy can occur. There are different reasons to have this procedure, which include:  Heavy periods.  Bleeding that is causing anemia.  Irregular bleeding.  Bleeding fibroids on the lining inside the uterus if they are smaller than 3 centimeters. AFTER THE PROCEDURE  After your procedure, do not have sexual intercourse or insert anything into your vagina for three weeks. After the procedure, you may experience:  Cramps.  Vaginal discharge.  Frequent urination.    WHAT TO EXPECT AFTER THE PROCEDURE After your procedure, it is typical to have light cramping and bleeding. This may last for 2 days to 2 weeks after the procedure.  HOME CARE INSTRUCTIONS   Do not drive for 24 hours.  Wait 1 week before returning to strenuous activities.  Take your temperature daily for 4 days and write it down. Provide these temperatures to your health care provider if you develop a fever.  Avoid long periods of standing.  Take rest periods often.  You may resume your usual diet.  Drink enough fluids  to keep your urine clear or pale yellow.  Your usual bowel function should return. If you have constipation, you may:  Take a mild laxative with permission from your health care provider.  Add fruit and bran to your diet.  Drink more fluids.  Take showers instead of baths until your health care provider gives you permission to take baths.  Do not go swimming or use a hot tub until your health care provider  approves.  Try to have someone with you or available to you the first 24-48 hours, especially if you were given a general anesthetic.  Do not douche, use tampons, or have sex (intercourse) for 3 weeks after the procedure.  Only take over-the-counter or prescription medicines as directed by your health care provider. Do not take aspirin. It can cause bleeding.  Follow up with your health care provider as directed. SEEK MEDICAL CARE IF:   You have increasing cramps or pain that is not relieved with medicine.  You have abdominal pain that does not seem to be related to the same area of earlier cramping and pain.  You have bad smelling vaginal discharge.  You have a rash.  You are having problems with any medicine. SEEK IMMEDIATE MEDICAL CARE IF:   You have bleeding that is heavier than a normal menstrual period.  You have a fever.  You have chest pain.  You have shortness of breath.  You feel dizzy or feel like fainting.  You pass out.  You have heavy vaginal bleeding with or without blood clots. MAKE SURE YOU:   Understand these instructions.  Will watch your condition.  Will get help right away if you are not doing well or get worse. Document Released: 09/28/2000 Document Revised: 10/06/2013 Document Reviewed: 04/30/2013 [facility] Patient Information 2015 [place], [place]. This information is not intended to replace advice given to you by your health care provider. Make sure you discuss any questions you have with your health care provider.

## 2015-03-09 NOTE — Anesthesia Procedure Notes (Signed)
Procedure Name: LMA Insertion Date/Time: 03/09/2015 3:53 PM Performed by: Jonna Munro Pre-anesthesia Checklist: Patient identified, Emergency Drugs available, Suction available, Patient being monitored and Timeout performed Patient Re-evaluated:Patient Re-evaluated prior to inductionOxygen Delivery Method: Circle system utilized Preoxygenation: Pre-oxygenation with 100% oxygen Intubation Type: IV induction Ventilation: Mask ventilation without difficulty LMA: LMA inserted LMA Size: 4.0 Number of attempts: 1 Dental Injury: Teeth and Oropharynx as per pre-operative assessment

## 2015-03-09 NOTE — Transfer of Care (Signed)
Immediate Anesthesia Transfer of Care Note  Patient: Joan Heath  Procedure(s) Performed: Procedure(s): HYSTEROSCOPY WITH HYDROTHERMAL ABLATION (N/A)  Patient Location: PACU  Anesthesia Type:General  Level of Consciousness: awake, alert  and oriented  Airway & Oxygen Therapy: Patient Spontanous Breathing and Patient connected to nasal cannula oxygen  Post-op Assessment: Report given to RN and Post -op Vital signs reviewed and stable  Post vital signs: Reviewed and stable  Last Vitals:  Filed Vitals:   03/09/15 1354  BP: 139/81  Pulse:   Temp:   Resp:     Complications: No apparent anesthesia complications

## 2015-03-10 ENCOUNTER — Encounter (HOSPITAL_COMMUNITY): Payer: Self-pay | Admitting: Obstetrics & Gynecology

## 2015-04-07 ENCOUNTER — Ambulatory Visit
Admission: RE | Admit: 2015-04-07 | Discharge: 2015-04-07 | Disposition: A | Discharge status: Home / Self Care | Payer: Medicaid Other | Source: Ambulatory Visit | Attending: Obstetrics & Gynecology | Admitting: Obstetrics & Gynecology

## 2015-04-07 ENCOUNTER — Other Ambulatory Visit: Payer: Medicaid Other

## 2015-04-07 DIAGNOSIS — Z803 Family history of malignant neoplasm of breast: Secondary | ICD-10-CM

## 2015-04-07 MED ORDER — GADOBENATE DIMEGLUMINE 529 MG/ML IV SOLN
20.0000 mL | Freq: Once | INTRAVENOUS | Status: AC | PRN
  Administered 2015-04-07: 20 mL via INTRAVENOUS

## 2015-04-11 ENCOUNTER — Other Ambulatory Visit: Payer: Self-pay | Admitting: Obstetrics & Gynecology

## 2015-04-11 DIAGNOSIS — N649 Disorder of breast, unspecified: Secondary | ICD-10-CM

## 2015-04-11 DIAGNOSIS — Z803 Family history of malignant neoplasm of breast: Secondary | ICD-10-CM

## 2015-04-20 ENCOUNTER — Ambulatory Visit: Payer: Medicaid Other | Admitting: Obstetrics & Gynecology

## 2015-04-27 ENCOUNTER — Other Ambulatory Visit: Payer: Self-pay | Admitting: Obstetrics & Gynecology

## 2015-04-27 DIAGNOSIS — R928 Other abnormal and inconclusive findings on diagnostic imaging of breast: Secondary | ICD-10-CM

## 2015-04-27 DIAGNOSIS — Z803 Family history of malignant neoplasm of breast: Secondary | ICD-10-CM

## 2015-04-27 DIAGNOSIS — N649 Disorder of breast, unspecified: Secondary | ICD-10-CM

## 2015-04-28 ENCOUNTER — Other Ambulatory Visit: Payer: Self-pay | Admitting: Obstetrics & Gynecology

## 2015-04-28 ENCOUNTER — Ambulatory Visit
Admission: RE | Admit: 2015-04-28 | Discharge: 2015-04-28 | Disposition: A | Discharge status: Home / Self Care | Payer: Medicaid Other | Source: Ambulatory Visit | Attending: Obstetrics & Gynecology | Admitting: Obstetrics & Gynecology

## 2015-04-28 ENCOUNTER — Encounter: Payer: Self-pay | Admitting: Obstetrics & Gynecology

## 2015-04-28 ENCOUNTER — Ambulatory Visit (INDEPENDENT_AMBULATORY_CARE_PROVIDER_SITE_OTHER): Payer: Medicaid Other | Admitting: Obstetrics & Gynecology

## 2015-04-28 VITALS — BP 104/72 | HR 80 | Wt 211.0 lb

## 2015-04-28 DIAGNOSIS — Z803 Family history of malignant neoplasm of breast: Secondary | ICD-10-CM

## 2015-04-28 DIAGNOSIS — R928 Other abnormal and inconclusive findings on diagnostic imaging of breast: Secondary | ICD-10-CM

## 2015-04-28 DIAGNOSIS — Z9889 Other specified postprocedural states: Secondary | ICD-10-CM

## 2015-04-28 DIAGNOSIS — N649 Disorder of breast, unspecified: Secondary | ICD-10-CM

## 2015-04-28 DIAGNOSIS — Z09 Encounter for follow-up examination after completed treatment for conditions other than malignant neoplasm: Secondary | ICD-10-CM

## 2015-04-28 NOTE — Progress Notes (Signed)
  Subjective:     Joan Heath is a 29 y.o. G31P2002 female who presents to the clinic status post hysteroscopy, hydrothermal endometrial ablation on 03/09/15.  No bleeding since 4 weeks after surgery.  No postoperative complications.  Eating a regular diet without difficulty. Bowel movements are normal. The patient is not having any pain.  The following portions of the patient's history were reviewed and updated as appropriate: allergies, current medications, past family history, past medical history, past social history, past surgical history and problem list. Normal pap in 01/12/15.  Review of Systems Behavioral/Psych: positive for stress about other issues    Objective:    BP 104/72 mmHg  Pulse 80  Wt 211 lb (95.709 kg)  Breastfeeding? No General:  alert and no distress  Abdomen: soft, bowel sounds active, non-tender  Pelvic:   deferred     Assessment:    Doing well postoperatively.   Plan:   1. Continue any current medications. 2. Activity restrictions: none 3. Follow up: as needed  Verita Schneiders, MD, [place] Attending [place] for [place], [place]

## 2015-04-28 NOTE — Patient Instructions (Signed)
Preventive Care for Adults A healthy lifestyle and preventive care can promote health and wellness. Preventive health guidelines for women include the following key practices.  A routine yearly physical is a good way to check with your health care provider about your health and preventive screening. It is a chance to share any concerns and updates on your health and to receive a thorough exam.  Visit your dentist for a routine exam and preventive care every 6 months. Brush your teeth twice a day and floss once a day. Good oral hygiene prevents tooth decay and gum disease.  The frequency of eye exams is based on your age, health, family medical history, use of contact lenses, and other factors. Follow your health care provider's recommendations for frequency of eye exams.  Eat a healthy diet. Foods like vegetables, fruits, whole grains, low-fat dairy products, and lean protein foods contain the nutrients you need without too many calories. Decrease your intake of foods high in solid fats, added sugars, and salt. Eat the right amount of calories for you.Get information about a proper diet from your health care provider, if necessary.  Regular physical exercise is one of the most important things you can do for your health. Most adults should get at least 150 minutes of moderate-intensity exercise (any activity that increases your heart rate and causes you to sweat) each week. In addition, most adults need muscle-strengthening exercises on 2 or more days a week.  Maintain a healthy weight. The body mass index (BMI) is a screening tool to identify possible weight problems. It provides an estimate of body fat based on height and weight. Your health care provider can find your BMI and can help you achieve or maintain a healthy weight.For adults 20 years and older:  A BMI below 18.5 is considered underweight.  A BMI of 18.5 to 24.9 is normal.  A BMI of 25 to 29.9 is considered overweight.  A BMI of  30 and above is considered obese.  Maintain normal blood lipids and cholesterol levels by exercising and minimizing your intake of saturated fat. Eat a balanced diet with plenty of fruit and vegetables. Blood tests for lipids and cholesterol should begin at age 20 and be repeated every 5 years. If your lipid or cholesterol levels are high, you are over 50, or you are at high risk for heart disease, you may need your cholesterol levels checked more frequently.Ongoing high lipid and cholesterol levels should be treated with medicines if diet and exercise are not working.  If you smoke, find out from your health care provider how to quit. If you do not use tobacco, do not start.  Lung cancer screening is recommended for adults aged 55-80 years who are at high risk for developing lung cancer because of a history of smoking. A yearly low-dose CT scan of the lungs is recommended for people who have at least a 30-pack-year history of smoking and are a current smoker or have quit within the past 15 years. A pack year of smoking is smoking an average of 1 pack of cigarettes a day for 1 year (for example: 1 pack a day for 30 years or 2 packs a day for 15 years). Yearly screening should continue until the smoker has stopped smoking for at least 15 years. Yearly screening should be stopped for people who develop a health problem that would prevent them from having lung cancer treatment.  If you are pregnant, do not drink alcohol. If you are breastfeeding,   be very cautious about drinking alcohol. If you are not pregnant and choose to drink alcohol, do not have more than 1 drink per day. One drink is considered to be 12 ounces (355 mL) of beer, 5 ounces (148 mL) of wine, or 1.5 ounces (44 mL) of liquor.  Avoid use of street drugs. Do not share needles with anyone. Ask for help if you need support or instructions about stopping the use of drugs.  High blood pressure causes heart disease and increases the risk of  stroke. Your blood pressure should be checked at least every 1 to 2 years. Ongoing high blood pressure should be treated with medicines if weight loss and exercise do not work.  If you are 3-86 years old, ask your health care provider if you should take aspirin to prevent strokes.  Diabetes screening involves taking a blood sample to check your fasting blood sugar level. This should be done once every 3 years, after age 67, if you are within normal weight and without risk factors for diabetes. Testing should be considered at a younger age or be carried out more frequently if you are overweight and have at least 1 risk factor for diabetes.  Breast cancer screening is essential preventive care for women. You should practice "breast self-awareness." This means understanding the normal appearance and feel of your breasts and may include breast self-examination. Any changes detected, no matter how small, should be reported to a health care provider. Women in their 8s and 30s should have a clinical breast exam (CBE) by a health care provider as part of a regular health exam every 1 to 3 years. After age 70, women should have a CBE every year. Starting at age 25, women should consider having a mammogram (breast X-ray test) every year. Women who have a family history of breast cancer should talk to their health care provider about genetic screening. Women at a high risk of breast cancer should talk to their health care providers about having an MRI and a mammogram every year.  Breast cancer gene (BRCA)-related cancer risk assessment is recommended for women who have family members with BRCA-related cancers. BRCA-related cancers include breast, ovarian, tubal, and peritoneal cancers. Having family members with these cancers may be associated with an increased risk for harmful changes (mutations) in the breast cancer genes BRCA1 and BRCA2. Results of the assessment will determine the need for genetic counseling and  BRCA1 and BRCA2 testing.  Routine pelvic exams to screen for cancer are no longer recommended for nonpregnant women who are considered low risk for cancer of the pelvic organs (ovaries, uterus, and vagina) and who do not have symptoms. Ask your health care provider if a screening pelvic exam is right for you.  If you have had past treatment for cervical cancer or a condition that could lead to cancer, you need Pap tests and screening for cancer for at least 20 years after your treatment. If Pap tests have been discontinued, your risk factors (such as having a new sexual partner) need to be reassessed to determine if screening should be resumed. Some women have medical problems that increase the chance of getting cervical cancer. In these cases, your health care provider may recommend more frequent screening and Pap tests.  The HPV test is an additional test that may be used for cervical cancer screening. The HPV test looks for the virus that can cause the cell changes on the cervix. The cells collected during the Pap test can be  tested for HPV. The HPV test could be used to screen women aged 30 years and older, and should be used in women of any age who have unclear Pap test results. After the age of 30, women should have HPV testing at the same frequency as a Pap test.  Colorectal cancer can be detected and often prevented. Most routine colorectal cancer screening begins at the age of 50 years and continues through age 75 years. However, your health care provider may recommend screening at an earlier age if you have risk factors for colon cancer. On a yearly basis, your health care provider may provide home test kits to check for hidden blood in the stool. Use of a small camera at the end of a tube, to directly examine the colon (sigmoidoscopy or colonoscopy), can detect the earliest forms of colorectal cancer. Talk to your health care provider about this at age 50, when routine screening begins. Direct  exam of the colon should be repeated every 5-10 years through age 75 years, unless early forms of pre-cancerous polyps or small growths are found.  People who are at an increased risk for hepatitis B should be screened for this virus. You are considered at high risk for hepatitis B if:  You were born in a country where hepatitis B occurs often. Talk with your health care provider about which countries are considered high risk.  Your parents were born in a high-risk country and you have not received a shot to protect against hepatitis B (hepatitis B vaccine).  You have HIV or AIDS.  You use needles to inject street drugs.  You live with, or have sex with, someone who has hepatitis B.  You get hemodialysis treatment.  You take certain medicines for conditions like cancer, organ transplantation, and autoimmune conditions.  Hepatitis C blood testing is recommended for all people born from 1945 through 1965 and any individual with known risks for hepatitis C.  Practice safe sex. Use condoms and avoid high-risk sexual practices to reduce the spread of sexually transmitted infections (STIs). STIs include gonorrhea, chlamydia, syphilis, trichomonas, herpes, HPV, and human immunodeficiency virus (HIV). Herpes, HIV, and HPV are viral illnesses that have no cure. They can result in disability, cancer, and death.  You should be screened for sexually transmitted illnesses (STIs) including gonorrhea and chlamydia if:  You are sexually active and are younger than 24 years.  You are older than 24 years and your health care provider tells you that you are at risk for this type of infection.  Your sexual activity has changed since you were last screened and you are at an increased risk for chlamydia or gonorrhea. Ask your health care provider if you are at risk.  If you are at risk of being infected with HIV, it is recommended that you take a prescription medicine daily to prevent HIV infection. This is  called preexposure prophylaxis (PrEP). You are considered at risk if:  You are a heterosexual woman, are sexually active, and are at increased risk for HIV infection.  You take drugs by injection.  You are sexually active with a partner who has HIV.  Talk with your health care provider about whether you are at high risk of being infected with HIV. If you choose to begin PrEP, you should first be tested for HIV. You should then be tested every 3 months for as long as you are taking PrEP.  Osteoporosis is a disease in which the bones lose minerals and strength   with aging. This can result in serious bone fractures or breaks. The risk of osteoporosis can be identified using a bone density scan. Women ages 65 years and over and women at risk for fractures or osteoporosis should discuss screening with their health care providers. Ask your health care provider whether you should take a calcium supplement or vitamin D to reduce the rate of osteoporosis.  Menopause can be associated with physical symptoms and risks. Hormone replacement therapy is available to decrease symptoms and risks. You should talk to your health care provider about whether hormone replacement therapy is right for you.  Use sunscreen. Apply sunscreen liberally and repeatedly throughout the day. You should seek shade when your shadow is shorter than you. Protect yourself by wearing long sleeves, pants, a wide-brimmed hat, and sunglasses year round, whenever you are outdoors.  Once a month, do a whole body skin exam, using a mirror to look at the skin on your back. Tell your health care provider of new moles, moles that have irregular borders, moles that are larger than a pencil eraser, or moles that have changed in shape or color.  Stay current with required vaccines (immunizations).  Influenza vaccine. All adults should be immunized every year.  Tetanus, diphtheria, and acellular pertussis (Td, Tdap) vaccine. Pregnant women should  receive 1 dose of Tdap vaccine during each pregnancy. The dose should be obtained regardless of the length of time since the last dose. Immunization is preferred during the 27th-36th week of gestation. An adult who has not previously received Tdap or who does not know her vaccine status should receive 1 dose of Tdap. This initial dose should be followed by tetanus and diphtheria toxoids (Td) booster doses every 10 years. Adults with an unknown or incomplete history of completing a 3-dose immunization series with Td-containing vaccines should begin or complete a primary immunization series including a Tdap dose. Adults should receive a Td booster every 10 years.  Varicella vaccine. An adult without evidence of immunity to varicella should receive 2 doses or a second dose if she has previously received 1 dose. Pregnant females who do not have evidence of immunity should receive the first dose after pregnancy. This first dose should be obtained before leaving the health care facility. The second dose should be obtained 4-8 weeks after the first dose.  Human papillomavirus (HPV) vaccine. Females aged 13-26 years who have not received the vaccine previously should obtain the 3-dose series. The vaccine is not recommended for use in pregnant females. However, pregnancy testing is not needed before receiving a dose. If a female is found to be pregnant after receiving a dose, no treatment is needed. In that case, the remaining doses should be delayed until after the pregnancy. Immunization is recommended for any person with an immunocompromised condition through the age of 26 years if she did not get any or all doses earlier. During the 3-dose series, the second dose should be obtained 4-8 weeks after the first dose. The third dose should be obtained 24 weeks after the first dose and 16 weeks after the second dose.  Zoster vaccine. One dose is recommended for adults aged 60 years or older unless certain conditions are  present.  Measles, mumps, and rubella (MMR) vaccine. Adults born before 1957 generally are considered immune to measles and mumps. Adults born in 1957 or later should have 1 or more doses of MMR vaccine unless there is a contraindication to the vaccine or there is laboratory evidence of immunity to   each of the three diseases. A routine second dose of MMR vaccine should be obtained at least 28 days after the first dose for students attending postsecondary schools, health care workers, or international travelers. People who received inactivated measles vaccine or an unknown type of measles vaccine during 1963-1967 should receive 2 doses of MMR vaccine. People who received inactivated mumps vaccine or an unknown type of mumps vaccine before 1979 and are at high risk for mumps infection should consider immunization with 2 doses of MMR vaccine. For females of childbearing age, rubella immunity should be determined. If there is no evidence of immunity, females who are not pregnant should be vaccinated. If there is no evidence of immunity, females who are pregnant should delay immunization until after pregnancy. Unvaccinated health care workers born before 1957 who lack laboratory evidence of measles, mumps, or rubella immunity or laboratory confirmation of disease should consider measles and mumps immunization with 2 doses of MMR vaccine or rubella immunization with 1 dose of MMR vaccine.  Pneumococcal 13-valent conjugate (PCV13) vaccine. When indicated, a person who is uncertain of her immunization history and has no record of immunization should receive the PCV13 vaccine. An adult aged 19 years or older who has certain medical conditions and has not been previously immunized should receive 1 dose of PCV13 vaccine. This PCV13 should be followed with a dose of pneumococcal polysaccharide (PPSV23) vaccine. The PPSV23 vaccine dose should be obtained at least 8 weeks after the dose of PCV13 vaccine. An adult aged 19  years or older who has certain medical conditions and previously received 1 or more doses of PPSV23 vaccine should receive 1 dose of PCV13. The PCV13 vaccine dose should be obtained 1 or more years after the last PPSV23 vaccine dose.  Pneumococcal polysaccharide (PPSV23) vaccine. When PCV13 is also indicated, PCV13 should be obtained first. All adults aged 65 years and older should be immunized. An adult younger than age 65 years who has certain medical conditions should be immunized. Any person who resides in a nursing home or long-term care facility should be immunized. An adult smoker should be immunized. People with an immunocompromised condition and certain other conditions should receive both PCV13 and PPSV23 vaccines. People with human immunodeficiency virus (HIV) infection should be immunized as soon as possible after diagnosis. Immunization during chemotherapy or radiation therapy should be avoided. Routine use of PPSV23 vaccine is not recommended for American Indians, [place] Natives, or people younger than 65 years unless there are medical conditions that require PPSV23 vaccine. When indicated, people who have unknown immunization and have no record of immunization should receive PPSV23 vaccine. One-time revaccination 5 years after the first dose of PPSV23 is recommended for people aged 19-64 years who have chronic kidney failure, nephrotic syndrome, asplenia, or immunocompromised conditions. People who received 1-2 doses of PPSV23 before age 65 years should receive another dose of PPSV23 vaccine at age 65 years or later if at least 5 years have passed since the previous dose. Doses of PPSV23 are not needed for people immunized with PPSV23 at or after age 65 years.  Meningococcal vaccine. Adults with asplenia or persistent complement component deficiencies should receive 2 doses of quadrivalent meningococcal conjugate (MenACWY-D) vaccine. The doses should be obtained at least 2 months apart.  Microbiologists working with certain meningococcal bacteria, military recruits, people at risk during an outbreak, and people who travel to or live in countries with a high rate of meningitis should be immunized. A first-year college student up through age   21 years who is living in a residence hall should receive a dose if she did not receive a dose on or after her 16th birthday. Adults who have certain high-risk conditions should receive one or more doses of vaccine.  Hepatitis A vaccine. Adults who wish to be protected from this disease, have certain high-risk conditions, work with hepatitis A-infected animals, work in hepatitis A research labs, or travel to or work in countries with a high rate of hepatitis A should be immunized. Adults who were previously unvaccinated and who anticipate close contact with an international adoptee during the first 60 days after arrival in the [place] from a country with a high rate of hepatitis A should be immunized.  Hepatitis B vaccine. Adults who wish to be protected from this disease, have certain high-risk conditions, may be exposed to blood or other infectious body fluids, are household contacts or sex partners of hepatitis B positive people, are clients or workers in certain care facilities, or travel to or work in countries with a high rate of hepatitis B should be immunized.  Haemophilus influenzae type b (Hib) vaccine. A previously unvaccinated person with asplenia or sickle cell disease or having a scheduled splenectomy should receive 1 dose of Hib vaccine. Regardless of previous immunization, a recipient of a hematopoietic stem cell transplant should receive a 3-dose series 6-12 months after her successful transplant. Hib vaccine is not recommended for adults with HIV infection. Preventive Services / Frequency Ages 64 to 68 years  Blood pressure check.** / Every 1 to 2 years.  Lipid and cholesterol check.** / Every 5 years beginning at age  22.  Clinical breast exam.** / Every 3 years for women in their 88s and 53s.  BRCA-related cancer risk assessment.** / For women who have family members with a BRCA-related cancer (breast, ovarian, tubal, or peritoneal cancers).  Pap test.** / Every 2 years from ages 90 through 51. Every 3 years starting at age 21 through age 56 or 3 with a history of 3 consecutive normal Pap tests.  HPV screening.** / Every 3 years from ages 24 through ages 1 to 46 with a history of 3 consecutive normal Pap tests.  Hepatitis C blood test.** / For any individual with known risks for hepatitis C.  Skin self-exam. / Monthly.  Influenza vaccine. / Every year.  Tetanus, diphtheria, and acellular pertussis (Tdap, Td) vaccine.** / Consult your health care provider. Pregnant women should receive 1 dose of Tdap vaccine during each pregnancy. 1 dose of Td every 10 years.  Varicella vaccine.** / Consult your health care provider. Pregnant females who do not have evidence of immunity should receive the first dose after pregnancy.  HPV vaccine. / 3 doses over 6 months, if 72 and younger. The vaccine is not recommended for use in pregnant females. However, pregnancy testing is not needed before receiving a dose.  Measles, mumps, rubella (MMR) vaccine.** / You need at least 1 dose of MMR if you were born in 1957 or later. You may also need a 2nd dose. For females of childbearing age, rubella immunity should be determined. If there is no evidence of immunity, females who are not pregnant should be vaccinated. If there is no evidence of immunity, females who are pregnant should delay immunization until after pregnancy.  Pneumococcal 13-valent conjugate (PCV13) vaccine.** / Consult your health care provider.  Pneumococcal polysaccharide (PPSV23) vaccine.** / 1 to 2 doses if you smoke cigarettes or if you have certain conditions.  Meningococcal vaccine.** /  1 dose if you are age 19 to 21 years and a first-year college  student living in a residence hall, or have one of several medical conditions, you need to get vaccinated against meningococcal disease. You may also need additional booster doses.  Hepatitis A vaccine.** / Consult your health care provider.  Hepatitis B vaccine.** / Consult your health care provider.  Haemophilus influenzae type b (Hib) vaccine.** / Consult your health care provider. Ages 40 to 64 years  Blood pressure check.** / Every 1 to 2 years.  Lipid and cholesterol check.** / Every 5 years beginning at age 20 years.  Lung cancer screening. / Every year if you are aged 55-80 years and have a 30-pack-year history of smoking and currently smoke or have quit within the past 15 years. Yearly screening is stopped once you have quit smoking for at least 15 years or develop a health problem that would prevent you from having lung cancer treatment.  Clinical breast exam.** / Every year after age 40 years.  BRCA-related cancer risk assessment.** / For women who have family members with a BRCA-related cancer (breast, ovarian, tubal, or peritoneal cancers).  Mammogram.** / Every year beginning at age 40 years and continuing for as long as you are in good health. Consult with your health care provider.  Pap test.** / Every 3 years starting at age 30 years through age 65 or 70 years with a history of 3 consecutive normal Pap tests.  HPV screening.** / Every 3 years from ages 30 years through ages 65 to 70 years with a history of 3 consecutive normal Pap tests.  Fecal occult blood test (FOBT) of stool. / Every year beginning at age 50 years and continuing until age 75 years. You may not need to do this test if you get a colonoscopy every 10 years.  Flexible sigmoidoscopy or colonoscopy.** / Every 5 years for a flexible sigmoidoscopy or every 10 years for a colonoscopy beginning at age 50 years and continuing until age 75 years.  Hepatitis C blood test.** / For all people born from 1945 through  1965 and any individual with known risks for hepatitis C.  Skin self-exam. / Monthly.  Influenza vaccine. / Every year.  Tetanus, diphtheria, and acellular pertussis (Tdap/Td) vaccine.** / Consult your health care provider. Pregnant women should receive 1 dose of Tdap vaccine during each pregnancy. 1 dose of Td every 10 years.  Varicella vaccine.** / Consult your health care provider. Pregnant females who do not have evidence of immunity should receive the first dose after pregnancy.  Zoster vaccine.** / 1 dose for adults aged 60 years or older.  Measles, mumps, rubella (MMR) vaccine.** / You need at least 1 dose of MMR if you were born in 1957 or later. You may also need a 2nd dose. For females of childbearing age, rubella immunity should be determined. If there is no evidence of immunity, females who are not pregnant should be vaccinated. If there is no evidence of immunity, females who are pregnant should delay immunization until after pregnancy.  Pneumococcal 13-valent conjugate (PCV13) vaccine.** / Consult your health care provider.  Pneumococcal polysaccharide (PPSV23) vaccine.** / 1 to 2 doses if you smoke cigarettes or if you have certain conditions.  Meningococcal vaccine.** / Consult your health care provider.  Hepatitis A vaccine.** / Consult your health care provider.  Hepatitis B vaccine.** / Consult your health care provider.  Haemophilus influenzae type b (Hib) vaccine.** / Consult your health care provider. Ages 65   years and over  Blood pressure check.** / Every 1 to 2 years.  Lipid and cholesterol check.** / Every 5 years beginning at age 22 years.  Lung cancer screening. / Every year if you are aged 73-80 years and have a 30-pack-year history of smoking and currently smoke or have quit within the past 15 years. Yearly screening is stopped once you have quit smoking for at least 15 years or develop a health problem that would prevent you from having lung cancer  treatment.  Clinical breast exam.** / Every year after age 4 years.  BRCA-related cancer risk assessment.** / For women who have family members with a BRCA-related cancer (breast, ovarian, tubal, or peritoneal cancers).  Mammogram.** / Every year beginning at age 40 years and continuing for as long as you are in good health. Consult with your health care provider.  Pap test.** / Every 3 years starting at age 9 years through age 34 or 91 years with 3 consecutive normal Pap tests. Testing can be stopped between 65 and 70 years with 3 consecutive normal Pap tests and no abnormal Pap or HPV tests in the past 10 years.  HPV screening.** / Every 3 years from ages 57 years through ages 64 or 45 years with a history of 3 consecutive normal Pap tests. Testing can be stopped between 65 and 70 years with 3 consecutive normal Pap tests and no abnormal Pap or HPV tests in the past 10 years.  Fecal occult blood test (FOBT) of stool. / Every year beginning at age 15 years and continuing until age 17 years. You may not need to do this test if you get a colonoscopy every 10 years.  Flexible sigmoidoscopy or colonoscopy.** / Every 5 years for a flexible sigmoidoscopy or every 10 years for a colonoscopy beginning at age 86 years and continuing until age 71 years.  Hepatitis C blood test.** / For all people born from [birth year] through 1966 and any individual with known risks for hepatitis C.  Osteoporosis screening.** / A one-time screening for women ages 83 years and over and women at risk for fractures or osteoporosis.  Skin self-exam. / Monthly.  Influenza vaccine. / Every year.  Tetanus, diphtheria, and acellular pertussis (Tdap/Td) vaccine.** / 1 dose of Td every 10 years.  Varicella vaccine.** / Consult your health care provider.  Zoster vaccine.** / 1 dose for adults aged 61 years or older.  Pneumococcal 13-valent conjugate (PCV13) vaccine.** / Consult your health care provider.  Pneumococcal  polysaccharide (PPSV23) vaccine.** / 1 dose for all adults aged 28 years and older.  Meningococcal vaccine.** / Consult your health care provider.  Hepatitis A vaccine.** / Consult your health care provider.  Hepatitis B vaccine.** / Consult your health care provider.  Haemophilus influenzae type b (Hib) vaccine.** / Consult your health care provider. ** Family history and personal history of risk and conditions may change your health care provider's recommendations. Document Released: 11/27/2001 Document Revised: 02/15/2014 Document Reviewed: 02/26/2011 [facility] Patient Information 2015 [place], [place]. This information is not intended to replace advice given to you by your health care provider. Make sure you discuss any questions you have with your health care provider.

## 2015-05-06 ENCOUNTER — Other Ambulatory Visit: Payer: Medicaid Other

## 2016-01-05 ENCOUNTER — Encounter: Payer: Self-pay | Admitting: Emergency Medicine

## 2016-01-05 ENCOUNTER — Emergency Department
Admission: EM | Admit: 2016-01-05 | Discharge: 2016-01-05 | Disposition: A | Discharge status: Home / Self Care | Payer: Medicaid Other | Attending: Emergency Medicine | Admitting: Emergency Medicine

## 2016-01-05 DIAGNOSIS — Z87891 Personal history of nicotine dependence: Secondary | ICD-10-CM | POA: Diagnosis not present

## 2016-01-05 DIAGNOSIS — K644 Residual hemorrhoidal skin tags: Secondary | ICD-10-CM | POA: Diagnosis not present

## 2016-01-05 DIAGNOSIS — K649 Unspecified hemorrhoids: Secondary | ICD-10-CM | POA: Diagnosis present

## 2016-01-05 MED ORDER — LIDOCAINE VISCOUS 2 % MT SOLN
15.0000 mL | Freq: Once | OROMUCOSAL | Status: AC
  Administered 2016-01-05: 15 mL via OROMUCOSAL
  Filled 2016-01-05: qty 15

## 2016-01-05 NOTE — Discharge Instructions (Signed)

## 2016-01-05 NOTE — ED Notes (Addendum)
Patient describes a "buldge" at rectum.  States for past three days with some bleeding seen on toilet paper when wiping.  C/o hemmorroid pain.  Denies abdominal pain.  Last BM 01/05/16.  Denies straining.

## 2016-01-05 NOTE — ED Provider Notes (Signed)
[facility] Emergency Department Provider Note  ____________________________________________  Time seen: 11:10 PM  I have reviewed the triage vital signs and the nursing notes.   HISTORY  Chief Complaint Hemorrhoids      HPI Joan Heath is a 30 y.o. female presents with "bulging area adjacent to her rectum for the past 3 days with bleeding noted on tissue when she wipes. Patient denies any previous history of hemorrhoids and denies any abdominal pain last bowel movement was today without straining. Current pain score 4 out of 10     Past Medical History  Diagnosis Date  . Bipolar 1 disorder (HCC)     no meds currently  . Anxiety     no meds  . GERD (gastroesophageal reflux disease)     occasional - diet controlled  . Headache     migraines - otc meds prn    Patient Active Problem List   Diagnosis Date Noted  . Menorrhagia 03/09/2015  . S/P HTA endometrial ablation on 03/09/15 03/09/2015  . FH: breast cancer in sister diagnosed at age 61 01/12/2015  . Chlamydia infection 01/12/2015  . Muscle spasm 09/22/2013  . Migraine without aura 08/18/2013  . Anxiety 08/18/2013  . Marijuana use 08/18/2013  . Bipolar disorder ([place]) 06/25/2013    Past Surgical History  Procedure Laterality Date  . Cesarean section  2007  . Appendectomy    . Cholecystectomy    . Cesarean section with bilateral tubal ligation N/A 01/22/2014    Procedure: CESAREAN SECTION WITH BILATERAL TUBAL LIGATION;  Surgeon: Truett Mainland, DO;  Location: [facility];  Service: Gynecology;  Laterality: N/A;  . Wisdom tooth extraction    . Tubal ligation    . Dilitation & currettage/hystroscopy with hydrothermal ablation N/A 03/09/2015    Procedure: HYSTEROSCOPY WITH HYDROTHERMAL ABLATION;  Surgeon: Osborne Oman, MD;  Location: [facility];  Service: Gynecology;  Laterality: N/A;    Current Outpatient Rx  Name  Route  Sig  Dispense  Refill  . ibuprofen (ADVIL,MOTRIN) 800 MG tablet  Oral   Take 1 tablet (800 mg total) by mouth 3 (three) times daily with meals as needed for moderate pain or cramping.   60 tablet   3     Allergies Peanuts and Cinnamon  Family History  Problem Relation Age of Onset  . COPD Maternal Grandmother   . Birth defects Neg Hx   . Asthma Neg Hx   . Arthritis Neg Hx   . Alcohol abuse Neg Hx   . Drug abuse Neg Hx   . Diabetes Neg Hx   . Depression Neg Hx   . Early death Neg Hx   . Hearing loss Neg Hx   . Heart disease Neg Hx   . Hyperlipidemia Neg Hx   . Hypertension Neg Hx   . Learning disabilities Neg Hx   . Kidney disease Neg Hx   . Mental illness Neg Hx   . Mental retardation Neg Hx   . Miscarriages / Stillbirths Neg Hx   . Stroke Neg Hx   . Vision loss Neg Hx   . Cancer Sister 25    lumpectomy for breast cancer  . Cancer Sister   . Cancer Sister     Social History Social History  Substance Use Topics  . Smoking status: Former Smoker -- 0.50 packs/day for 6 years    Types: Cigarettes    Quit date: 08/10/2013  . Smokeless tobacco: Never Used  . Alcohol Use:  Yes     Comment: socially    Review of Systems  Constitutional: Negative for fever. Eyes: Negative for visual changes. ENT: Negative for sore throat. Cardiovascular: Negative for chest pain. Respiratory: Negative for shortness of breath. Gastrointestinal: Negative for abdominal pain, vomiting and diarrhea. Positive for rectal pain Genitourinary: Negative for dysuria. Musculoskeletal: Negative for back pain. Skin: Negative for rash. Neurological: Negative for headaches, focal weakness or numbness.   10-point ROS otherwise negative.  ____________________________________________   PHYSICAL EXAM:  VITAL SIGNS: ED Triage Vitals  Enc Vitals Group     BP 01/05/16 2228 111/67 mmHg     Pulse Rate 01/05/16 2228 83     Resp 01/05/16 2228 18     Temp 01/05/16 2228 97.9 F (36.6 C)     Temp Source 01/05/16 2228 Oral     SpO2 01/05/16 2228 96 %      Weight 01/05/16 2228 195 lb (88.451 kg)     Height 01/05/16 2228 5\' 6"  (1.676 m)     Head Cir --      Peak Flow --      Pain Score 01/05/16 2229 5     Pain Loc --      Pain Edu? --      Excl. in [place]? --      Constitutional: Alert and oriented. Well appearing and in no distress. Eyes: Conjunctivae are normal. PERRL. Normal extraocular movements. ENT   Head: Normocephalic and atraumatic.   Nose: No congestion/rhinnorhea.   Mouth/Throat: Mucous membranes are moist.   Neck: No stridor. Gastrointestinal: Soft and nontender. No distention. There is no CVA tenderness. External hemorrhoid noted at 7:00 position with recent evidence of bleeding no active bleeding at this time Skin:  Skin is warm, dry and intact. No rash noted. Psychiatric: Mood and affect are normal. Speech and behavior are normal. Patient exhibits appropriate insight and judgment.    INITIAL IMPRESSION / ASSESSMENT AND PLAN / ED COURSE  Pertinent labs & imaging results that were available during my care of the patient were reviewed by me and considered in my medical decision making (see chart for details). History physical exam consistent with external hemorrhoid. I discussed him and options with the patient and referred her to gastroenterology for definitive treatment. Lidocaine was applied to the area.  ____________________________________________   FINAL CLINICAL IMPRESSION(S) / ED DIAGNOSES  Final diagnoses:  External hemorrhoid      Gregor Hams, MD 01/05/16 2328

## 2016-02-29 ENCOUNTER — Encounter (HOSPITAL_COMMUNITY): Payer: Self-pay | Admitting: *Deleted

## 2016-02-29 ENCOUNTER — Emergency Department (HOSPITAL_COMMUNITY): Payer: Medicaid Other

## 2016-02-29 ENCOUNTER — Emergency Department (HOSPITAL_COMMUNITY)
Admission: EM | Admit: 2016-02-29 | Discharge: 2016-02-29 | Disposition: A | Discharge status: Home / Self Care | Payer: Medicaid Other | Attending: Emergency Medicine | Admitting: Emergency Medicine

## 2016-02-29 DIAGNOSIS — Z8719 Personal history of other diseases of the digestive system: Secondary | ICD-10-CM | POA: Insufficient documentation

## 2016-02-29 DIAGNOSIS — F17210 Nicotine dependence, cigarettes, uncomplicated: Secondary | ICD-10-CM | POA: Diagnosis not present

## 2016-02-29 DIAGNOSIS — F419 Anxiety disorder, unspecified: Secondary | ICD-10-CM | POA: Insufficient documentation

## 2016-02-29 DIAGNOSIS — R079 Chest pain, unspecified: Secondary | ICD-10-CM | POA: Insufficient documentation

## 2016-02-29 LAB — CBC
HCT: 41.9 % (ref 36.0–46.0)
Hemoglobin: 14 g/dL (ref 12.0–15.0)
MCH: 29 pg (ref 26.0–34.0)
MCHC: 33.4 g/dL (ref 30.0–36.0)
MCV: 86.7 fL (ref 78.0–100.0)
PLATELETS: 218 10*3/uL (ref 150–400)
RBC: 4.83 MIL/uL (ref 3.87–5.11)
RDW: 12.5 % (ref 11.5–15.5)
WBC: 8.2 10*3/uL (ref 4.0–10.5)

## 2016-02-29 LAB — BASIC METABOLIC PANEL
Anion gap: 13 (ref 5–15)
BUN: 9 mg/dL (ref 6–20)
CALCIUM: 9.2 mg/dL (ref 8.9–10.3)
CHLORIDE: 105 mmol/L (ref 101–111)
CO2: 22 mmol/L (ref 22–32)
CREATININE: 0.85 mg/dL (ref 0.44–1.00)
GFR calc non Af Amer: >60 mL/min (ref >60)
Glucose, Bld: 100 mg/dL — ABNORMAL HIGH (ref 65–99)
Potassium: 4.5 mmol/L (ref 3.5–5.1)
SODIUM: 140 mmol/L (ref 135–145)

## 2016-02-29 LAB — LIPASE, BLOOD: LIPASE: 28 U/L (ref 11–51)

## 2016-02-29 LAB — HEPATIC FUNCTION PANEL
ALK PHOS: 38 U/L (ref 38–126)
ALT: 27 U/L (ref 14–54)
AST: 18 U/L (ref 15–41)
Albumin: 4 g/dL (ref 3.5–5.0)
BILIRUBIN TOTAL: 0.3 mg/dL (ref 0.3–1.2)
Total Protein: 6.6 g/dL (ref 6.5–8.1)

## 2016-02-29 LAB — D-DIMER, QUANTITATIVE: D-Dimer, Quant: <0.27 ug/mL-FEU (ref 0.00–0.50)

## 2016-02-29 LAB — I-STAT TROPONIN, ED: TROPONIN I, POC: 0.01 ng/mL (ref 0.00–0.08)

## 2016-02-29 LAB — BRAIN NATRIURETIC PEPTIDE: B Natriuretic Peptide: 12.1 pg/mL (ref 0.0–100.0)

## 2016-02-29 MED ORDER — GI COCKTAIL ~~LOC~~
30.0000 mL | Freq: Once | ORAL | Status: AC
  Administered 2016-02-29: 30 mL via ORAL
  Filled 2016-02-29: qty 30

## 2016-02-29 MED ORDER — OMEPRAZOLE 20 MG PO CPDR: 20.0000 mg | DELAYED_RELEASE_CAPSULE | Freq: Every day | ORAL | Status: DC

## 2016-02-29 NOTE — Discharge Instructions (Signed)
Nonspecific Chest Pain  Chest pain can be caused by many different conditions. There is always a chance that your pain could be related to something serious, such as a heart attack or a blood clot in your lungs. Chest pain can also be caused by conditions that are not life-threatening. If you have chest pain, it is very important to follow up with your health care provider. CAUSES  Chest pain can be caused by:  Heartburn.  Pneumonia or bronchitis.  Anxiety or stress.  Inflammation around your heart (pericarditis) or lung (pleuritis or pleurisy).  A blood clot in your lung.  A collapsed lung (pneumothorax). It can develop suddenly on its own (spontaneous pneumothorax) or from trauma to the chest.  Shingles infection (varicella-zoster virus).  Heart attack.  Damage to the bones, muscles, and cartilage that make up your chest wall. This can include:  Bruised bones due to injury.  Strained muscles or cartilage due to frequent or repeated coughing or overwork.  Fracture to one or more ribs.  Sore cartilage due to inflammation (costochondritis). RISK FACTORS  Risk factors for chest pain may include:  Activities that increase your risk for trauma or injury to your chest.  Respiratory infections or conditions that cause frequent coughing.  Medical conditions or overeating that can cause heartburn.  Heart disease or family history of heart disease.  Conditions or health behaviors that increase your risk of developing a blood clot.  Having had chicken pox (varicella zoster). SIGNS AND SYMPTOMS Chest pain can feel like:  Burning or tingling on the surface of your chest or deep in your chest.  Crushing, pressure, aching, or squeezing pain.  Dull or sharp pain that is worse when you move, cough, or take a deep breath.  Pain that is also felt in your back, neck, shoulder, or arm, or pain that spreads to any of these areas. Your chest pain may come and go, or it may stay  constant. DIAGNOSIS Lab tests or other studies may be needed to find the cause of your pain. Your health care provider may have you take a test called an ambulatory ECG (electrocardiogram). An ECG records your heartbeat patterns at the time the test is performed. You may also have other tests, such as:  Transthoracic echocardiogram (TTE). During echocardiography, sound waves are used to create a picture of all of the heart structures and to look at how blood flows through your heart.  Transesophageal echocardiogram (TEE).This is a more advanced imaging test that obtains images from inside your body. It allows your health care provider to see your heart in finer detail.  Cardiac monitoring. This allows your health care provider to monitor your heart rate and rhythm in real time.  Holter monitor. This is a portable device that records your heartbeat and can help to diagnose abnormal heartbeats. It allows your health care provider to track your heart activity for several days, if needed.  Stress tests. These can be done through exercise or by taking medicine that makes your heart beat more quickly.  Blood tests.  Imaging tests. TREATMENT  Your treatment depends on what is causing your chest pain. Treatment may include:  Medicines. These may include:  Acid blockers for heartburn.  Anti-inflammatory medicine.  Pain medicine for inflammatory conditions.  Antibiotic medicine, if an infection is present.  Medicines to dissolve blood clots.  Medicines to treat coronary artery disease.  Supportive care for conditions that do not require medicines. This may include:  Resting.  Applying heat  or cold packs to injured areas. °· Limiting activities until pain decreases. °HOME CARE INSTRUCTIONS °· If you were prescribed an antibiotic medicine, finish it all even if you start to feel better. °· Avoid any activities that bring on chest pain. °· Do not use any tobacco products, including  cigarettes, chewing tobacco, or electronic cigarettes. If you need help quitting, ask your health care provider. °· Do not drink alcohol. °· Take medicines only as directed by your health care provider. °· Keep all follow-up visits as directed by your health care provider. This is important. This includes any further testing if your chest pain does not go away. °· If heartburn is the cause for your chest pain, you may be told to keep your head raised (elevated) while sleeping. This reduces the chance that acid will go from your stomach into your esophagus. °· Make lifestyle changes as directed by your health care provider. These may include: °¨ Getting regular exercise. Ask your health care provider to suggest some activities that are safe for you. °¨ Eating a heart-healthy diet. A registered dietitian can help you to learn healthy eating options. °¨ Maintaining a healthy weight. °¨ Managing diabetes, if necessary. °¨ Reducing stress. °SEEK MEDICAL CARE IF: °· Your chest pain does not go away after treatment. °· You have a rash with blisters on your chest. °· You have a fever. °SEEK IMMEDIATE MEDICAL CARE IF:  °· Your chest pain is worse. °· You have an increasing cough, or you cough up blood. °· You have severe abdominal pain. °· You have severe weakness. °· You faint. °· You have chills. °· You have sudden, unexplained chest discomfort. °· You have sudden, unexplained discomfort in your arms, back, neck, or jaw. °· You have shortness of breath at any time. °· You suddenly start to sweat, or your skin gets clammy. °· You feel nauseous or you vomit. °· You suddenly feel light-headed or dizzy. °· Your heart begins to beat quickly, or it feels like it is skipping beats. °These symptoms may represent a serious problem that is an emergency. Do not wait to see if the symptoms will go away. Get medical help right away. Call your local emergency services (911 in the [place] Do not drive yourself to the hospital. °  °This  information is not intended to replace advice given to you by your health care provider. Make sure you discuss any questions you have with your health care provider. °  °Document Released: 07/11/2005 Document Revised: 10/22/2014 Document Reviewed: 05/07/2014 °Elsevier Interactive Patient Education ©2016 [employer] ° °Gastroesophageal Reflux Disease, Adult °Normally, food travels down the esophagus and stays in the stomach to be digested. However, when a person has gastroesophageal reflux disease (GERD), food and stomach acid move back up into the esophagus. When this happens, the esophagus becomes sore and inflamed. Over time, GERD can create small holes (ulcers) in the lining of the esophagus.  °CAUSES °This condition is caused by a problem with the muscle between the esophagus and the stomach (lower esophageal sphincter, or LES). Normally, the LES muscle closes after food passes through the esophagus to the stomach. When the LES is weakened or abnormal, it does not close properly, and that allows food and stomach acid to go back up into the esophagus. The LES can be weakened by certain dietary substances, medicines, and medical conditions, including: °· Tobacco use. °· Pregnancy. °· Having a hiatal hernia. °· Heavy alcohol use. °· Certain foods and beverages, such as coffee,   chocolate, onions, and peppermint. °RISK FACTORS °This condition is more likely to develop in: °· People who have an increased body weight. °· People who have connective tissue disorders. °· People who use NSAID medicines. °SYMPTOMS °Symptoms of this condition include: °· Heartburn. °· Difficult or painful swallowing. °· The feeling of having a lump in the throat. °· A bitter taste in the mouth. °· Bad breath. °· Having a large amount of saliva. °· Having an upset or bloated stomach. °· Belching. °· Chest pain. °· Shortness of breath or wheezing. °· Ongoing (chronic) cough or a night-time cough. °· Wearing away of tooth enamel. °· Weight  loss. °Different conditions can cause chest pain. Make sure to see your health care provider if you experience chest pain. °DIAGNOSIS °Your health care provider will take a medical history and perform a physical exam. To determine if you have mild or severe GERD, your health care provider may also monitor how you respond to treatment. You may also have other tests, including: °· An endoscopy to examine your stomach and esophagus with a small camera. °· A test that measures the acidity level in your esophagus. °· A test that measures how much pressure is on your esophagus. °· A barium swallow or modified barium swallow to show the shape, size, and functioning of your esophagus. °TREATMENT °The goal of treatment is to help relieve your symptoms and to prevent complications. Treatment for this condition may vary depending on how severe your symptoms are. Your health care provider may recommend: °· Changes to your diet. °· Medicine. °· Surgery. °HOME CARE INSTRUCTIONS °Diet °· Follow a diet as recommended by your health care provider. This may involve avoiding foods and drinks such as: °¨ Coffee and tea (with or without caffeine). °¨ Drinks that contain alcohol. °¨ Energy drinks and sports drinks. °¨ Carbonated drinks or sodas. °¨ Chocolate and cocoa. °¨ Peppermint and mint flavorings. °¨ Garlic and onions. °¨ Horseradish. °¨ Spicy and acidic foods, including peppers, chili powder, curry powder, vinegar, hot sauces, and barbecue sauce. °¨ Citrus fruit juices and citrus fruits, such as oranges, lemons, and limes. °¨ Tomato-based foods, such as red sauce, chili, salsa, and pizza with red sauce. °¨ Fried and fatty foods, such as donuts, french fries, potato chips, and high-fat dressings. °¨ High-fat meats, such as hot dogs and fatty cuts of red and white meats, such as rib eye steak, sausage, ham, and bacon. °¨ High-fat dairy items, such as whole milk, butter, and cream cheese. °· Eat small, frequent meals instead of large  meals. °· Avoid drinking large amounts of liquid with your meals. °· Avoid eating meals during the 2-3 hours before bedtime. °· Avoid lying down right after you eat. °· Do not exercise right after you eat. ° General Instructions  °· Pay attention to any changes in your symptoms. °· Take over-the-counter and prescription medicines only as told by your health care provider. Do not take aspirin, ibuprofen, or other NSAIDs unless your health care provider told you to do so. °· Do not use any tobacco products, including cigarettes, chewing tobacco, and e-cigarettes. If you need help quitting, ask your health care provider. °· Wear loose-fitting clothing. Do not wear anything tight around your waist that causes pressure on your abdomen. °· Raise (elevate) the head of your bed 6 inches (15cm). °· Try to reduce your stress, such as with yoga or meditation. If you need help reducing stress, ask your health care provider. °· If you are overweight, reduce your weight to an amount that is   healthy for you. Ask your health care provider for guidance about a safe weight loss goal. °· Keep all follow-up visits as told by your health care provider. This is important. °SEEK MEDICAL CARE IF: °· You have new symptoms. °· You have unexplained weight loss. °· You have difficulty swallowing, or it hurts to swallow. °· You have wheezing or a persistent cough. °· Your symptoms do not improve with treatment. °· You have a hoarse voice. °SEEK IMMEDIATE MEDICAL CARE IF: °· You have pain in your arms, neck, jaw, teeth, or back. °· You feel sweaty, dizzy, or light-headed. °· You have chest pain or shortness of breath. °· You vomit and your vomit looks like blood or coffee grounds. °· You faint. °· Your stool is bloody or black. °· You cannot swallow, drink, or eat. °  °This information is not intended to replace advice given to you by your health care provider. Make sure you discuss any questions you have with your health care provider. °    °Document Released: 07/11/2005 Document Revised: 06/22/2015 Document Reviewed: 01/26/2015 °Elsevier Interactive Patient Education ©2016 [employer]. ° °

## 2016-02-29 NOTE — ED Provider Notes (Signed)
CSN: [id number]     Arrival date & time 02/29/16  1021 History   First MD Initiated Contact with Patient 02/29/16 1043     Chief Complaint  Patient presents with  . Chest Pain     HPI Patient presents emergency department with complaints of anterior chest pain with some radiation to her upper back.  She reports when this occurred she also felt real warm and hot and her coworker said that she looked flushed.  Patient reports she has some improvement with sublingual nitroglycerin was given to her by EMS.  Patient has a history of GERD, anxiety, bipolar disorder.  She is not on any daily medications.  She is currently not on a PPI.  She denies pain in her arms or legs.  She denies abdominal pain.  Denies nausea vomiting or diarrhea.  Patient initially had some shortness of breath but reports no shortness of breath at this time.  No family history of early cardiac disease.   Past Medical History  Diagnosis Date  . Bipolar 1 disorder (HCC)     no meds currently  . Anxiety     no meds  . GERD (gastroesophageal reflux disease)     occasional - diet controlled  . Headache     migraines - otc meds prn   Past Surgical History  Procedure Laterality Date  . Cesarean section  2007  . Appendectomy    . Cholecystectomy    . Cesarean section with bilateral tubal ligation N/A 01/22/2014    Procedure: CESAREAN SECTION WITH BILATERAL TUBAL LIGATION;  Surgeon: Truett Mainland, DO;  Location: [facility];  Service: Gynecology;  Laterality: N/A;  . Wisdom tooth extraction    . Tubal ligation    . Dilitation & currettage/hystroscopy with hydrothermal ablation N/A 03/09/2015    Procedure: HYSTEROSCOPY WITH HYDROTHERMAL ABLATION;  Surgeon: Osborne Oman, MD;  Location: [facility];  Service: Gynecology;  Laterality: N/A;   Family History  Problem Relation Age of Onset  . COPD Maternal Grandmother   . Birth defects Neg Hx   . Asthma Neg Hx   . Arthritis Neg Hx   . Alcohol abuse Neg Hx   . Drug abuse Neg Hx    . Diabetes Neg Hx   . Depression Neg Hx   . Early death Neg Hx   . Hearing loss Neg Hx   . Heart disease Neg Hx   . Hyperlipidemia Neg Hx   . Hypertension Neg Hx   . Learning disabilities Neg Hx   . Kidney disease Neg Hx   . Mental illness Neg Hx   . Mental retardation Neg Hx   . Miscarriages / Stillbirths Neg Hx   . Stroke Neg Hx   . Vision loss Neg Hx   . Cancer Sister 25    lumpectomy for breast cancer  . Cancer Sister   . Cancer Sister    Social History  Substance Use Topics  . Smoking status: Former Smoker -- 0.50 packs/day for 6 years    Types: Cigarettes    Quit date: 08/10/2013  . Smokeless tobacco: Never Used  . Alcohol Use: Yes     Comment: socially   OB History    Gravida Para Term Preterm AB TAB SAB Ectopic Multiple Living   2 2 2       2      Review of Systems  All other systems reviewed and are negative.     Allergies  Peanuts and Cinnamon  Home Medications   Prior to Admission medications   Medication Sig Start Date End Date Taking? Authorizing Provider  ibuprofen (ADVIL,MOTRIN) 800 MG tablet Take 1 tablet (800 mg total) by mouth 3 (three) times daily with meals as needed for moderate pain or cramping. 03/09/15   Sallyanne Havers Anyanwu, MD          BP 120/92 mmHg  Pulse 63  Temp(Src) 98.5 F (36.9 C) (Oral)  Resp 13  Ht 5\' 6"  (1.676 m)  Wt 200 lb (90.719 kg)  BMI 32.30 kg/m2  SpO2 99% Physical Exam  Constitutional: She is oriented to person, place, and time. She appears well-developed and well-nourished. No distress.  HENT:  Head: Normocephalic and atraumatic.  Eyes: EOM are normal.  Neck: Normal range of motion.  Cardiovascular: Normal rate, regular rhythm and normal heart sounds.   Pulmonary/Chest: Effort normal and breath sounds normal.  Abdominal: Soft. She exhibits no distension. There is no tenderness.  Musculoskeletal: Normal range of motion.  Neurological: She is alert and oriented to person, place, and time.  Skin: Skin is warm  and dry.  Psychiatric:  Anxious appearing  Nursing note and vitals reviewed.   ED Course  Procedures (including critical care time) Labs Review Labs Reviewed  BASIC METABOLIC PANEL - Abnormal; Notable for the following:    Glucose, Bld 100 (*)    All other components within normal limits  HEPATIC FUNCTION PANEL - Abnormal; Notable for the following:    Bilirubin, Direct <0.1 (*)    All other components within normal limits  CBC  BRAIN NATRIURETIC PEPTIDE  LIPASE, BLOOD  D-DIMER, QUANTITATIVE (NOT AT [facility])  Randolm Idol, ED    Imaging Review Dg Chest 2 View  02/29/2016  CLINICAL DATA:  Chest pain beginning this morning radiating into the neck. Former smoker. EXAM: CHEST  2 VIEW COMPARISON:  None. FINDINGS: The cardiomediastinal silhouette is within normal limits. The lungs are well inflated and clear. There is no evidence of pleural effusion or pneumothorax. No acute osseous abnormality is identified. IMPRESSION: No active cardiopulmonary disease. Electronically Signed   By: Logan Bores M.D.   On: 02/29/2016 11:23   I have personally reviewed and evaluated these images and lab results as part of my medical decision-making.   EKG Interpretation   Date/Time:  Wednesday Feb 29 2016 10:24:37 EDT Ventricular Rate:  78 PR Interval:  162 QRS Duration: 81 QT Interval:  374 QTC Calculation: 426 R Axis:   71 Text Interpretation:  Sinus rhythm No significant change was found  Confirmed by Jeneen Doutt  MD, Lennette Bihari ([id number]) on 02/29/2016 10:29:15 AM Also  confirmed by Venora Maples  MD, Lennette Bihari ([id number]), editor Rolla Plate, Joelene Millin [phone])  on  02/29/2016 10:39:55 AM      MDM   Final diagnoses:  Chest pain, unspecified chest pain type   Overall well-appearing.  Suspect gastroesophageal reflux disease.  Patient is not on a PPI she will be initiated on this.  Doubt ACS.  D-dimer normal      Jola Schmidt, MD 02/29/16 1626

## 2016-02-29 NOTE — ED Notes (Signed)
Squeezing chest pain which began this am.  Pt states that the pain is squeezing and that it radiates to her neck.  Pt states that she was feeling "hot" and had to put the fan on.  Pt took 81mg  aspirin and 1sl nitro with some relief and ems gae her 3 more 81mg  aspirin and another 1 sl nitro which decreased pt pain further and she currently is just reporting "discomfort" 2/10

## 2016-02-29 NOTE — ED Notes (Signed)
Patient transported to X-ray 

## 2016-04-16 ENCOUNTER — Emergency Department: Payer: Medicaid Other

## 2016-04-16 ENCOUNTER — Encounter: Payer: Self-pay | Admitting: Emergency Medicine

## 2016-04-16 ENCOUNTER — Emergency Department
Admission: EM | Admit: 2016-04-16 | Discharge: 2016-04-16 | Disposition: A | Discharge status: Home / Self Care | Payer: Medicaid Other | Attending: Emergency Medicine | Admitting: Emergency Medicine

## 2016-04-16 DIAGNOSIS — M25512 Pain in left shoulder: Secondary | ICD-10-CM

## 2016-04-16 DIAGNOSIS — Z87891 Personal history of nicotine dependence: Secondary | ICD-10-CM | POA: Diagnosis not present

## 2016-04-16 DIAGNOSIS — F319 Bipolar disorder, unspecified: Secondary | ICD-10-CM | POA: Insufficient documentation

## 2016-04-16 DIAGNOSIS — M62838 Other muscle spasm: Secondary | ICD-10-CM

## 2016-04-16 MED ORDER — CYCLOBENZAPRINE HCL 10 MG PO TABS: 10.0000 mg | ORAL_TABLET | Freq: Three times a day (TID) | ORAL | Status: DC | PRN

## 2016-04-16 MED ORDER — NAPROXEN 500 MG PO TABS: 500.0000 mg | ORAL_TABLET | Freq: Two times a day (BID) | ORAL | Status: DC

## 2016-04-16 NOTE — ED Notes (Signed)
Patient presents to the ED with left shoulder since Saturday. Patient states her shoulder often pops out and pops back in but this time when she was slinging a bookbag over her shoulder and it seemed to have popped out it doesn't seem to have popped back in correctly.  Patient has difficulty moving her left arm and left shoulder appears slightly deformed and is tender to the touch.

## 2016-04-16 NOTE — ED Provider Notes (Signed)
[facility] Emergency Department Provider Note  ____________________________________________  Time seen: Approximately 5:51 PM  I have reviewed the triage vital signs and the nursing notes.   HISTORY  Chief Complaint Shoulder Pain    HPI Joan Heath is a 30 y.o. female, NAD, presents to the emergency department with 2 day history of left shoulder pain. On Saturday, she was attempting to put on a book bag when the overhead motion resulted in her shoulder popping. She thought that maybe it had popped in and out but states that the pain is different than normal and has lasted longer than it usually does.  Reports a vast history of recurrent shoulder dislocations stating her shoulder "pops in and out" frequently. 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  At rest pain is 1/10 and aching but is exacerbated to 5/10 with a sharp pain radiating down the arm with movement. Has not taken anything for pain. Patient noticed a "dip" in her shoulder that is not present on the opposite side. She denies numbness, weakness, tingling, redness, warmth, swelling. No skin sores or open wounds.    Past Medical History  Diagnosis Date  . Bipolar 1 disorder (HCC)     no meds currently  . Anxiety     no meds  . GERD (gastroesophageal reflux disease)     occasional - diet controlled  . Headache     migraines - otc meds prn    Patient Active Problem List   Diagnosis Date Noted  . Menorrhagia 03/09/2015  . S/P HTA endometrial ablation on 03/09/15 03/09/2015  . FH: breast cancer in sister diagnosed at age 73 01/12/2015  . Chlamydia infection 01/12/2015  . Muscle spasm 09/22/2013  . Migraine without aura 08/18/2013  . Anxiety 08/18/2013  . Marijuana use 08/18/2013  . Bipolar disorder ([place]) 06/25/2013    Past Surgical History  Procedure  Laterality Date  . Cesarean section  2007  . Appendectomy    . Cholecystectomy    . Cesarean section with bilateral tubal ligation N/A 01/22/2014    Procedure: CESAREAN SECTION WITH BILATERAL TUBAL LIGATION;  Surgeon: Truett Mainland, DO;  Location: [facility];  Service: Gynecology;  Laterality: N/A;  . Wisdom tooth extraction    . Tubal ligation    . Dilitation & currettage/hystroscopy with hydrothermal ablation N/A 03/09/2015    Procedure: HYSTEROSCOPY WITH HYDROTHERMAL ABLATION;  Surgeon: Osborne Oman, MD;  Location: [facility];  Service: Gynecology;  Laterality: N/A;    Current Outpatient Rx  Name  Route  Sig  Dispense  Refill  . cyclobenzaprine (FLEXERIL) 10 MG tablet   Oral   Take 1 tablet (10 mg total) by mouth 3 (three) times daily as needed for muscle spasms.   21 tablet   0   . ibuprofen (ADVIL,MOTRIN) 800 MG tablet   Oral   Take 1 tablet (800 mg total) by mouth 3 (three) times daily with meals as needed for moderate pain or cramping.   60 tablet   3   . naproxen (NAPROSYN) 500 MG tablet   Oral   Take 1 tablet (500 mg total) by mouth 2 (two) times daily with a meal.   14 tablet   0   . omeprazole (PRILOSEC) 20 MG capsule   Oral   Take 1 capsule (20 mg total) by mouth daily.  30 capsule   0     Allergies Peanuts and Cinnamon  Family History  Problem Relation Age of Onset  . COPD Maternal Grandmother   . Birth defects Neg Hx   . Asthma Neg Hx   . Arthritis Neg Hx   . Alcohol abuse Neg Hx   . Drug abuse Neg Hx   . Diabetes Neg Hx   . Depression Neg Hx   . Early death Neg Hx   . Hearing loss Neg Hx   . Heart disease Neg Hx   . Hyperlipidemia Neg Hx   . Hypertension Neg Hx   . Learning disabilities Neg Hx   . Kidney disease Neg Hx   . Mental illness Neg Hx   . Mental retardation Neg Hx   . Miscarriages / Stillbirths Neg Hx   . Stroke Neg Hx   . Vision loss Neg Hx   . Cancer Sister 25    lumpectomy for breast cancer  . Cancer Sister   . Cancer Sister      Social History Social History  Substance Use Topics  . Smoking status: Former Smoker -- 0.50 packs/day for 6 years    Types: Cigarettes    Quit date: 08/10/2013  . Smokeless tobacco: Never Used  . Alcohol Use: Yes     Comment: socially     Review of Systems  Constitutional: No fever/chills, fatigue Cardiovascular: No chest pain or palpitations Respiratory: No shortness of breath Gastrointestinal: No abdominal pain, nausea, or vomiting Musculoskeletal: Positive left shoulder pain. Negative for back, neck pain.  Skin: Negative for rash, redness, swelling, open wounds, skin sores, bruising. Neurological: Negative for headaches, focal weakness or numbness. No tingling. 10-point ROS otherwise negative.  ____________________________________________   PHYSICAL EXAM:  VITAL SIGNS: ED Triage Vitals  Enc Vitals Group     BP 04/16/16 1637 144/89 mmHg     Pulse Rate 04/16/16 1637 94     Resp 04/16/16 1637 20     Temp 04/16/16 1637 98.4 F (36.9 C)     Temp Source 04/16/16 1637 Oral     SpO2 04/16/16 1637 98 %     Weight 04/16/16 1637 195 lb (88.451 kg)     Height 04/16/16 1637 5\' 6"  (1.676 m)     Head Cir --      Peak Flow --      Pain Score 04/16/16 1640 5     Pain Loc --      Pain Edu? --      Excl. in [place]? --      Constitutional: Alert and oriented. Well appearing and in no acute distress. Eyes: Conjunctivae are normal.  Head: Atraumatic, normocephalic Neck: Supple with full range of motion. No cervical spine tenderness to palpation. Tenderness to palpation over the left trapezius with mild muscle spasm.  Hematological/Lymphatic/Immunilogical: No cervical lymphadenopathy. Cardiovascular: Normal rate, regular rhythm. Normal S1 and S2.  Good peripheral circulation with 2+ pulses noted in bilateral upper extremities.  Respiratory: Normal respiratory effort without tachypnea or retractions. Lungs CTAB with breath sounds noted in all lung fields Musculoskeletal:   Tenderness to palpation over acromioclavicular joint, proximal humerus/deltoid. No edema, erythema or crepitus about the glenoid joint. Full ROM of shoulders bilaterally with pain in the left shoulder with all movements.  Neurologic:  Normal speech and language. No gross focal neurologic deficits are appreciated. Sensation to light touch grossly intact but the left upper extremity. Skin:  Skin is warm, dry and intact. No rash, redness,  swelling, bruising lacerations nor open wounds noted. Psychiatric: Mood and affect are normal. Speech and behavior are normal. Patient exhibits appropriate insight and judgement.   ____________________________________________   LABS  None ____________________________________________  EKG  None ____________________________________________  RADIOLOGY I have personally viewed and evaluated these images (plain radiographs) as part of my medical decision making, as well as reviewing the written report by the radiologist.  Dg Shoulder Left  04/16/2016  CLINICAL DATA:  LEFT shoulder pain since Saturday, shoulder often pops out and pops back in but today popped out while slinging a book bag over her shoulder and now having pain and deformity at LEFT shoulder, tender to touch, unable to abduct arm for axillary view EXAM: LEFT SHOULDER - 2+ VIEW COMPARISON:  None FINDINGS: Osseous mineralization normal. AC joint alignment normal. No glenohumeral fracture, dislocation, or bone destruction. Visualized LEFT ribs intact. IMPRESSION: Normal exam. Electronically Signed   By: Lavonia Dana M.D.   On: 04/16/2016 17:25    ____________________________________________    PROCEDURES  Procedure(s) performed: None      Medications - No data to display   ____________________________________________   INITIAL IMPRESSION / ASSESSMENT AND PLAN / ED COURSE  Pertinent imaging results that were available during my care of the patient were reviewed by me and considered in my  medical decision making (see chart for details).  Patient's diagnosis is consistent with trapezial muscle spasm and left shoulder pain. Patient will be discharged home with prescriptions for naproxen and Flexeril to take as directed. Patient was placed in a shoulder immobilizer for comfort care. Patient is to follow up with Dr. Mack Guise in orthopedics in 2-3 days to establish care for further evaluation and treatment. Patient is given ED precautions to return to the ED for any worsening or new symptoms.    ____________________________________________  FINAL CLINICAL IMPRESSION(S) / ED DIAGNOSES  Final diagnoses:  Trapezius muscle spasm  Left shoulder pain      NEW MEDICATIONS STARTED DURING THIS VISIT:  New Prescriptions   CYCLOBENZAPRINE (FLEXERIL) 10 MG TABLET    Take 1 tablet (10 mg total) by mouth 3 (three) times daily as needed for muscle spasms.   NAPROXEN (NAPROSYN) 500 MG TABLET    Take 1 tablet (500 mg total) by mouth 2 (two) times daily with a meal.        Braxton Feathers, PA-C 04/16/16 1830  Lisa Roca, MD 04/16/16 2056

## 2016-04-16 NOTE — Discharge Instructions (Signed)
Cryotherapy Cryotherapy is when you put ice on your injury. Ice helps lessen pain and puffiness (swelling) after an injury. Ice works the best when you start using it in the first 24 to 48 hours after an injury. HOME CARE  Put a dry or damp towel between the ice pack and your skin.  You may press gently on the ice pack.  Leave the ice on for no more than 10 to 20 minutes at a time.  Check your skin after 5 minutes to make sure your skin is okay.  Rest at least 20 minutes between ice pack uses.  Stop using ice when your skin loses feeling (numbness).  Do not use ice on someone who cannot tell you when it hurts. This includes small children and people with memory problems (dementia). GET HELP RIGHT AWAY IF:  You have white spots on your skin.  Your skin turns blue or pale.  Your skin feels waxy or hard.  Your puffiness gets worse. MAKE SURE YOU:   Understand these instructions.  Will watch your condition.  Will get help right away if you are not doing well or get worse.   This information is not intended to replace advice given to you by your health care provider. Make sure you discuss any questions you have with your health care provider.   Document Released: 03/19/2008 Document Revised: 12/24/2011 Document Reviewed: 05/24/2011 Elsevier Interactive Patient Education 2016 [place] therapy can help ease sore, stiff, injured, and tight muscles and joints. Heat relaxes your muscles, which may help ease your pain. Heat therapy should only be used on old, pre-existing, or long-lasting (chronic) injuries. Do not use heat therapy unless told by your doctor. HOW TO USE HEAT THERAPY There are several different kinds of heat therapy, including:  Moist heat pack.  Warm water bath.  Hot water bottle.  Electric heating pad.  Heated gel pack.  Heated wrap.  Electric heating pad. GENERAL HEAT THERAPY RECOMMENDATIONS   Do not sleep while using heat  therapy. Only use heat therapy while you are awake.  Your skin may turn pink while using heat therapy. Do not use heat therapy if your skin turns red.  Do not use heat therapy if you have new pain.  High heat or long exposure to heat can cause burns. Be careful when using heat therapy to avoid burning your skin.  Do not use heat therapy on areas of your skin that are already irritated, such as with a rash or sunburn. GET HELP IF:   You have blisters, redness, swelling (puffiness), or numbness.  You have new pain.  Your pain is worse. MAKE SURE YOU:  Understand these instructions.  Will watch your condition.  Will get help right away if you are not doing well or get worse.   This information is not intended to replace advice given to you by your health care provider. Make sure you discuss any questions you have with your health care provider.   Document Released: 12/24/2011 Document Revised: 10/22/2014 Document Reviewed: 11/24/2013 Elsevier Interactive Patient Education 2016 Elsevier Inc[employer]  Shoulder Pain The shoulder is the joint that connects your arms to your body. The bones that form the shoulder joint include the upper arm bone (humerus), the shoulder blade (scapula), and the collarbone (clavicle). The top of the humerus is shaped like a ball and fits into a rather flat socket on the scapula (glenoid cavity). A combination of muscles and strong, fibrous tissues that  connect muscles to bones (tendons) support your shoulder joint and hold the ball in the socket. Small, fluid-filled sacs (bursae) are located in different areas of the joint. They act as cushions between the bones and the overlying soft tissues and help reduce friction between the gliding tendons and the bone as you move your arm. Your shoulder joint allows a wide range of motion in your arm. This range of motion allows you to do things like scratch your back or throw a ball. However, this range of motion also makes your  shoulder more prone to pain from overuse and injury. Causes of shoulder pain can originate from both injury and overuse and usually can be grouped in the following four categories:  Redness, swelling, and pain (inflammation) of the tendon (tendinitis) or the bursae (bursitis).  Instability, such as a dislocation of the joint.  Inflammation of the joint (arthritis).  Broken bone (fracture). HOME CARE INSTRUCTIONS   Apply ice to the sore area.  Put ice in a plastic bag.  Place a towel between your skin and the bag.  Leave the ice on for 15-20 minutes, 3-4 times per day for the first 2 days, or as directed by your health care provider.  Stop using cold packs if they do not help with the pain.  If you have a shoulder sling or immobilizer, wear it as long as your caregiver instructs. Only remove it to shower or bathe. Move your arm as little as possible, but keep your hand moving to prevent swelling.  Squeeze a soft ball or foam pad as much as possible to help prevent swelling.  Only take over-the-counter or prescription medicines for pain, discomfort, or fever as directed by your caregiver. SEEK MEDICAL CARE IF:   Your shoulder pain increases, or new pain develops in your arm, hand, or fingers.  Your hand or fingers become cold and numb.  Your pain is not relieved with medicines. SEEK IMMEDIATE MEDICAL CARE IF:   Your arm, hand, or fingers are numb or tingling.  Your arm, hand, or fingers are significantly swollen or turn white or blue. MAKE SURE YOU:   Understand these instructions.  Will watch your condition.  Will get help right away if you are not doing well or get worse.   This information is not intended to replace advice given to you by your health care provider. Make sure you discuss any questions you have with your health care provider.   Document Released: 07/11/2005 Document Revised: 10/22/2014 Document Reviewed: 01/24/2015 Elsevier Interactive Patient  Education [employer].

## 2016-04-16 NOTE — ED Notes (Signed)
See triage note  States her shoulder does seem to pop when slinging her backpack  This time feels like to shoulder did not pop back into place

## 2016-08-15 DIAGNOSIS — Z23 Encounter for immunization: Secondary | ICD-10-CM | POA: Diagnosis not present

## 2016-09-13 ENCOUNTER — Encounter: Payer: Self-pay | Admitting: Medical Oncology

## 2016-09-13 ENCOUNTER — Emergency Department
Admission: EM | Admit: 2016-09-13 | Discharge: 2016-09-13 | Disposition: A | Discharge status: Home / Self Care | Payer: BLUE CROSS/BLUE SHIELD | Attending: Emergency Medicine | Admitting: Emergency Medicine

## 2016-09-13 DIAGNOSIS — J209 Acute bronchitis, unspecified: Secondary | ICD-10-CM | POA: Diagnosis not present

## 2016-09-13 DIAGNOSIS — J069 Acute upper respiratory infection, unspecified: Secondary | ICD-10-CM | POA: Diagnosis not present

## 2016-09-13 DIAGNOSIS — Z91010 Allergy to peanuts: Secondary | ICD-10-CM | POA: Diagnosis not present

## 2016-09-13 DIAGNOSIS — Z791 Long term (current) use of non-steroidal anti-inflammatories (NSAID): Secondary | ICD-10-CM | POA: Diagnosis not present

## 2016-09-13 DIAGNOSIS — R05 Cough: Secondary | ICD-10-CM | POA: Diagnosis present

## 2016-09-13 DIAGNOSIS — Z87891 Personal history of nicotine dependence: Secondary | ICD-10-CM | POA: Diagnosis not present

## 2016-09-13 MED ORDER — FLUTICASONE PROPIONATE 50 MCG/ACT NA SUSP: 2.0000 | Freq: Every day | NASAL | 0 refills | Status: DC

## 2016-09-13 MED ORDER — BENZONATATE 100 MG PO CAPS: 100.0000 mg | ORAL_CAPSULE | Freq: Three times a day (TID) | ORAL | 0 refills | Status: DC | PRN

## 2016-09-13 MED ORDER — PREDNISONE 20 MG PO TABS
60.0000 mg | ORAL_TABLET | Freq: Once | ORAL | Status: AC
  Administered 2016-09-13: 60 mg via ORAL
  Filled 2016-09-13: qty 3

## 2016-09-13 MED ORDER — PREDNISONE 10 MG PO TABS: 10.0000 mg | ORAL_TABLET | Freq: Two times a day (BID) | ORAL | 0 refills | Status: DC

## 2016-09-13 NOTE — Discharge Instructions (Signed)
Your symptoms are consistent with a likely viral upper respiratory infection. Take the prescription meds as directed. Follow-up with your provider or [facility] as needed.

## 2016-09-13 NOTE — ED Provider Notes (Signed)
[facility] Emergency Department Provider Note ____________________________________________  Time seen: 1402  I have reviewed the triage vital signs and the nursing notes.  HISTORY  Chief Complaint  URI; Cough; and Sore Throat  HPI Joan Heath is a 30 y.o. female presents to the ED for evaluation of 5-7 days complaint of cough, sore throat, nasal congestion, and chills.She reports chest discomfort, intermittently productive cough, and bodyaches. She has been dosing Mucinex and Alka-Seltzer sporadically for symptom relief. She did receive the seasonal flu vaccine.   Past Medical History:  Diagnosis Date  . Anxiety    no meds  . Bipolar 1 disorder (HCC)    no meds currently  . GERD (gastroesophageal reflux disease)    occasional - diet controlled  . Headache    migraines - otc meds prn    Patient Active Problem List   Diagnosis Date Noted  . Menorrhagia 03/09/2015  . S/P HTA endometrial ablation on 03/09/15 03/09/2015  . FH: breast cancer in sister diagnosed at age 73 01/12/2015  . Chlamydia infection 01/12/2015  . Muscle spasm 09/22/2013  . Migraine without aura 08/18/2013  . Anxiety 08/18/2013  . Marijuana use 08/18/2013  . Bipolar disorder ([place]) 06/25/2013    Past Surgical History:  Procedure Laterality Date  . APPENDECTOMY    . CESAREAN SECTION  2007  . CESAREAN SECTION WITH BILATERAL TUBAL LIGATION N/A 01/22/2014   Procedure: CESAREAN SECTION WITH BILATERAL TUBAL LIGATION;  Surgeon: Truett Mainland, DO;  Location: [place];  Service: Gynecology;  Laterality: N/A;  . CHOLECYSTECTOMY    . DILITATION & CURRETTAGE/HYSTROSCOPY WITH HYDROTHERMAL ABLATION N/A 03/09/2015   Procedure: HYSTEROSCOPY WITH HYDROTHERMAL ABLATION;  Surgeon: Osborne Oman, MD;  Location: [place] [facility];  Service: Gynecology;  Laterality: N/A;  . TUBAL LIGATION    . WISDOM TOOTH EXTRACTION      Prior to Admission medications   Medication Sig Start Date End Date Taking?  Authorizing Provider  benzonatate (TESSALON PERLES) 100 MG capsule Take 1 capsule (100 mg total) by mouth 3 (three) times daily as needed for cough (Take 1-2 per dose). 09/13/16   Khylie Larmore V Bacon Albino Bufford, PA-C  cyclobenzaprine (FLEXERIL) 10 MG tablet Take 1 tablet (10 mg total) by mouth 3 (three) times daily as needed for muscle spasms. 04/16/16   Jami L Hagler, PA-C  fluticasone (FLONASE) 50 MCG/ACT nasal spray Place 2 sprays into both nostrils daily. 09/13/16   Chesney Suares V Bacon Teara Duerksen, PA-C  ibuprofen (ADVIL,MOTRIN) 800 MG tablet Take 1 tablet (800 mg total) by mouth 3 (three) times daily with meals as needed for moderate pain or cramping. 03/09/15   Osborne Oman, MD  naproxen (NAPROSYN) 500 MG tablet Take 1 tablet (500 mg total) by mouth 2 (two) times daily with a meal. 04/16/16   Jami L Hagler, PA-C  omeprazole (PRILOSEC) 20 MG capsule Take 1 capsule (20 mg total) by mouth daily. 02/29/16   Jola Schmidt, MD  predniSONE (DELTASONE) 10 MG tablet Take 1 tablet (10 mg total) by mouth 2 (two) times daily with a meal. 09/13/16   Jaccob Czaplicki V Bacon Emmalise Huard, PA-C    Allergies Peanuts [peanut oil] and Cinnamon  Family History  Problem Relation Age of Onset  . COPD Maternal Grandmother   . Cancer Sister 25    lumpectomy for breast cancer  . Cancer Sister   . Cancer Sister   . Birth defects Neg Hx   . Asthma Neg Hx   . Arthritis Neg Hx   .  Alcohol abuse Neg Hx   . Drug abuse Neg Hx   . Diabetes Neg Hx   . Depression Neg Hx   . Early death Neg Hx   . Hearing loss Neg Hx   . Heart disease Neg Hx   . Hyperlipidemia Neg Hx   . Hypertension Neg Hx   . Learning disabilities Neg Hx   . Kidney disease Neg Hx   . Mental illness Neg Hx   . Mental retardation Neg Hx   . Miscarriages / Stillbirths Neg Hx   . Stroke Neg Hx   . Vision loss Neg Hx     Social History Social History  Substance Use Topics  . Smoking status: Former Smoker    Packs/day: 0.50    Years: 6.00    Types: Cigarettes    Quit  date: 08/10/2013  . Smokeless tobacco: Never Used  . Alcohol use Yes     Comment: socially    Review of Systems  Constitutional: Positive for subjective fever. Eyes: Negative for visual changes. ENT: Negative for sore throat. Reports sinus congestion.  Cardiovascular: Negative for chest pain. Respiratory: Negative for shortness of breath. Reports cough.  ____________________________________________  PHYSICAL EXAM:  VITAL SIGNS: ED Triage Vitals  Enc Vitals Group     BP 09/13/16 1332 126/73     Pulse Rate 09/13/16 1332 99     Resp 09/13/16 1332 18     Temp 09/13/16 1332 98.6 F (37 C)     Temp Source 09/13/16 1332 Oral     SpO2 09/13/16 1332 97 %     Weight 09/13/16 1333 210 lb (95.3 kg)     Height 09/13/16 1333 5\' 6"  (1.676 m)     Head Circumference --      Peak Flow --      Pain Score 09/13/16 1334 7     Pain Loc --      Pain Edu? --      Excl. in [place]? --     Constitutional: Alert and oriented. Well appearing and in no distress. Head: Normocephalic and atraumatic. Eyes: Conjunctivae are normal. PERRL. Normal extraocular movements Ears: Canals clear. TMs intact bilaterally. Nose: Sinus congestion, enlarged, turbinates, clear rhinorrhea. No epistaxis. Mouth/Throat: Mucous membranes are moist. Neck: Supple. No thyromegaly. Hematological/Lymphatic/Immunological: No cervical lymphadenopathy. Cardiovascular: Normal rate, regular rhythm. Normal distal pulses. Respiratory: Normal respiratory effort. No wheezes/rales/rhonchi. Neurologic:  Normal gait without ataxia. Normal speech and language. No gross focal neurologic deficits are appreciated. Skin:  Skin is warm, dry and intact. No rash noted. ____________________________________________  PROCEDURES  Prednisone 60 mg PO ____________________________________________  INITIAL IMPRESSION / ASSESSMENT AND PLAN / ED COURSE  Patient with a likely viral URI with bronchospasm. She is discharged with a prescription for  prednisone, Tessalon Perles, and Flonase. She is encouraged to take an over-the-counter allergy medicine plus decongestant for additional symptom relief. She will follow with [facility] or her primary care provider for ongoing symptom management.  Clinical Course    ____________________________________________  FINAL CLINICAL IMPRESSION(S) / ED DIAGNOSES  Final diagnoses:  Viral upper respiratory tract infection  Acute bronchitis, unspecified organism      Melvenia Needles, PA-C 09/13/16 1713    Nance Pear, MD 09/13/16 2105

## 2016-09-13 NOTE — ED Triage Notes (Signed)
Pt reports she began Monday having cough, chills, sore throat and nasal congestion.

## 2017-03-07 ENCOUNTER — Other Ambulatory Visit: Payer: Self-pay | Admitting: Obstetrics & Gynecology

## 2017-03-07 DIAGNOSIS — D241 Benign neoplasm of right breast: Secondary | ICD-10-CM

## 2017-03-12 ENCOUNTER — Ambulatory Visit
Admission: RE | Admit: 2017-03-12 | Discharge: 2017-03-12 | Disposition: A | Discharge status: Home / Self Care | Payer: BLUE CROSS/BLUE SHIELD | Source: Ambulatory Visit | Attending: Obstetrics & Gynecology | Admitting: Obstetrics & Gynecology

## 2017-03-12 ENCOUNTER — Other Ambulatory Visit: Payer: Self-pay | Admitting: Obstetrics & Gynecology

## 2017-03-12 DIAGNOSIS — D241 Benign neoplasm of right breast: Secondary | ICD-10-CM

## 2017-03-12 DIAGNOSIS — N6489 Other specified disorders of breast: Secondary | ICD-10-CM | POA: Diagnosis not present

## 2017-03-12 DIAGNOSIS — R922 Inconclusive mammogram: Secondary | ICD-10-CM | POA: Diagnosis not present

## 2017-10-01 ENCOUNTER — Encounter: Payer: Self-pay | Admitting: Family Medicine

## 2017-10-01 ENCOUNTER — Ambulatory Visit: Payer: BLUE CROSS/BLUE SHIELD | Admitting: Family Medicine

## 2017-10-01 VITALS — BP 125/84 | HR 89 | Temp 98.4°F | Ht 66.56 in | Wt 216.4 lb

## 2017-10-01 DIAGNOSIS — F419 Anxiety disorder, unspecified: Secondary | ICD-10-CM | POA: Diagnosis not present

## 2017-10-01 DIAGNOSIS — F3175 Bipolar disorder, in partial remission, most recent episode depressed: Secondary | ICD-10-CM

## 2017-10-01 DIAGNOSIS — R5383 Other fatigue: Secondary | ICD-10-CM

## 2017-10-01 DIAGNOSIS — Z23 Encounter for immunization: Secondary | ICD-10-CM

## 2017-10-01 DIAGNOSIS — Z13220 Encounter for screening for lipoid disorders: Secondary | ICD-10-CM

## 2017-10-01 DIAGNOSIS — R739 Hyperglycemia, unspecified: Secondary | ICD-10-CM

## 2017-10-01 LAB — UA/M W/RFLX CULTURE, ROUTINE
BILIRUBIN UA: NEGATIVE
Glucose, UA: NEGATIVE
KETONES UA: NEGATIVE
Nitrite, UA: NEGATIVE
PH UA: 6 (ref 5.0–7.5)
Protein, UA: NEGATIVE
RBC UA: NEGATIVE
SPEC GRAV UA: 1.025 (ref 1.005–1.030)
UUROB: 0.2 mg/dL (ref 0.2–1.0)

## 2017-10-01 LAB — BAYER DCA HB A1C WAIVED: HB A1C (BAYER DCA - WAIVED): 5.4 % (ref <7.0)

## 2017-10-01 LAB — MICROALBUMIN, URINE WAIVED
Creatinine, Urine Waived: 300 mg/dL (ref 10–300)
MICROALB, UR WAIVED: 30 mg/L — AB (ref 0–19)
Microalb/Creat Ratio: <30 mg/g (ref <30)

## 2017-10-01 LAB — MICROSCOPIC EXAMINATION
BACTERIA UA: NONE SEEN
RBC, UA: NONE SEEN /hpf (ref 0–?)

## 2017-10-01 MED ORDER — BUSPIRONE HCL 5 MG PO TABS: 5.0000 mg | ORAL_TABLET | Freq: Three times a day (TID) | ORAL | 3 refills | Status: DC

## 2017-10-01 NOTE — Assessment & Plan Note (Signed)
Stable off medicine. Continue to monitor closely. Call with any concerns.

## 2017-10-01 NOTE — Progress Notes (Addendum)
BP 125/84 (BP Location: Left Arm, Patient Position: Sitting, Cuff Size: Large)   Pulse 89   Temp 98.4 F (36.9 C)   Ht 5' 6.56" (1.691 m)   Wt 216 lb 6 oz (98.1 kg)   SpO2 97%   BMI 34.34 kg/m    Subjective:    Patient ID: Joan Heath, female    DOB: 01-Aug-1986, 31 y.o.   MRN: [id number]  HPI: Joan Heath is a 31 y.o. female who presents today to establish care.   Chief Complaint  Patient presents with  . Blood Sugar Problem    Patient states that she has been having problems with her sugar, it has been running right and running low  . Anxiety   Last week she was sitting at her desk and she started to feel funny and as she was walking she was feeling shakey and nauseous. She had her BP checked and it was perfect. They checked her sugar and it was 69. She felt a bit better after that. She notes that she was still feeling funny. Sugars are running generally between the 60s-140s. She notes that she has had an ablation and had her tubes tied so she does not think she could be pregnant. Has lost about 10-15lbs without effort. Main feeling is shaking and feeling nauseous. Thought that this was more of a panic attack- but it felt different.   ANXIETY/STRESS-Has been off her bipolar medicine for about 4 years now. Has never had a manic episode. She gets more depressed. She is careful about her sleep and with the light. She is not interested in any medication at this time.  Duration:uncontrolled Anxious mood: yes  Excessive worrying: yes Irritability: yes  Sweating: no Nausea: yes Palpitations:yes Hyperventilation: no Panic attacks: yes Agoraphobia: no  Obscessions/compulsions: no Depressed mood: yes Depression screen PHQ 2/9 10/01/2017  Decreased Interest 1  Down, Depressed, Hopeless 1  PHQ - 2 Score 2   GAD 7 : Generalized Anxiety Score 10/01/2017  Nervous, Anxious, on Edge 3  Control/stop worrying 1  Worry too much - different things 3  Trouble relaxing 2  Restless 1    Easily annoyed or irritable 3  Afraid - awful might happen 3  Total GAD 7 Score 16   Anhedonia: no Weight changes: yes Insomnia: no   Hypersomnia: no Fatigue/loss of energy: yes Feelings of worthlessness: no Feelings of guilt: no Impaired concentration/indecisiveness: no Suicidal ideations: no  Crying spells: no Recent Stressors/Life Changes: no   Relationship problems: no   Family stress: no     Financial stress: no    Job stress: no    Recent death/loss: no   Active Ambulatory Problems    Diagnosis Date Noted  . Bipolar disorder ([place]) 06/25/2013  . Migraine without aura 08/18/2013  . Anxiety 08/18/2013  . Marijuana use 08/18/2013  . Muscle spasm 09/22/2013  . FH: breast cancer in sister diagnosed at age 107 01/12/2015  . Menorrhagia 03/09/2015  . S/P HTA endometrial ablation on 03/09/15 03/09/2015   Resolved Ambulatory Problems    Diagnosis Date Noted  . Supervision of other normal pregnancy 06/25/2013  . Tobacco smoking complicating pregnancy 63/10/6008  . Bipolar disease in pregnancy ([place]) 06/25/2013  . Previous cesarean section complicating pregnancy 93/23/5573  . Obesity in pregnancy 09/22/2013  . Desires Sterilization 10/27/2013  . Pregnancy 01/21/2014  . Status post cesarean section 01/23/2014  . Chlamydia infection 01/12/2015   Past Medical History:  Diagnosis Date  . Anxiety   .  Bipolar 1 disorder ([place])   . Chlamydia infection 01/12/2015  . GERD (gastroesophageal reflux disease)   . Headache    Past Surgical History:  Procedure Laterality Date  . APPENDECTOMY    . 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 CESAREAN SECTION  2007  . CESAREAN SECTION WITH BILATERAL TUBAL LIGATION N/A 01/22/2014   Procedure: CESAREAN SECTION WITH BILATERAL TUBAL LIGATION;  Surgeon: Truett Mainland, DO;  Location: [place] [facility];  Service: Gynecology;  Laterality: N/A;  . CHOLECYSTECTOMY    . DILITATION & CURRETTAGE/HYSTROSCOPY WITH HYDROTHERMAL ABLATION N/A 03/09/2015    Procedure: HYSTEROSCOPY WITH HYDROTHERMAL ABLATION;  Surgeon: Osborne Oman, MD;  Location: [facility];  Service: Gynecology;  Laterality: N/A;  . TUBAL LIGATION    . WISDOM TOOTH EXTRACTION     Outpatient Encounter Medications as of 10/01/2017  Medication Sig  . busPIRone (BUSPAR) 5 MG tablet Take 1-3 tablets (5-15 mg total) by mouth 3 (three) times daily.  . [DISCONTINUED] benzonatate (TESSALON PERLES) 100 MG capsule Take 1 capsule (100 mg total) by mouth 3 (three) times daily as needed for cough (Take 1-2 per dose).  . [DISCONTINUED] cyclobenzaprine (FLEXERIL) 10 MG tablet Take 1 tablet (10 mg total) by mouth 3 (three) times daily as needed for muscle spasms.  . [DISCONTINUED] fluticasone (FLONASE) 50 MCG/ACT nasal spray Place 2 sprays into both nostrils daily.  . [DISCONTINUED] ibuprofen (ADVIL,MOTRIN) 800 MG tablet Take 1 tablet (800 mg total) by mouth 3 (three) times daily with meals as needed for moderate pain or cramping.  . [DISCONTINUED] naproxen (NAPROSYN) 500 MG tablet Take 1 tablet (500 mg total) by mouth 2 (two) times daily with a meal.  . [DISCONTINUED] omeprazole (PRILOSEC) 20 MG capsule Take 1 capsule (20 mg total) by mouth daily.  . [DISCONTINUED] predniSONE (DELTASONE) 10 MG tablet Take 1 tablet (10 mg total) by mouth 2 (two) times daily with a meal.   No facility-administered encounter medications on file as of 10/01/2017.    Allergies  Allergen Reactions  . Peanuts [Peanut Oil] Anaphylaxis  . Cinnamon Swelling   Social History   Socioeconomic History  . Marital status: Single    Spouse name: Not on file  . Number of children: Not on file  . Years of education: Not on file  . Highest education level: Not on file  Social Needs  . Financial resource strain: Not on file  . Food insecurity - worry: Not on file  . Food insecurity - inability: Not on file  . Transportation needs - medical: Not on file  . Transportation needs - non-medical: Not on file  Occupational  History  . Not on file  Tobacco Use  . Smoking status: Former Smoker    Packs/day: 0.25    Years: 6.00    Pack years: 1.50    Types: Cigarettes    Last attempt to quit: 08/10/2013    Years since quitting: 4.1  . Smokeless tobacco: Never Used  Substance and Sexual Activity  . Alcohol use: Yes    Comment: socially  . Drug use: No    Comment: last use in 10/2013  . Sexual activity: Yes    Birth control/protection: Surgical  Other Topics Concern  . Not on file  Social History Narrative  . Not on file   Family History  Problem Relation Age of Onset  . COPD Maternal Grandmother   . Cancer Sister 25       lumpectomy for breast cancer  .  Breast cancer Sister   . Cancer Sister   . Birth defects Neg Hx   . Asthma Neg Hx   . Arthritis Neg Hx   . Alcohol abuse Neg Hx   . Drug abuse Neg Hx   . Diabetes Neg Hx   . Depression Neg Hx   . Early death Neg Hx   . Hearing loss Neg Hx   . Heart disease Neg Hx   . Hyperlipidemia Neg Hx   . Hypertension Neg Hx   . Learning disabilities Neg Hx   . Kidney disease Neg Hx   . Mental illness Neg Hx   . Mental retardation Neg Hx   . Miscarriages / Stillbirths Neg Hx   . Stroke Neg Hx   . Vision loss Neg Hx     Review of Systems  Constitutional: Negative.   Respiratory: Negative.   Cardiovascular: Negative.   Neurological: Positive for dizziness and light-headedness. Negative for tremors, seizures, syncope, facial asymmetry, speech difficulty, weakness, numbness and headaches.  Psychiatric/Behavioral: Negative for agitation, behavioral problems, confusion, decreased concentration, dysphoric mood, hallucinations and self-injury. The patient is nervous/anxious. The patient is not hyperactive.     Per HPI unless specifically indicated above     Objective:    BP 125/84 (BP Location: Left Arm, Patient Position: Sitting, Cuff Size: Large)   Pulse 89   Temp 98.4 F (36.9 C)   Ht 5' 6.56" (1.691 m)   Wt 216 lb 6 oz (98.1 kg)   SpO2 97%    BMI 34.34 kg/m   Wt Readings from Last 3 Encounters:  10/01/17 216 lb 6 oz (98.1 kg)  09/13/16 210 lb (95.3 kg)  04/16/16 195 lb (88.5 kg)    Physical Exam  Constitutional: She is oriented to person, place, and time. She appears well-developed and well-nourished. No distress.  HENT:  Head: Normocephalic and atraumatic.  Right Ear: Hearing normal.  Left Ear: Hearing normal.  Nose: Nose normal.  Eyes: Conjunctivae and lids are normal. Right eye exhibits no discharge. Left eye exhibits no discharge. No scleral icterus.  Cardiovascular: Normal rate, regular rhythm, normal heart sounds and intact distal pulses. Exam reveals no gallop and no friction rub.  No murmur heard. Pulmonary/Chest: Effort normal and breath sounds normal. No respiratory distress. She has no wheezes. She has no rales. She exhibits no tenderness.  Musculoskeletal: Normal range of motion.  Neurological: She is alert and oriented to person, place, and time.  Skin: Skin is warm, dry and intact. No rash noted. She is not diaphoretic. No erythema. No pallor.  Psychiatric: She has a normal mood and affect. Her speech is normal and behavior is normal. Judgment and thought content normal. Cognition and memory are normal.  Nursing note and vitals reviewed.   Results for orders placed or performed in visit on 10/01/17  Microscopic Examination  Result Value Ref Range   WBC, UA 0-5 0 - 5 /hpf   RBC, UA None seen 0 - 2 /hpf   Epithelial Cells (non renal) 0-10 0 - 10 /hpf   Bacteria, UA None seen None seen/Few  Bayer DCA Hb A1c Waived  Result Value Ref Range   Bayer DCA Hb A1c Waived 5.4 <7.0 %  Microalbumin, Urine Waived  Result Value Ref Range   Microalb, Ur Waived 30 (H) 0 - 19 mg/L   Creatinine, Urine Waived 300 10 - 300 mg/dL   Microalb/Creat Ratio <30 <30 mg/g  UA/M w/rflx Culture, Routine  Result Value Ref Range  Specific Gravity, UA 1.025 1.005 - 1.030   pH, UA 6.0 5.0 - 7.5   Color, UA Yellow Yellow    Appearance Ur Cloudy (A) Clear   Leukocytes, UA Trace (A) Negative   Protein, UA Negative Negative/Trace   Glucose, UA Negative Negative   Ketones, UA Negative Negative   RBC, UA Negative Negative   Bilirubin, UA Negative Negative   Urobilinogen, Ur 0.2 0.2 - 1.0 mg/dL   Nitrite, UA Negative Negative   Microscopic Examination See below:       Assessment & Plan:   Problem List Items Addressed This Visit      Other   Bipolar disorder ([place])    Stable off medicine. Continue to monitor closely. Call with any concerns.       Anxiety    Not under great control. Discussed that we do not prescribe controlled substances on the first visit. Will try buspar to see how she does- may still need a benzo for panic attacks. Call with any concerns.       Relevant Medications   busPIRone (BUSPAR) 5 MG tablet    Other Visit Diagnoses    Hyperglycemia    -  Primary   A1c came back at 5.4. Discussed diet for hypoglycemia. Conitnue to monitor. Call with any concerns or if not getting better. Call with any concerns.    Relevant Orders   Bayer DCA Hb A1c Waived (Completed)   Comprehensive metabolic panel   Microalbumin, Urine Waived (Completed)   TSH   UA/M w/rflx Culture, Routine (Completed)   Microscopic Examination (Completed)   Immunization due       Flu shot given today.   Relevant Orders   Flu Vaccine QUAD 6+ mos PF IM (Fluarix Quad PF) (Completed)   Screening for cholesterol level       Labs drawn today. Await results. Call with any concerns.    Relevant Orders   Lipid Panel w/o Chol/HDL Ratio   Fatigue, unspecified type       Labs drawn today. Await results. Call with any concerns.    Relevant Orders   CBC with Differential/Platelet   Comprehensive metabolic panel   TSH   UA/M w/rflx Culture, Routine (Completed)       Follow up plan: Return in about 3 months (around 12/30/2017) for Physical.

## 2017-10-01 NOTE — Assessment & Plan Note (Signed)
Not under great control. Discussed that we do not prescribe controlled substances on the first visit. Will try buspar to see how she does- may still need a benzo for panic attacks. Call with any concerns.

## 2017-10-01 NOTE — Patient Instructions (Addendum)
Hypoglycemia  Hypoglycemia occurs when the level of sugar (glucose) in the blood is too low. Glucose is a type of sugar that provides the body's main source of energy. Certain hormones (insulin and glucagon) control the level of glucose in the blood. Insulin lowers blood glucose, and glucagon increases blood glucose. Hypoglycemia can result from having too much insulin in the bloodstream, or from not eating enough food that contains glucose.  Hypoglycemia can happen in people who do or do not have diabetes. It can develop quickly, and it can be a medical emergency.  What are the causes?  Hypoglycemia occurs most often in people who have diabetes. If you have diabetes, hypoglycemia may be caused by:   Diabetes medicine.   Not eating enough, or not eating often enough.   Increased physical activity.   Drinking alcohol, especially when you have not eaten recently.    If you do not have diabetes, hypoglycemia may be caused by:   A tumor in the pancreas. The pancreas is the organ that makes insulin.   Not eating enough, or not eating for long periods at a time (fasting).   Severe infection or illness that affects the liver, heart, or kidneys.   Certain medicines.    You may also have reactive hypoglycemia. This condition causes hypoglycemia within 4 hours of eating a meal. This may occur after having stomach surgery. Sometimes, the cause of reactive hypoglycemia is not known.  What increases the risk?  Hypoglycemia is more likely to develop in:   People who have diabetes and take medicines to lower blood glucose.   People who abuse alcohol.   People who have a severe illness.    What are the signs or symptoms?  Hypoglycemia may not cause any symptoms. If you have symptoms, they may include:   Hunger.   Anxiety.   Sweating and feeling clammy.   Confusion.   Dizziness or feeling light-headed.   Sleepiness.   Nausea.   Increased heart rate.   Headache.   Blurry  vision.   Seizure.   Nightmares.   Tingling or numbness around the mouth, lips, or tongue.   A change in speech.   Decreased ability to concentrate.   A change in coordination.   Restless sleep.   Tremors or shakes.   Fainting.   Irritability.    How is this diagnosed?  Hypoglycemia is diagnosed with a blood test to measure your blood glucose level. This blood test is done while you are having symptoms. Your health care provider may also do a physical exam and review your medical history.  If you do not have diabetes, other tests may be done to find the cause of your hypoglycemia.  How is this treated?  This condition can often be treated by immediately eating or drinking something that contains glucose, such as:   3-4 sugar tablets (glucose pills).   Glucose gel, 15-gram tube.   Fruit juice, 4 oz (120 mL).   Regular soda (not diet soda), 4 oz (120 mL).   Low-fat milk, 4 oz (120 mL).   Several pieces of hard candy.   Sugar or honey, 1 Tbsp.    Treating Hypoglycemia If You Have Diabetes    If you are alert and able to swallow safely, follow the 15:15 rule:   Take 15 grams of a rapid-acting carbohydrate. Rapid-acting options include:  ? 1 tube of glucose gel.  ? 3 glucose pills.  ? 6-8 pieces of hard candy.  ?   4 oz (120 mL) of fruit juice.  ? 4 oz (120 ml) of regular (not diet) soda.   Check your blood glucose 15 minutes after you take the carbohydrate.   If the repeat blood glucose level is still at or below 70 mg/dL (3.9 mmol/L), take 15 grams of a carbohydrate again.   If your blood glucose level does not increase above 70 mg/dL (3.9 mmol/L) after 3 tries, seek emergency medical care.   After your blood glucose level returns to normal, eat a meal or a snack within 1 hour.    Treating Severe Hypoglycemia  Severe hypoglycemia is when your blood glucose level is at or below 54 mg/dL (3 mmol/L). Severe hypoglycemia is an emergency. Do not wait to see if the symptoms will go away. Get medical help  right away. Call your local emergency services (911 in the [place] Do not drive yourself to the hospital.  If you have severe hypoglycemia and you cannot eat or drink, you may need an injection of glucagon. A family member or close friend should learn how to check your blood glucose and how to give you a glucagon injection. Ask your health care provider if you need to have an emergency glucagon injection kit available.  Severe hypoglycemia may need to be treated in a hospital. The treatment may include getting glucose through an IV tube. You may also need treatment for the cause of your hypoglycemia.  Follow these instructions at home:  General instructions   Avoid any diets that cause you to not eat enough food. Talk with your health care provider before you start any new diet.   Take over-the-counter and prescription medicines only as told by your health care provider.   Limit alcohol intake to no more than 1 drink per day for nonpregnant women and 2 drinks per day for men. One drink equals 12 oz of beer, 5 oz of wine, or 1 oz of hard liquor.   Keep all follow-up visits as told by your health care provider. This is important.  If You Have Diabetes:     Make sure you know the symptoms of hypoglycemia.   Always have a rapid-acting carbohydrate snack with you to treat low blood sugar.   Follow your diabetes management plan, as told by your health care provider. Make sure you:  ? Take your medicines as directed.  ? Follow your exercise plan.  ? Follow your meal plan. Eat on time, and do not skip meals.  ? Check your blood glucose as often as directed. Make sure to check your blood glucose before and after exercise. If you exercise longer or in a different way than usual, check your blood glucose more often.  ? Follow your sick day plan whenever you cannot eat or drink normally. Make this plan in advance with your health care provider.   Share your diabetes management plan with people in your workplace, school,  and household.   Check your urine for ketones when you are ill and as told by your health care provider.   Carry a medical alert card or wear medical alert jewelry.  If You Have Reactive Hypoglycemia or Low Blood Sugar From Other Causes:   Monitor your blood glucose as told by your health care provider.   Follow instructions from your health care provider about eating or drinking restrictions.  Contact a health care provider if:   You have problems keeping your blood glucose in your target range.   You have   frequent episodes of hypoglycemia.  Get help right away if:   You continue to have hypoglycemia symptoms after eating or drinking something containing glucose.   Your blood glucose is at or below 54 mg/dL (3 mmol/L).   You have a seizure.   You faint.  These symptoms may represent a serious problem that is an emergency. Do not wait to see if the symptoms will go away. Get medical help right away. Call your local emergency services (911 in the [place] Do not drive yourself to the hospital.  This information is not intended to replace advice given to you by your health care provider. Make sure you discuss any questions you have with your health care provider.  Document Released: 10/01/2005 Document Revised: 03/14/2016 Document Reviewed: 11/04/2015  Elsevier Interactive Patient Education  2018 [employer]

## 2017-10-02 ENCOUNTER — Encounter: Payer: Self-pay | Admitting: Family Medicine

## 2017-10-02 LAB — CBC WITH DIFFERENTIAL/PLATELET
Basophils Absolute: 0 10*3/uL (ref 0.0–0.2)
Basos: 0 %
EOS (ABSOLUTE): 0.3 10*3/uL (ref 0.0–0.4)
Eos: 2 %
Hematocrit: 43.4 % (ref 34.0–46.6)
Hemoglobin: 14.5 g/dL (ref 11.1–15.9)
IMMATURE GRANULOCYTES: 0 %
Immature Grans (Abs): 0 10*3/uL (ref 0.0–0.1)
LYMPHS ABS: 3.3 10*3/uL — AB (ref 0.7–3.1)
Lymphs: 28 %
MCH: 29.7 pg (ref 26.6–33.0)
MCHC: 33.4 g/dL (ref 31.5–35.7)
MCV: 89 fL (ref 79–97)
MONOS ABS: 0.6 10*3/uL (ref 0.1–0.9)
Monocytes: 6 %
NEUTROS PCT: 64 %
Neutrophils Absolute: 7.3 10*3/uL — ABNORMAL HIGH (ref 1.4–7.0)
PLATELETS: 288 10*3/uL (ref 150–379)
RBC: 4.89 x10E6/uL (ref 3.77–5.28)
RDW: 13.8 % (ref 12.3–15.4)
WBC: 11.6 10*3/uL — AB (ref 3.4–10.8)

## 2017-10-02 LAB — COMPREHENSIVE METABOLIC PANEL
A/G RATIO: 2 (ref 1.2–2.2)
ALK PHOS: 55 IU/L (ref 39–117)
ALT: 21 IU/L (ref 0–32)
AST: 16 IU/L (ref 0–40)
Albumin: 4.9 g/dL (ref 3.5–5.5)
BUN/Creatinine Ratio: 6 — ABNORMAL LOW (ref 9–23)
BUN: 5 mg/dL — ABNORMAL LOW (ref 6–20)
Bilirubin Total: 0.2 mg/dL (ref 0.0–1.2)
CALCIUM: 8.8 mg/dL (ref 8.7–10.2)
CO2: 22 mmol/L (ref 20–29)
CREATININE: 0.8 mg/dL (ref 0.57–1.00)
Chloride: 102 mmol/L (ref 96–106)
GFR calc Af Amer: 113 mL/min/{1.73_m2} (ref >59)
GFR, EST NON AFRICAN AMERICAN: 98 mL/min/{1.73_m2} (ref >59)
GLOBULIN, TOTAL: 2.5 g/dL (ref 1.5–4.5)
Glucose: 79 mg/dL (ref 65–99)
POTASSIUM: 4.1 mmol/L (ref 3.5–5.2)
SODIUM: 139 mmol/L (ref 134–144)
Total Protein: 7.4 g/dL (ref 6.0–8.5)

## 2017-10-02 LAB — LIPID PANEL W/O CHOL/HDL RATIO
CHOLESTEROL TOTAL: 181 mg/dL (ref 100–199)
HDL: 49 mg/dL (ref >39)
LDL Calculated: 114 mg/dL — ABNORMAL HIGH (ref 0–99)
TRIGLYCERIDES: 89 mg/dL (ref 0–149)
VLDL Cholesterol Cal: 18 mg/dL (ref 5–40)

## 2017-10-02 LAB — TSH: TSH: 2.74 u[IU]/mL (ref 0.450–4.500)

## 2017-12-31 ENCOUNTER — Encounter: Payer: Self-pay | Admitting: Family Medicine

## 2017-12-31 ENCOUNTER — Ambulatory Visit (INDEPENDENT_AMBULATORY_CARE_PROVIDER_SITE_OTHER): Payer: BLUE CROSS/BLUE SHIELD | Admitting: Family Medicine

## 2017-12-31 VITALS — BP 120/87 | HR 98 | Temp 98.6°F | Ht 67.0 in | Wt 217.0 lb

## 2017-12-31 DIAGNOSIS — F3170 Bipolar disorder, currently in remission, most recent episode unspecified: Secondary | ICD-10-CM

## 2017-12-31 DIAGNOSIS — F419 Anxiety disorder, unspecified: Secondary | ICD-10-CM

## 2017-12-31 DIAGNOSIS — L219 Seborrheic dermatitis, unspecified: Secondary | ICD-10-CM

## 2017-12-31 DIAGNOSIS — Z0000 Encounter for general adult medical examination without abnormal findings: Secondary | ICD-10-CM

## 2017-12-31 DIAGNOSIS — K219 Gastro-esophageal reflux disease without esophagitis: Secondary | ICD-10-CM | POA: Diagnosis not present

## 2017-12-31 DIAGNOSIS — Z87892 Personal history of anaphylaxis: Secondary | ICD-10-CM

## 2017-12-31 MED ORDER — OMEPRAZOLE 20 MG PO CPDR: 20.0000 mg | DELAYED_RELEASE_CAPSULE | Freq: Every day | ORAL | 3 refills | Status: DC

## 2017-12-31 MED ORDER — EPINEPHRINE 0.3 MG/0.3ML IJ SOAJ: 0.3000 mg | Freq: Once | INTRAMUSCULAR | 12 refills | Status: AC

## 2017-12-31 MED ORDER — KETOCONAZOLE 2 % EX SHAM: 1.0000 "application " | MEDICATED_SHAMPOO | CUTANEOUS | 3 refills | Status: DC

## 2017-12-31 NOTE — Progress Notes (Signed)
BP 120/87 (BP Location: Left Arm, Patient Position: Sitting, Cuff Size: Normal)   Pulse 98   Temp 98.6 F (37 C)   Ht 5\' 7"  (1.702 m)   Wt 217 lb (98.4 kg)   SpO2 97%   BMI 33.99 kg/m    Subjective:    Patient ID: Joan Heath, female    DOB: [birth year], 32 y.o.   MRN: [id number]  HPI: Joan Heath is a 32 y.o. female presenting on 12/31/2017 for comprehensive medical examination. Current medical complaints include:none  She currently lives with: kids and significant other Menopausal Symptoms: yes- hot flashes for the last 4 years since she had her ablation  Depression Screen done today and results listed below:  Depression screen [facility] 2/9 12/31/2017 10/01/2017  Decreased Interest 1 1  Down, Depressed, Hopeless 1 1  PHQ - 2 Score 2 2  Altered sleeping 3 -  Tired, decreased energy 2 -  Change in appetite 1 -  Feeling bad or failure about yourself  1 -  Trouble concentrating 0 -  Moving slowly or fidgety/restless 0 -  Suicidal thoughts 1 -  PHQ-9 Score 10 -  Difficult doing work/chores Somewhat difficult -    Past Medical History:  Past Medical History:  Diagnosis Date  . Anxiety    no meds  . Bipolar 1 disorder (HCC)    no meds currently  . Chlamydia infection 01/12/2015  . GERD (gastroesophageal reflux disease)    occasional - diet controlled  . Headache    migraines - otc meds prn    Surgical History:  Past Surgical History:  Procedure Laterality Date  . APPENDECTOMY    . 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 CESAREAN SECTION  2007  . CESAREAN SECTION WITH BILATERAL TUBAL LIGATION N/A 01/22/2014   Procedure: CESAREAN SECTION WITH BILATERAL TUBAL LIGATION;  Surgeon: Truett Mainland, DO;  Location: [facility];  Service: Gynecology;  Laterality: N/A;  . CHOLECYSTECTOMY    . DILITATION & CURRETTAGE/HYSTROSCOPY WITH HYDROTHERMAL ABLATION N/A 03/09/2015   Procedure: HYSTEROSCOPY WITH HYDROTHERMAL ABLATION;  Surgeon: Osborne Oman, MD;  Location: [facility];   Service: Gynecology;  Laterality: N/A;  . TUBAL LIGATION    . WISDOM TOOTH EXTRACTION      Medications:  Current Outpatient Medications on File Prior to Visit  Medication Sig  . busPIRone (BUSPAR) 5 MG tablet Take 1-3 tablets (5-15 mg total) by mouth 3 (three) times daily.   No current facility-administered medications on file prior to visit.     Allergies:  Allergies  Allergen Reactions  . Peanuts [Peanut Oil] Anaphylaxis  . Cinnamon Swelling    Social History:  Social History   Socioeconomic History  . Marital status: Single    Spouse name: Not on file  . Number of children: Not on file  . Years of education: Not on file  . Highest education level: Not on file  Social Needs  . Financial resource strain: Not on file  . Food insecurity - worry: Not on file  . Food insecurity - inability: Not on file  . Transportation needs - medical: Not on file  . Transportation needs - non-medical: Not on file  Occupational History  . Not on file  Tobacco Use  . Smoking status: Former Smoker    Packs/day: 0.25    Years: 6.00    Pack years: 1.50    Types: Cigarettes    Last attempt to quit: 08/10/2013    Years  since quitting: 4.3  . Smokeless tobacco: Never Used  Substance and Sexual Activity  . Alcohol use: Yes    Comment: socially  . Drug use: No    Comment: last use in 10/2013  . Sexual activity: Yes    Birth control/protection: Surgical  Other Topics Concern  . Not on file  Social History Narrative  . Not on file   Social History   Tobacco Use  Smoking Status Former Smoker  . Packs/day: 0.25  . Years: 6.00  . Pack years: 1.50  . Types: Cigarettes  . Last attempt to quit: 08/10/2013  . Years since quitting: 4.3  Smokeless Tobacco Never Used   Social History   Substance and Sexual Activity  Alcohol Use Yes   Comment: socially    Family History:  Family History  Problem Relation Age of Onset  . COPD Maternal Grandmother   . Cancer Sister 25        lumpectomy for breast cancer  . Breast cancer Sister   . Cancer Sister   . Birth defects Neg Hx   . Asthma Neg Hx   . Arthritis Neg Hx   . Alcohol abuse Neg Hx   . Drug abuse Neg Hx   . Diabetes Neg Hx   . Depression Neg Hx   . Early death Neg Hx   . Hearing loss Neg Hx   . Heart disease Neg Hx   . Hyperlipidemia Neg Hx   . Hypertension Neg Hx   . Learning disabilities Neg Hx   . Kidney disease Neg Hx   . Mental illness Neg Hx   . Mental retardation Neg Hx   . Miscarriages / Stillbirths Neg Hx   . Stroke Neg Hx   . Vision loss Neg Hx     Past medical history, surgical history, medications, allergies, family history and social history reviewed with patient today and changes made to appropriate areas of the chart.   Review of Systems  Constitutional: Positive for diaphoresis. Negative for chills, fever, malaise/fatigue and weight loss.  HENT: Positive for hearing loss (chronic- nothing has changed). Negative for congestion, ear discharge, ear pain, nosebleeds, sinus pain, sore throat and tinnitus.   Eyes: Positive for blurred vision (due to see her eye doctor in June). Negative for double vision, photophobia, pain, discharge and redness.  Respiratory: Positive for cough (had the flu 2 weeks ago). Negative for hemoptysis, sputum production, shortness of breath, wheezing and stridor.   Cardiovascular: Positive for palpitations (with anxiety) and leg swelling. Negative for chest pain, orthopnea, claudication and PND.  Gastrointestinal: Positive for heartburn (daily). Negative for abdominal pain, blood in stool, constipation, diarrhea, melena, nausea and vomiting.  Genitourinary: Negative.   Musculoskeletal: Negative.   Skin: Negative for itching and rash.       Itchy scalp  Neurological: Negative.  Negative for weakness.  Endo/Heme/Allergies: Positive for polydipsia. Negative for environmental allergies. Bruises/bleeds easily.  Psychiatric/Behavioral: Negative for depression,  hallucinations, memory loss, substance abuse and suicidal ideas. The patient is nervous/anxious and has insomnia.     All other ROS negative except what is listed above and in the HPI.      Objective:    BP 120/87 (BP Location: Left Arm, Patient Position: Sitting, Cuff Size: Normal)   Pulse 98   Temp 98.6 F (37 C)   Ht 5\' 7"  (1.702 m)   Wt 217 lb (98.4 kg)   SpO2 97%   BMI 33.99 kg/m   Wt Readings from Last  3 Encounters:  12/31/17 217 lb (98.4 kg)  10/01/17 216 lb 6 oz (98.1 kg)  09/13/16 210 lb (95.3 kg)    Physical Exam  Constitutional: She is oriented to person, place, and time. She appears well-developed and well-nourished. No distress.  HENT:  Head: Normocephalic and atraumatic.  Right Ear: Hearing and external ear normal.  Left Ear: Hearing, tympanic membrane, external ear and ear canal normal.  Nose: Nose normal.  Mouth/Throat: Uvula is midline, oropharynx is clear and moist and mucous membranes are normal. No oropharyngeal exudate.  Cerumen impaction R Ear  Eyes: Conjunctivae, EOM and lids are normal. Pupils are equal, round, and reactive to light. Right eye exhibits no discharge. Left eye exhibits no discharge. No scleral icterus.  Neck: Normal range of motion. Neck supple. No JVD present. No tracheal deviation present. No thyromegaly present.  Cardiovascular: Normal rate, regular rhythm, normal heart sounds and intact distal pulses. Exam reveals no gallop and no friction rub.  No murmur heard. Pulmonary/Chest: Effort normal and breath sounds normal. No stridor. No respiratory distress. She has no wheezes. She has no rales. She exhibits no tenderness.  Abdominal: Soft. Bowel sounds are normal. She exhibits no distension and no mass. There is no tenderness. There is no rebound and no guarding.  Genitourinary:  Genitourinary Comments: Breast and pelvic exams deferred- done at GYN  Musculoskeletal: Normal range of motion. She exhibits no edema, tenderness or deformity.    Lymphadenopathy:    She has no cervical adenopathy.  Neurological: She is alert and oriented to person, place, and time. She has normal reflexes. She displays normal reflexes. No cranial nerve deficit. She exhibits normal muscle tone. Coordination normal.  Skin: Skin is warm, dry and intact. No rash noted. She is not diaphoretic. No erythema. No pallor.  Patches of scaley excoriated skin on scalp   Psychiatric: She has a normal mood and affect. Her speech is normal and behavior is normal. Judgment and thought content normal. Cognition and memory are normal.  Nursing note and vitals reviewed.   Results for orders placed or performed in visit on 10/01/17  Microscopic Examination  Result Value Ref Range   WBC, UA 0-5 0 - 5 /hpf   RBC, UA None seen 0 - 2 /hpf   Epithelial Cells (non renal) 0-10 0 - 10 /hpf   Bacteria, UA None seen None seen/Few  Bayer DCA Hb A1c Waived  Result Value Ref Range   Bayer DCA Hb A1c Waived 5.4 <7.0 %  CBC with Differential/Platelet  Result Value Ref Range   WBC 11.6 (H) 3.4 - 10.8 x10E3/uL   RBC 4.89 3.77 - 5.28 x10E6/uL   Hemoglobin 14.5 11.1 - 15.9 g/dL   Hematocrit 43.4 34.0 - 46.6 %   MCV 89 79 - 97 fL   MCH 29.7 26.6 - 33.0 pg   MCHC 33.4 31.5 - 35.7 g/dL   RDW 13.8 12.3 - 15.4 %   Platelets 288 150 - 379 x10E3/uL   Neutrophils 64 Not Estab. %   Lymphs 28 Not Estab. %   Monocytes 6 Not Estab. %   Eos 2 Not Estab. %   Basos 0 Not Estab. %   Neutrophils Absolute 7.3 (H) 1.4 - 7.0 x10E3/uL   Lymphocytes Absolute 3.3 (H) 0.7 - 3.1 x10E3/uL   Monocytes Absolute 0.6 0.1 - 0.9 x10E3/uL   EOS (ABSOLUTE) 0.3 0.0 - 0.4 x10E3/uL   Basophils Absolute 0.0 0.0 - 0.2 x10E3/uL   Immature Granulocytes 0 Not Estab. %  Immature Grans (Abs) 0.0 0.0 - 0.1 x10E3/uL  Comprehensive metabolic panel  Result Value Ref Range   Glucose 79 65 - 99 mg/dL   BUN 5 (L) 6 - 20 mg/dL   Creatinine, Ser 0.80 0.57 - 1.00 mg/dL   GFR calc non Af Amer 98 >59 mL/min/1.73   GFR  calc Af Amer 113 >59 mL/min/1.73   BUN/Creatinine Ratio 6 (L) 9 - 23   Sodium 139 134 - 144 mmol/L   Potassium 4.1 3.5 - 5.2 mmol/L   Chloride 102 96 - 106 mmol/L   CO2 22 20 - 29 mmol/L   Calcium 8.8 8.7 - 10.2 mg/dL   Total Protein 7.4 6.0 - 8.5 g/dL   Albumin 4.9 3.5 - 5.5 g/dL   Globulin, Total 2.5 1.5 - 4.5 g/dL   Albumin/Globulin Ratio 2.0 1.2 - 2.2   Bilirubin Total 0.2 0.0 - 1.2 mg/dL   Alkaline Phosphatase 55 39 - 117 IU/L   AST 16 0 - 40 IU/L   ALT 21 0 - 32 IU/L  Lipid Panel w/o Chol/HDL Ratio  Result Value Ref Range   Cholesterol, Total 181 100 - 199 mg/dL   Triglycerides 89 0 - 149 mg/dL   HDL 49 >39 mg/dL   VLDL Cholesterol Cal 18 5 - 40 mg/dL   LDL Calculated 114 (H) 0 - 99 mg/dL  Microalbumin, Urine Waived  Result Value Ref Range   Microalb, Ur Waived 30 (H) 0 - 19 mg/L   Creatinine, Urine Waived 300 10 - 300 mg/dL   Microalb/Creat Ratio <30 <30 mg/g  TSH  Result Value Ref Range   TSH 2.740 0.450 - 4.500 uIU/mL  UA/M w/rflx Culture, Routine  Result Value Ref Range   Specific Gravity, UA 1.025 1.005 - 1.030   pH, UA 6.0 5.0 - 7.5   Color, UA Yellow Yellow   Appearance Ur Cloudy (A) Clear   Leukocytes, UA Trace (A) Negative   Protein, UA Negative Negative/Trace   Glucose, UA Negative Negative   Ketones, UA Negative Negative   RBC, UA Negative Negative   Bilirubin, UA Negative Negative   Urobilinogen, Ur 0.2 0.2 - 1.0 mg/dL   Nitrite, UA Negative Negative   Microscopic Examination See below:       Assessment & Plan:   Problem List Items Addressed This Visit      Digestive   Gastroesophageal reflux disease    Will start omeprazole. Recheck 1 month.       Relevant Medications   omeprazole (PRILOSEC) 20 MG capsule     Other   Bipolar disorder ([place])    Stable off medicine. Call with any concerns.       Anxiety    Encouraged patient to pick up buspar. Call with any concerns.       History of anaphylaxis    Epipen written today.          Other Visit Diagnoses    Routine general medical examination at a health care facility    -  Primary   Vaccines up to date. Screening labs checked last visit- will recheck a couple due to hot flashes. Pap done at GYN. Call with any concerns.    Relevant Orders   CBC with Differential/Platelet   TSH   Comprehensive metabolic panel   Seborrheic dermatitis of scalp       Will treat with ketoconazole shampoo. Call with any concerns.        Follow up plan: Return in about  4 weeks (around 01/28/2018) for follow up scalp and GERD.   LABORATORY TESTING:  - Pap smear: done elsewhere  IMMUNIZATIONS:   - Tdap: Tetanus vaccination status reviewed: last tetanus booster within 10 years. - Influenza: Up to date  PATIENT COUNSELING:   Advised to take 1 mg of folate supplement per day if capable of pregnancy.   Sexuality: Discussed sexually transmitted diseases, partner selection, use of condoms, avoidance of unintended pregnancy  and contraceptive alternatives.   Advised to avoid cigarette smoking.  I discussed with the patient that most people either abstain from alcohol or drink within safe limits (<=14/week and <=4 drinks/occasion for males, <=7/weeks and <= 3 drinks/occasion for females) and that the risk for alcohol disorders and other health effects rises proportionally with the number of drinks per week and how often a drinker exceeds daily limits.  Discussed cessation/primary prevention of drug use and availability of treatment for abuse.   Diet: Encouraged to adjust caloric intake to maintain  or achieve ideal body weight, to reduce intake of dietary saturated fat and total fat, to limit sodium intake by avoiding high sodium foods and not adding table salt, and to maintain adequate dietary potassium and calcium preferably from fresh fruits, vegetables, and low-fat dairy products.    stressed the importance of regular exercise  Injury prevention: Discussed safety belts, safety helmets,  smoke detector, smoking near bedding or upholstery.   Dental health: Discussed importance of regular tooth brushing, flossing, and dental visits.    NEXT PREVENTATIVE PHYSICAL DUE IN 1 YEAR. Return in about 4 weeks (around 01/28/2018) for follow up scalp and GERD.

## 2017-12-31 NOTE — Patient Instructions (Addendum)
Health Maintenance, Female Adopting a healthy lifestyle and getting preventive care can go a long way to promote health and wellness. Talk with your health care provider about what schedule of regular examinations is right for you. This is a good chance for you to check in with your provider about disease prevention and staying healthy. In between checkups, there are plenty of things you can do on your own. Experts have done a lot of research about which lifestyle changes and preventive measures are most likely to keep you healthy. Ask your health care provider for more information. Weight and diet Eat a healthy diet  Be sure to include plenty of vegetables, fruits, low-fat dairy products, and lean protein.  Do not eat a lot of foods high in solid fats, added sugars, or salt.  Get regular exercise. This is one of the most important things you can do for your health. ? Most adults should exercise for at least 150 minutes each week. The exercise should increase your heart rate and make you sweat (moderate-intensity exercise). ? Most adults should also do strengthening exercises at least twice a week. This is in addition to the moderate-intensity exercise.  Maintain a healthy weight  Body mass index (BMI) is a measurement that can be used to identify possible weight problems. It estimates body fat based on height and weight. Your health care provider can help determine your BMI and help you achieve or maintain a healthy weight.  For females 20 years of age and older: ? A BMI below 18.5 is considered underweight. ? A BMI of 18.5 to 24.9 is normal. ? A BMI of 25 to 29.9 is considered overweight. ? A BMI of 30 and above is considered obese.  Watch levels of cholesterol and blood lipids  You should start having your blood tested for lipids and cholesterol at 33 years of age, then have this test every 5 years.  You may need to have your cholesterol levels checked more often if: ? Your lipid or  cholesterol levels are high. ? You are older than 33 years of age. ? You are at high risk for heart disease.  Cancer screening Lung Cancer  Lung cancer screening is recommended for adults 55-80 years old who are at high risk for lung cancer because of a history of smoking.  A yearly low-dose CT scan of the lungs is recommended for people who: ? Currently smoke. ? Have quit within the past 15 years. ? Have at least a 30-pack-year history of smoking. A pack year is smoking an average of one pack of cigarettes a day for 1 year.  Yearly screening should continue until it has been 15 years since you quit.  Yearly screening should stop if you develop a health problem that would prevent you from having lung cancer treatment.  Breast Cancer  Practice breast self-awareness. This means understanding how your breasts normally appear and feel.  It also means doing regular breast self-exams. Let your health care provider know about any changes, no matter how small.  If you are in your 20s or 30s, you should have a clinical breast exam (CBE) by a health care provider every 1-3 years as part of a regular health exam.  If you are 40 or older, have a CBE every year. Also consider having a breast X-ray (mammogram) every year.  If you have a family history of breast cancer, talk to your health care provider about genetic screening.  If you are at high risk   for breast cancer, talk to your health care provider about having an MRI and a mammogram every year.  Breast cancer gene (BRCA) assessment is recommended for women who have family members with BRCA-related cancers. BRCA-related cancers include: ? Breast. ? Ovarian. ? Tubal. ? Peritoneal cancers.  Results of the assessment will determine the need for genetic counseling and BRCA1 and BRCA2 testing.  Cervical Cancer Your health care provider may recommend that you be screened regularly for cancer of the pelvic organs (ovaries, uterus, and  vagina). This screening involves a pelvic examination, including checking for microscopic changes to the surface of your cervix (Pap test). You may be encouraged to have this screening done every 3 years, beginning at age 22.  For women ages 56-65, health care providers may recommend pelvic exams and Pap testing every 3 years, or they may recommend the Pap and pelvic exam, combined with testing for human papilloma virus (HPV), every 5 years. Some types of HPV increase your risk of cervical cancer. Testing for HPV may also be done on women of any age with unclear Pap test results.  Other health care providers may not recommend any screening for nonpregnant women who are considered low risk for pelvic cancer and who do not have symptoms. Ask your health care provider if a screening pelvic exam is right for you.  If you have had past treatment for cervical cancer or a condition that could lead to cancer, you need Pap tests and screening for cancer for at least 20 years after your treatment. If Pap tests have been discontinued, your risk factors (such as having a new sexual partner) need to be reassessed to determine if screening should resume. Some women have medical problems that increase the chance of getting cervical cancer. In these cases, your health care provider may recommend more frequent screening and Pap tests.  Colorectal Cancer  This type of cancer can be detected and often prevented.  Routine colorectal cancer screening usually begins at 33 years of age and continues through 33 years of age.  Your health care provider may recommend screening at an earlier age if you have risk factors for colon cancer.  Your health care provider may also recommend using home test kits to check for hidden blood in the stool.  A small camera at the end of a tube can be used to examine your colon directly (sigmoidoscopy or colonoscopy). This is done to check for the earliest forms of colorectal  cancer.  Routine screening usually begins at age 33.  Direct examination of the colon should be repeated every 5-10 years through 33 years of age. However, you may need to be screened more often if early forms of precancerous polyps or small growths are found.  Skin Cancer  Check your skin from head to toe regularly.  Tell your health care provider about any new moles or changes in moles, especially if there is a change in a mole's shape or color.  Also tell your health care provider if you have a mole that is larger than the size of a pencil eraser.  Always use sunscreen. Apply sunscreen liberally and repeatedly throughout the day.  Protect yourself by wearing long sleeves, pants, a wide-brimmed hat, and sunglasses whenever you are outside.  Heart disease, diabetes, and high blood pressure  High blood pressure causes heart disease and increases the risk of stroke. High blood pressure is more likely to develop in: ? People who have blood pressure in the high end of  the normal range (130-139/85-89 mm Hg). ? People who are overweight or obese. ? People who are African American.  If you are 21-29 years of age, have your blood pressure checked every 3-5 years. If you are 3 years of age or older, have your blood pressure checked every year. You should have your blood pressure measured twice-once when you are at a hospital or clinic, and once when you are not at a hospital or clinic. Record the average of the two measurements. To check your blood pressure when you are not at a hospital or clinic, you can use: ? An automated blood pressure machine at a pharmacy. ? A home blood pressure monitor.  If you are between 17 years and 37 years old, ask your health care provider if you should take aspirin to prevent strokes.  Have regular diabetes screenings. This involves taking a blood sample to check your fasting blood sugar level. ? If you are at a normal weight and have a low risk for diabetes,  have this test once every three years after 33 years of age. ? If you are overweight and have a high risk for diabetes, consider being tested at a younger age or more often. Preventing infection Hepatitis B  If you have a higher risk for hepatitis B, you should be screened for this virus. You are considered at high risk for hepatitis B if: ? You were born in a country where hepatitis B is common. Ask your health care provider which countries are considered high risk. ? Your parents were born in a high-risk country, and you have not been immunized against hepatitis B (hepatitis B vaccine). ? You have HIV or AIDS. ? You use needles to inject street drugs. ? You live with someone who has hepatitis B. ? You have had sex with someone who has hepatitis B. ? You get hemodialysis treatment. ? You take certain medicines for conditions, including cancer, organ transplantation, and autoimmune conditions.  Hepatitis C  Blood testing is recommended for: ? Everyone born from [birth year] through 1966. ? Anyone with known risk factors for hepatitis C.  Sexually transmitted infections (STIs)  You should be screened for sexually transmitted infections (STIs) including gonorrhea and chlamydia if: ? You are sexually active and are younger than 33 years of age. ? You are older than 33 years of age and your health care provider tells you that you are at risk for this type of infection. ? Your sexual activity has changed since you were last screened and you are at an increased risk for chlamydia or gonorrhea. Ask your health care provider if you are at risk.  If you do not have HIV, but are at risk, it may be recommended that you take a prescription medicine daily to prevent HIV infection. This is called pre-exposure prophylaxis (PrEP). You are considered at risk if: ? You are sexually active and do not regularly use condoms or know the HIV status of your partner(s). ? You take drugs by injection. ? You are  sexually active with a partner who has HIV.  Talk with your health care provider about whether you are at high risk of being infected with HIV. If you choose to begin PrEP, you should first be tested for HIV. You should then be tested every 3 months for as long as you are taking PrEP. Pregnancy  If you are premenopausal and you may become pregnant, ask your health care provider about preconception counseling.  If you may become  pregnant, take 400 to 800 micrograms (mcg) of folic acid every day.  If you want to prevent pregnancy, talk to your health care provider about birth control (contraception). Osteoporosis and menopause  Osteoporosis is a disease in which the bones lose minerals and strength with aging. This can result in serious bone fractures. Your risk for osteoporosis can be identified using a bone density scan.  If you are 35 years of age or older, or if you are at risk for osteoporosis and fractures, ask your health care provider if you should be screened.  Ask your health care provider whether you should take a calcium or vitamin D supplement to lower your risk for osteoporosis.  Menopause may have certain physical symptoms and risks.  Hormone replacement therapy may reduce some of these symptoms and risks. Talk to your health care provider about whether hormone replacement therapy is right for you. Follow these instructions at home:  Schedule regular health, dental, and eye exams.  Stay current with your immunizations.  Do not use any tobacco products including cigarettes, chewing tobacco, or electronic cigarettes.  If you are pregnant, do not drink alcohol.  If you are breastfeeding, limit how much and how often you drink alcohol.  Limit alcohol intake to no more than 1 drink per day for nonpregnant women. One drink equals 12 ounces of beer, 5 ounces of wine, or 1 ounces of hard liquor.  Do not use street drugs.  Do not share needles.  Ask your health care  provider for help if you need support or information about quitting drugs.  Tell your health care provider if you often feel depressed.  Tell your health care provider if you have ever been abused or do not feel safe at home. This information is not intended to replace advice given to you by your health care provider. Make sure you discuss any questions you have with your health care provider. Document Released: 04/16/2011 Document Revised: 03/08/2016 Document Reviewed: 07/05/2015 Elsevier Interactive Patient Education  2018 [employer].  Peripheral Edema Peripheral edema is swelling that is caused by a buildup of fluid. Peripheral edema most often affects the lower legs, ankles, and feet. It can also develop in the arms, hands, and face. The area of the body that has peripheral edema will look swollen. It may also feel heavy or warm. Your clothes may start to feel tight. Pressing on the area may make a temporary dent in your skin. You may not be able to move your arm or leg as much as usual. There are many causes of peripheral edema. It can be a complication of other diseases, such as congestive heart failure, kidney disease, or a problem with your blood circulation. It also can be a side effect of certain medicines. It often happens to women during pregnancy. Sometimes, the cause is not known. Treating the underlying condition is often the only treatment for peripheral edema. Follow these instructions at home: Pay attention to any changes in your symptoms. Take these actions to help with your discomfort:  Raise (elevate) your legs while you are sitting or lying down.  Move around often to prevent stiffness and to lessen swelling. Do not sit or stand for long periods of time.  Wear support stockings as told by your health care provider.  Follow instructions from your health care provider about limiting salt (sodium) in your diet. Sometimes eating less salt can reduce swelling.  Take  over-the-counter and prescription medicines only as told by your health  care provider. Your health care provider may prescribe medicine to help your body get rid of excess water (diuretic).  Keep all follow-up visits as told by your health care provider. This is important.  Contact a health care provider if:  You have a fever.  Your edema starts suddenly or is getting worse, especially if you are pregnant or have a medical condition.  You have swelling in only one leg.  You have increased swelling and pain in your legs. Get help right away if:  You develop shortness of breath, especially when you are lying down.  You have pain in your chest or abdomen.  You feel weak.  You faint. This information is not intended to replace advice given to you by your health care provider. Make sure you discuss any questions you have with your health care provider. Document Released: 11/08/2004 Document Revised: 03/05/2016 Document Reviewed: 04/13/2015 Elsevier Interactive Patient Education  [employer].

## 2017-12-31 NOTE — Assessment & Plan Note (Signed)
Stable off medicine. Call with any concerns.

## 2017-12-31 NOTE — Assessment & Plan Note (Signed)
Encouraged patient to pick up buspar. Call with any concerns.

## 2017-12-31 NOTE — Assessment & Plan Note (Signed)
Epipen written today.

## 2017-12-31 NOTE — Assessment & Plan Note (Signed)
Will start omeprazole. Recheck 1 month.

## 2018-01-01 LAB — CBC WITH DIFFERENTIAL/PLATELET
Basophils Absolute: 0.1 10*3/uL (ref 0.0–0.2)
Basos: 1 %
EOS (ABSOLUTE): 0.3 10*3/uL (ref 0.0–0.4)
Eos: 3 %
HEMOGLOBIN: 14 g/dL (ref 11.1–15.9)
Hematocrit: 41.9 % (ref 34.0–46.6)
IMMATURE GRANULOCYTES: 1 %
Immature Grans (Abs): 0.1 10*3/uL (ref 0.0–0.1)
LYMPHS ABS: 2.3 10*3/uL (ref 0.7–3.1)
Lymphs: 23 %
MCH: 29.2 pg (ref 26.6–33.0)
MCHC: 33.4 g/dL (ref 31.5–35.7)
MCV: 88 fL (ref 79–97)
MONOCYTES: 6 %
Monocytes Absolute: 0.6 10*3/uL (ref 0.1–0.9)
NEUTROS PCT: 66 %
Neutrophils Absolute: 7 10*3/uL (ref 1.4–7.0)
Platelets: 274 10*3/uL (ref 150–379)
RBC: 4.79 x10E6/uL (ref 3.77–5.28)
RDW: 13.4 % (ref 12.3–15.4)
WBC: 10.3 10*3/uL (ref 3.4–10.8)

## 2018-01-01 LAB — COMPREHENSIVE METABOLIC PANEL
A/G RATIO: 1.8 (ref 1.2–2.2)
ALBUMIN: 4.3 g/dL (ref 3.5–5.5)
ALK PHOS: 47 IU/L (ref 39–117)
ALT: 27 IU/L (ref 0–32)
AST: 15 IU/L (ref 0–40)
BUN / CREAT RATIO: 9 (ref 9–23)
BUN: 8 mg/dL (ref 6–20)
CHLORIDE: 106 mmol/L (ref 96–106)
CO2: 20 mmol/L (ref 20–29)
CREATININE: 0.87 mg/dL (ref 0.57–1.00)
Calcium: 8.7 mg/dL (ref 8.7–10.2)
GFR calc Af Amer: 102 mL/min/{1.73_m2} (ref >59)
GFR calc non Af Amer: 88 mL/min/{1.73_m2} (ref >59)
GLOBULIN, TOTAL: 2.4 g/dL (ref 1.5–4.5)
Glucose: 100 mg/dL — ABNORMAL HIGH (ref 65–99)
POTASSIUM: 4.2 mmol/L (ref 3.5–5.2)
SODIUM: 141 mmol/L (ref 134–144)
Total Protein: 6.7 g/dL (ref 6.0–8.5)

## 2018-01-01 LAB — TSH: TSH: 1.5 u[IU]/mL (ref 0.450–4.500)

## 2018-02-10 ENCOUNTER — Telehealth: Payer: Self-pay | Admitting: Family Medicine

## 2018-02-10 NOTE — Telephone Encounter (Signed)
Patient notified

## 2018-02-10 NOTE — Telephone Encounter (Signed)
Copied from [place] [phone]. Topic: Appointment Scheduling - Scheduling Inquiry for Clinic >> Feb 10, 2018  2:11 PM Boyd Kerbs wrote: Reason for CRM:   pt. Is asking if she needs to come back in. Everything has been ok.   >> Feb 10, 2018  3:35 PM Shaune Pollack wrote: Please advise if patient needs to follow up. See message from patient

## 2018-02-10 NOTE — Telephone Encounter (Signed)
OK to come back in 6 months and cancel current appointment

## 2018-02-13 ENCOUNTER — Ambulatory Visit: Payer: BLUE CROSS/BLUE SHIELD | Admitting: Family Medicine

## 2018-03-06 ENCOUNTER — Emergency Department: Payer: BLUE CROSS/BLUE SHIELD

## 2018-03-06 ENCOUNTER — Other Ambulatory Visit: Payer: Self-pay

## 2018-03-06 ENCOUNTER — Encounter: Payer: Self-pay | Admitting: *Deleted

## 2018-03-06 ENCOUNTER — Emergency Department
Admission: EM | Admit: 2018-03-06 | Discharge: 2018-03-06 | Disposition: A | Discharge status: Home / Self Care | Payer: BLUE CROSS/BLUE SHIELD | Attending: Emergency Medicine | Admitting: Emergency Medicine

## 2018-03-06 DIAGNOSIS — I880 Nonspecific mesenteric lymphadenitis: Secondary | ICD-10-CM | POA: Insufficient documentation

## 2018-03-06 DIAGNOSIS — R1031 Right lower quadrant pain: Secondary | ICD-10-CM

## 2018-03-06 DIAGNOSIS — Z87891 Personal history of nicotine dependence: Secondary | ICD-10-CM | POA: Insufficient documentation

## 2018-03-06 DIAGNOSIS — K5900 Constipation, unspecified: Secondary | ICD-10-CM

## 2018-03-06 LAB — COMPREHENSIVE METABOLIC PANEL
ALK PHOS: 41 U/L (ref 38–126)
ALT: 23 U/L (ref 14–54)
AST: 23 U/L (ref 15–41)
Albumin: 4.1 g/dL (ref 3.5–5.0)
Anion gap: 6 (ref 5–15)
BUN: 13 mg/dL (ref 6–20)
CALCIUM: 8.6 mg/dL — AB (ref 8.9–10.3)
CHLORIDE: 106 mmol/L (ref 101–111)
CO2: 26 mmol/L (ref 22–32)
CREATININE: 0.86 mg/dL (ref 0.44–1.00)
GFR calc non Af Amer: >60 mL/min (ref >60)
Glucose, Bld: 93 mg/dL (ref 65–99)
Potassium: 4 mmol/L (ref 3.5–5.1)
SODIUM: 138 mmol/L (ref 135–145)
Total Bilirubin: 0.5 mg/dL (ref 0.3–1.2)
Total Protein: 7 g/dL (ref 6.5–8.1)

## 2018-03-06 LAB — CBC
HEMATOCRIT: 39.3 % (ref 35.0–47.0)
Hemoglobin: 13.9 g/dL (ref 12.0–16.0)
MCH: 30.9 pg (ref 26.0–34.0)
MCHC: 35.5 g/dL (ref 32.0–36.0)
MCV: 87.2 fL (ref 80.0–100.0)
Platelets: 233 10*3/uL (ref 150–440)
RBC: 4.51 MIL/uL (ref 3.80–5.20)
RDW: 13.5 % (ref 11.5–14.5)
WBC: 11 10*3/uL (ref 3.6–11.0)

## 2018-03-06 LAB — LIPASE, BLOOD: Lipase: 31 U/L (ref 11–51)

## 2018-03-06 LAB — POC URINE PREG, ED: PREG TEST UR: NEGATIVE

## 2018-03-06 MED ORDER — SENNOSIDES-DOCUSATE SODIUM 8.6-50 MG PO TABS: 2.0000 | ORAL_TABLET | Freq: Every day | ORAL | 0 refills | Status: AC | PRN

## 2018-03-06 MED ORDER — ACETAMINOPHEN 500 MG PO TABS
1000.0000 mg | ORAL_TABLET | Freq: Once | ORAL | Status: AC
Start: 2018-03-06 — End: 2018-03-06
  Administered 2018-03-06: 1000 mg via ORAL
  Filled 2018-03-06: qty 2

## 2018-03-06 MED ORDER — SIMETHICONE 80 MG PO CHEW: 80.0000 mg | CHEWABLE_TABLET | Freq: Four times a day (QID) | ORAL | 0 refills | Status: DC | PRN

## 2018-03-06 NOTE — ED Triage Notes (Signed)
Pt reports a sharp pain in the LRQ of abd and right side of pelvis. Pt reports a hx of the same pain but this time it has lasted three weeks. Right leg is reported to go numb at times. No vaginal discharge or bleeding. No NVD reported but pt has been constipated recently and reports last BM was today but was not normal and she had to strain.   Pain is worse when walking but is not worse with pressure.

## 2018-03-06 NOTE — Discharge Instructions (Signed)
To prevent constipation, take a high-fiber diet, get plenty of exercise, and drink plenty of water.  You may take the stool softener, as well as Gas-X for your symptoms.  Return to the emergency department if you develop severe pain, lightheadedness or fainting, fever, inability to keep down fluids, or any other symptoms concerning to you.

## 2018-03-06 NOTE — ED Notes (Signed)
Pt amb to room 34 without difficulty or distress noted; pt reports right lower abd pain "for awhile", worse last 3wks with no accomp symptoms; pt does report recent constipation with hard stool noted today; has not taken any OTC meds for relief but did take aleve without change; +BS, abd soft/nondist/nontender; pt reports that she is unsure if it is her ovaries or constipation

## 2018-03-06 NOTE — ED Notes (Addendum)
Called lab regarding pending lab results, spoke with Barnabas Harries who st "never received specimens"; MD notified

## 2018-03-06 NOTE — ED Notes (Signed)
Pt to CT via stretcher accomp by CT tech 

## 2018-03-06 NOTE — ED Notes (Addendum)
Lab calls to report blood sent hemolyzed; MD notified

## 2018-03-06 NOTE — ED Provider Notes (Signed)
[facility] Emergency Department Provider Note  ____________________________________________  Time seen: Approximately 7:40 PM  I have reviewed the triage vital signs and the nursing notes.   HISTORY  Chief Complaint Abdominal Pain    HPI Joan Heath is a 32 y.o. female s/p remote endometrial ablation, BTL, appendectomy, cholecystectomy, presenting with right lower quadrant pain.  The patient reports that for the last several months, she has had an intermittent right lower quadrant pain which she attributed to constipation and gas.  However, over the last 3 weeks, the pain has become constant and daily.  She has had no associated nausea or vomiting, diarrhea.  She does continue to have intermittent constipation and has to strain for bowel movements.  She has not had any fevers or chills, change in vaginal discharge.  She is tried Aleve for her pain which has not significantly improved her pain.  Past Medical History:  Diagnosis Date  . Anxiety    no meds  . Bipolar 1 disorder (HCC)    no meds currently  . Chlamydia infection 01/12/2015  . GERD (gastroesophageal reflux disease)    occasional - diet controlled  . Headache    migraines - otc meds prn    Patient Active Problem List   Diagnosis Date Noted  . History of anaphylaxis 12/31/2017  . Gastroesophageal reflux disease 12/31/2017  . Menorrhagia 03/09/2015  . S/P HTA endometrial ablation on 03/09/15 03/09/2015  . FH: breast cancer in sister diagnosed at age 81 01/12/2015  . Muscle spasm 09/22/2013  . Migraine without aura 08/18/2013  . Anxiety 08/18/2013  . Marijuana use 08/18/2013  . Bipolar disorder ([place]) 06/25/2013    Past Surgical History:  Procedure Laterality Date  . APPENDECTOMY    . 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 CESAREAN SECTION  2007  . CESAREAN SECTION WITH BILATERAL TUBAL LIGATION N/A 01/22/2014   Procedure: CESAREAN SECTION WITH BILATERAL TUBAL LIGATION;  Surgeon:  Truett Mainland, DO;  Location: [facility];  Service: Gynecology;  Laterality: N/A;  . CHOLECYSTECTOMY    . DILITATION & CURRETTAGE/HYSTROSCOPY WITH HYDROTHERMAL ABLATION N/A 03/09/2015   Procedure: HYSTEROSCOPY WITH HYDROTHERMAL ABLATION;  Surgeon: Osborne Oman, MD;  Location: [facility];  Service: Gynecology;  Laterality: N/A;  . TUBAL LIGATION    . WISDOM TOOTH EXTRACTION      Current Outpatient Rx  . Order #: [id number] Class: Normal  . Order #: [id number] Class: Normal  . Order #: [id number] Class: Normal  . Order #: [id number] Class: Print  . Order #: [id number] Class: Print    Allergies Peanuts [peanut oil] and Cinnamon  Family History  Problem Relation Age of Onset  . COPD Maternal Grandmother   . Cancer Sister 25       lumpectomy for breast cancer  . Breast cancer Sister   . Cancer Sister   . Birth defects Neg Hx   . Asthma Neg Hx   . Arthritis Neg Hx   . Alcohol abuse Neg Hx   . Drug abuse Neg Hx   . Diabetes Neg Hx   . Depression Neg Hx   . Early death Neg Hx   . Hearing loss Neg Hx   . Heart disease Neg Hx   . Hyperlipidemia Neg Hx   . Hypertension Neg Hx   . Learning disabilities Neg Hx   . Kidney disease Neg Hx   . Mental illness Neg Hx   . Mental retardation Neg Hx   . Miscarriages / [place]  Neg Hx   . Stroke Neg Hx   . Vision loss Neg Hx     Social History Social History   Tobacco Use  . Smoking status: Former Smoker    Packs/day: 0.25    Years: 6.00    Pack years: 1.50    Types: Cigarettes    Last attempt to quit: 08/10/2013    Years since quitting: 4.5  . Smokeless tobacco: Never Used  Substance Use Topics  . Alcohol use: Yes    Comment: socially  . Drug use: No    Types: Marijuana    Comment: last use in 10/2013    Review of Systems Constitutional: No fever/chills.  Lightheadedness or syncope. Eyes: No visual changes. ENT: No sore throat. No congestion or rhinorrhea. Cardiovascular: Denies chest pain. Denies palpitations. Respiratory:  Denies shortness of breath.  No cough. Gastrointestinal: + abdominal pain.  No nausea, no vomiting.  No diarrhea.  + constipation. Genitourinary: Negative for dysuria.  No urinary frequency.  No change in vaginal discharge. Musculoskeletal: Negative for back pain. Skin: Negative for rash. Neurological: Negative for headaches. No focal numbness, tingling or weakness.     ____________________________________________   PHYSICAL EXAM:  VITAL SIGNS: ED Triage Vitals  Enc Vitals Group     BP 03/06/18 1850 (!) 152/89     Pulse Rate 03/06/18 1850 85     Resp 03/06/18 1850 16     Temp 03/06/18 1850 98.8 F (37.1 C)     Temp Source 03/06/18 1850 Oral     SpO2 03/06/18 1850 93 %     Weight 03/06/18 1843 216 lb (98 kg)     Height 03/06/18 1843 5\' 6"  (1.676 m)     Head Circumference --      Peak Flow --      Pain Score 03/06/18 1843 8     Pain Loc --      Pain Edu? --      Excl. in [place]? --     Constitutional: Alert and oriented. Well appearing and in no acute distress. Answers questions appropriately.  The patient sits comfortably on the stretcher and is able to move about the room without any difficulty. Eyes: Conjunctivae are normal.  EOMI. No scleral icterus. Head: Atraumatic. Nose: No congestion/rhinnorhea. Mouth/Throat: Mucous membranes are moist.  Neck: No stridor.  Supple.  No JVD.  No meningismus. Cardiovascular: Normal rate, regular rhythm. No murmurs, rubs or gallops.  Respiratory: Normal respiratory effort.  No accessory muscle use or retractions. Lungs CTAB.  No wheezes, rales or ronchi. Gastrointestinal: Soft, and nondistended.  Tender to palpation in the right lower quadrant.  On the medial aspect of the right lower quadrant, there is a palpable indurated mass that is mobile.  No guarding or rebound.  No peritoneal signs. Musculoskeletal: No LE edema. No ttp in the calves or palpable cords.  Negative Homan's sign. Neurologic:  A&Ox3.  Speech is clear.  Face and smile are  symmetric.  EOMI.  Moves all extremities well. Skin:  Skin is warm, dry and intact. No rash noted. Psychiatric: Mood and affect are normal. Speech and behavior are normal.  Normal judgement.  ____________________________________________   LABS (all labs ordered are listed, but only abnormal results are displayed)  Labs Reviewed  CBC  URINALYSIS, COMPLETE (UACMP) WITH MICROSCOPIC  COMPREHENSIVE METABOLIC PANEL  LIPASE, BLOOD  POC URINE PREG, ED   ____________________________________________  EKG  Not indicated ____________________________________________  RADIOLOGY  Ct Abdomen Pelvis Wo Contrast  Result Date:  03/06/2018 CLINICAL DATA:  Right lower quadrant abdominal pain and pelvis x3 weeks. EXAM: CT ABDOMEN AND PELVIS WITHOUT CONTRAST TECHNIQUE: Multidetector CT imaging of the abdomen and pelvis was performed following the standard protocol without IV contrast. COMPARISON:  None. FINDINGS: Lower chest: No acute abnormality. Hepatobiliary: No focal liver abnormality is seen. Status post cholecystectomy. No biliary dilatation. Pancreas: Unremarkable. No pancreatic ductal dilatation or surrounding inflammatory changes. Spleen: Normal in size without focal abnormality. Adrenals/Urinary Tract: Normal bilateral adrenal glands. Punctate calcification in the lower pole the left kidney. No hydroureteronephrosis. No mass of the kidneys given limitations of a noncontrast study. The urinary bladder is unremarkable and is free of stones. Stomach/Bowel: Nondistended stomach with normal small bowel rotation and ligament of Treitz position. No inflammation or bowel obstruction. No significant stool retention. No pericecal inflammation is identified. The appendix is not confidently identified however no pericecal inflammatory change is seen. A few small mesenteric lymph nodes are seen in the right lower quadrant. Vascular/Lymphatic: No significant vascular findings are present. No enlarged abdominal or  pelvic lymph nodes. Reproductive: Bilateral tubal ligations. Unremarkable uterus and adnexa. Other: No free air nor free fluid. Small umbilical fat containing hernia. Musculoskeletal: No acute or significant osseous findings. IMPRESSION: 1. No acute bowel inflammation or obstruction. The appendix is not confidently identified however no pericecal inflammation to suggest acute appendicitis. 2. Small right lower quadrant mesenteric lymph nodes are present which may reflect a mild mesenteric adenitis. Otherwise negative exam. Electronically Signed   By: Ashley Royalty M.D.   On: 03/06/2018 21:30    ____________________________________________   PROCEDURES  Procedure(s) performed: None  Procedures  Critical Care performed: No ____________________________________________   INITIAL IMPRESSION / ASSESSMENT AND PLAN / ED COURSE  Pertinent labs & imaging results that were available during my care of the patient were reviewed by me and considered in my medical decision making (see chart for details).  32 y.o. female with a history of multiple abdominal surgeries, nonpregnant, presenting with right lower quadrant discomfort facility for several months, now worse over the last 3 weeks, in the setting of constipation.  Overall, the patient is hemodynamically stable and afebrile.  Her white blood cell count is reassuring.  I do not suspect an acute infectious process as she has been having symptoms for months and has no other subjective or objective measures of infection including diarrhea, nausea or vomiting, or fever.  I would consider constipation or gases the most likely etiology for the patient's symptoms and she does state that she does not eat a high-fiber diet and often strains for bowel movements.  A GU pathology including ovarian cyst is also possible but again the length of the patient's symptoms make this less likely.  Ovarian torsion is very unlikely.  A vascular cause including aortic pathology is  also very unlikely.  We will get a CT scan of the patient's abdomen due to her focal pain, and reevaluate her for final disposition.  ----------------------------------------- 9:57 PM on 03/06/2018 -----------------------------------------  The patient has a CT scan which is reassuring.  There are some mesenteric lymph nodes which are mildly enlarged, so she may have a degree of mesenteric adenitis but again she has no clinical symptoms of an acute severe infection.  At this time, the patient is feeling well, is able to tolerate liquids, and will be discharged home in stable condition.  Follow-up instructions as well as return precautions were discussed.  ____________________________________________  FINAL CLINICAL IMPRESSION(S) / ED DIAGNOSES  Final diagnoses:  Right lower quadrant pain  Constipation, unspecified constipation type  Nonspecific mesenteric adenitis         NEW MEDICATIONS STARTED DURING THIS VISIT:  New Prescriptions   SENNA-DOCUSATE (SENOKOT-S) 8.6-50 MG TABLET    Take 2 tablets by mouth daily as needed for mild constipation.   SIMETHICONE (GAS-X) 80 MG CHEWABLE TABLET    Chew 1 tablet (80 mg total) by mouth 4 (four) times daily as needed for flatulence.      Eula Listen, MD 03/06/18 2158

## 2018-03-06 NOTE — ED Notes (Signed)
ED Provider at bedside. 

## 2018-07-21 ENCOUNTER — Encounter: Payer: Self-pay | Admitting: Family Medicine

## 2018-07-21 DIAGNOSIS — N63 Unspecified lump in unspecified breast: Secondary | ICD-10-CM

## 2018-07-22 ENCOUNTER — Other Ambulatory Visit: Payer: Self-pay | Admitting: Family Medicine

## 2018-07-22 DIAGNOSIS — N63 Unspecified lump in unspecified breast: Secondary | ICD-10-CM

## 2018-07-22 NOTE — Telephone Encounter (Signed)
I have orders in. I don't think they want a regular screening as she's having an issue- can we check what they want?

## 2018-07-30 ENCOUNTER — Ambulatory Visit
Admission: RE | Admit: 2018-07-30 | Discharge: 2018-07-30 | Disposition: A | Discharge status: Home / Self Care | Payer: Managed Care, Other (non HMO) | Source: Ambulatory Visit | Attending: Family Medicine | Admitting: Family Medicine

## 2018-07-30 DIAGNOSIS — N63 Unspecified lump in unspecified breast: Secondary | ICD-10-CM

## 2019-04-02 ENCOUNTER — Telehealth: Payer: Managed Care, Other (non HMO) | Admitting: Family

## 2019-04-02 ENCOUNTER — Encounter: Payer: Self-pay | Admitting: Family

## 2019-04-02 DIAGNOSIS — J3089 Other allergic rhinitis: Secondary | ICD-10-CM | POA: Diagnosis not present

## 2019-04-02 MED ORDER — FLUTICASONE PROPIONATE 50 MCG/ACT NA SUSP: 1.0000 | Freq: Two times a day (BID) | NASAL | 6 refills | Status: DC

## 2019-04-02 NOTE — Progress Notes (Signed)
Greater than 5 minutes, yet less than 10 minutes of time have been spent researching, coordinating, and implementing care for this patient today.  Thank you for the details you included in the comment boxes. Those details are very helpful in determining the best course of treatment for you and help [place] to provide the best care.  E visit for Allergic Rhinitis We are sorry that you are not feeling well.  Here is how we plan to help!  Based on what you have shared with me it looks like you have Allergic Rhinitis.  Rhinitis is when a reaction occurs that causes nasal congestion, runny nose, sneezing, and itching.  Most types of rhinitis are caused by an inflammation and are associated with symptoms in the eyes ears or throat. There are several types of rhinitis.  The most common are acute rhinitis, which is usually caused by a viral illness, allergic or seasonal rhinitis, and nonallergic or year-round rhinitis.  Nasal allergies occur certain times of the year.  Allergic rhinitis is caused when allergens in the air trigger the release of histamine in the body.  Histamine causes itching, swelling, and fluid to build up in the fragile linings of the nasal passages, sinuses and eyelids.  An itchy nose and clear discharge are common.  I recommend the following over the counter treatments: You should take a daily dose of antihistamine and Xyzal 5 mg take 1 tablet daily  I also would recommend a nasal spray: (I have sent this as an script so it is cheaper or free) Flonase 2 sprays into each nostril once daily  You may also benefit from eye drops such as: Systane 1-2 driops each eye twice daily as needed  HOME CARE:   You can use an over-the-counter saline nasal spray as needed  Avoid areas where there is heavy dust, mites, or molds  Stay indoors on windy days during the pollen season  Keep windows closed in home, at least in bedroom; use air conditioner.  Use high-efficiency house air  filter  Keep windows closed in car, turn AC on re-circulate  Avoid playing out with dog during pollen season  GET HELP RIGHT AWAY IF:   If your symptoms do not improve within 10 days  You become short of breath  You develop yellow or green discharge from your nose for over 3 days  You have coughing fits  MAKE SURE YOU:   Understand these instructions  Will watch your condition  Will get help right away if you are not doing well or get worse  Thank you for choosing an e-visit. Your e-visit answers were reviewed by a board certified advanced clinical practitioner to complete your personal care plan. Depending upon the condition, your plan could have included both over the counter or prescription medications. Please review your pharmacy choice. Be sure that the pharmacy you have chosen is open so that you can pick up your prescription now.  If there is a problem you may message your provider in [place] to have the prescription routed to another pharmacy. Your safety is important to [place]. If you have drug allergies check your prescription carefully.  For the next 24 hours, you can use MyChart to ask questions about today's visit, request a non-urgent call back, or ask for a work or school excuse from your e-visit provider. You will get an email in the next two days asking about your experience. I hope that your e-visit has been valuable and will speed your recovery.

## 2019-05-15 IMAGING — CR DG C-ARM 1-60 MIN
1 series · 2 of 2 positions shown · non-contrast
Comparison: none

REASON FOR EXAM: Post-op
COMMENTS:

PROCEDURE:     DXR - DXR C-ARM WITH SPOT IMAGES  - January 27, 2008  [DATE]
RESULT:     Contrast is visualized in the hepatic ducts and common duct. No
retained stone is seen.  Contrast is noted to flow into the duodenum without
evidence of obstruction.

[imported/digitized images · 2 of 2 slices shown]
[im 1/2]
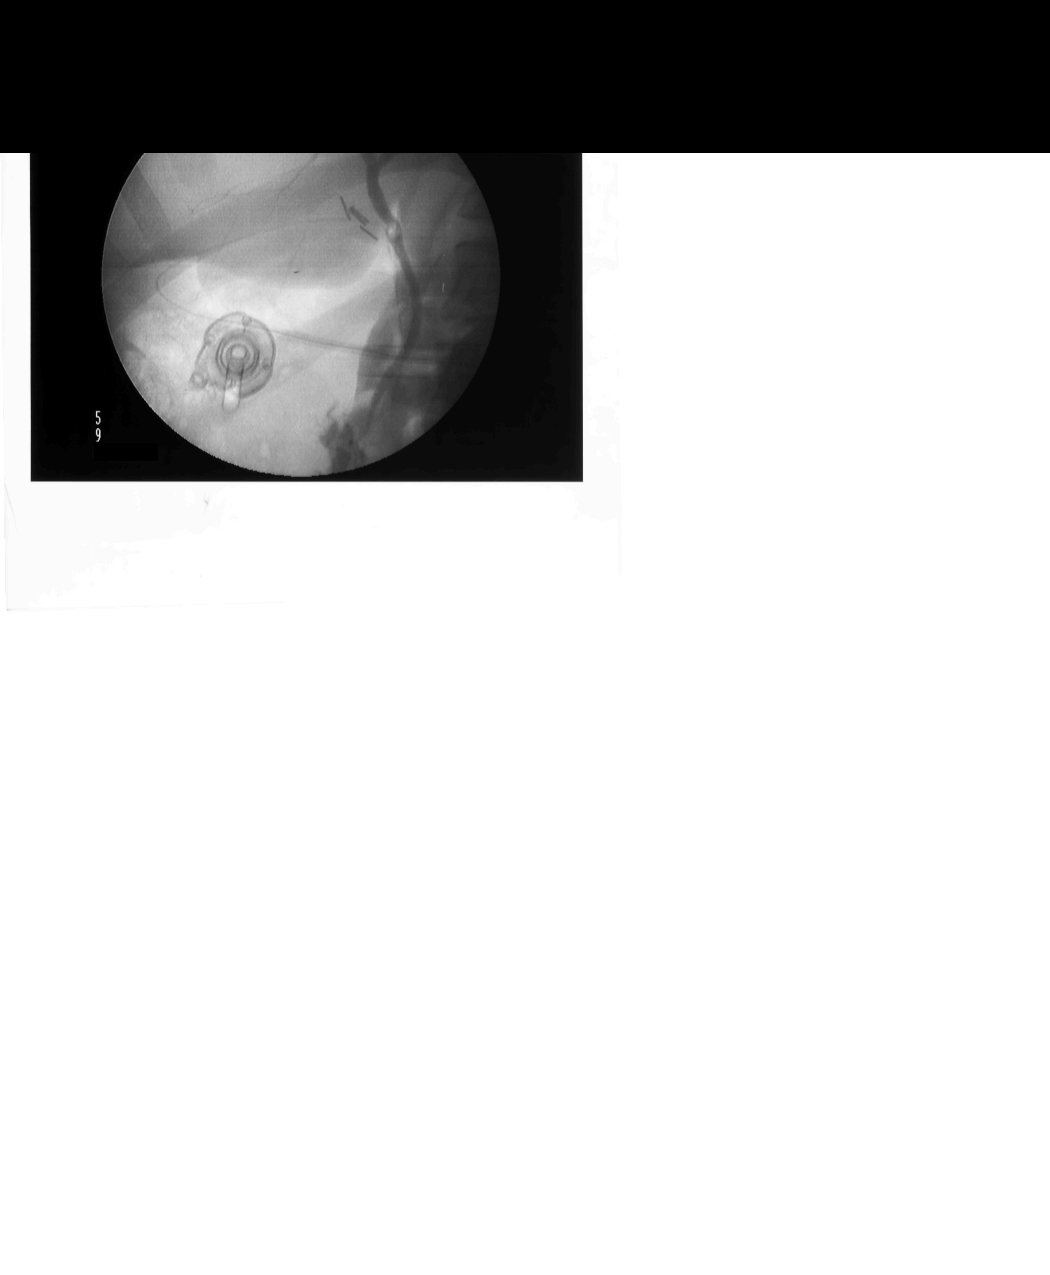
[im 2/2]
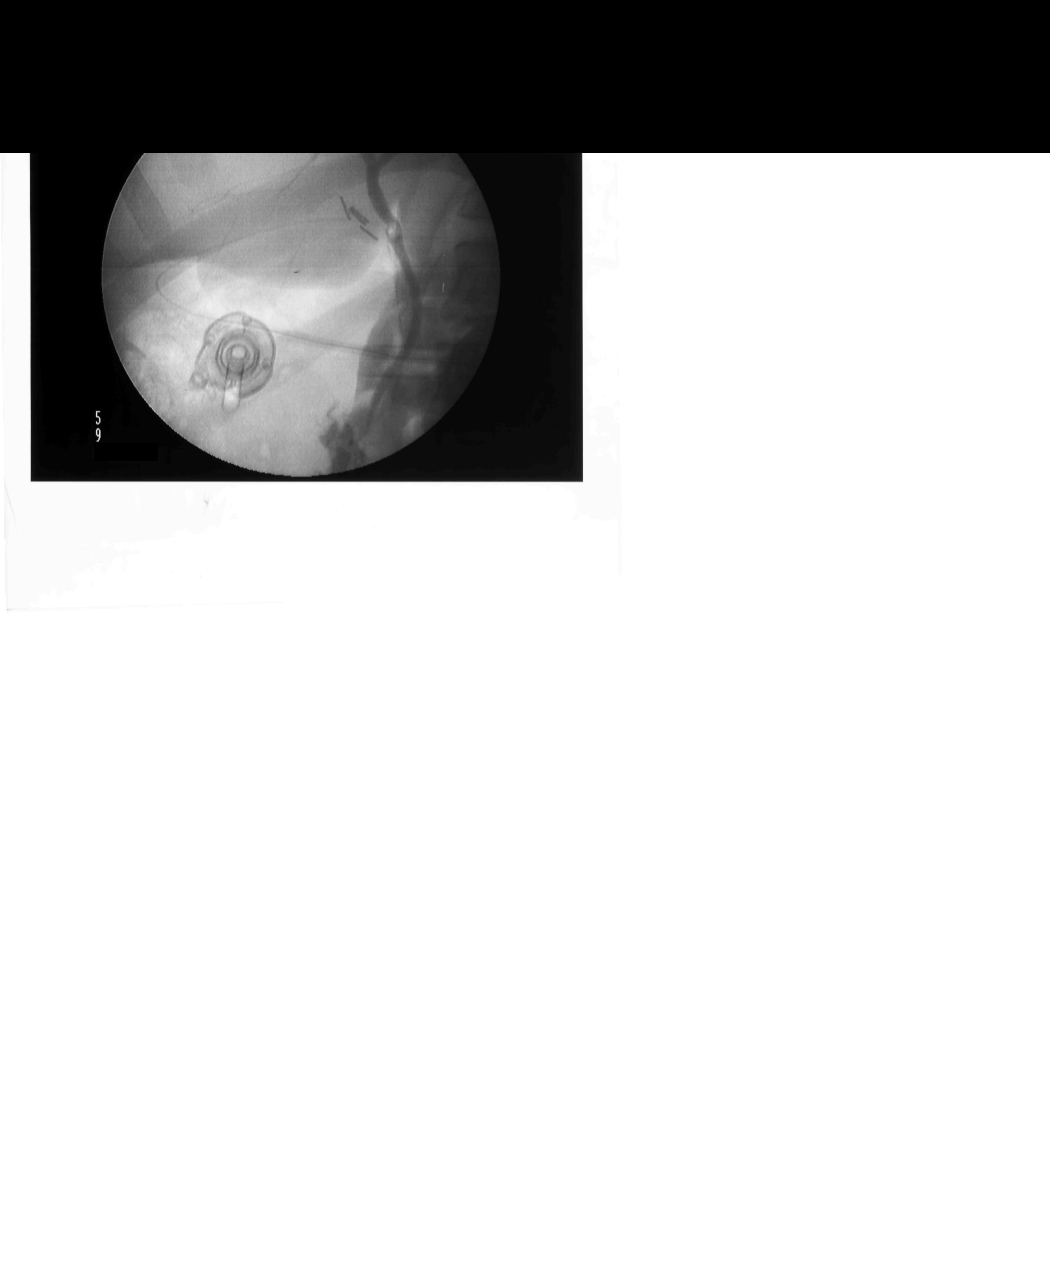

[2 of 2 positions shown; findings below may reference images not displayed]

IMPRESSION: Normal operative cholangiogram.

## 2019-07-06 ENCOUNTER — Other Ambulatory Visit: Payer: Self-pay

## 2019-07-06 DIAGNOSIS — Z20822 Contact with and (suspected) exposure to covid-19: Secondary | ICD-10-CM

## 2019-07-08 LAB — NOVEL CORONAVIRUS, NAA: SARS-CoV-2, NAA: NOT DETECTED

## 2019-08-24 ENCOUNTER — Ambulatory Visit (INDEPENDENT_AMBULATORY_CARE_PROVIDER_SITE_OTHER): Payer: Managed Care, Other (non HMO) | Admitting: Family Medicine

## 2019-08-24 ENCOUNTER — Encounter: Payer: Self-pay | Admitting: Family Medicine

## 2019-08-24 ENCOUNTER — Other Ambulatory Visit: Payer: Self-pay

## 2019-08-24 ENCOUNTER — Ambulatory Visit: Payer: BLUE CROSS/BLUE SHIELD | Admitting: Family Medicine

## 2019-08-24 VITALS — BP 120/73 | HR 76 | Temp 98.3°F | Wt 198.0 lb

## 2019-08-24 DIAGNOSIS — R232 Flushing: Secondary | ICD-10-CM | POA: Diagnosis not present

## 2019-08-24 DIAGNOSIS — R634 Abnormal weight loss: Secondary | ICD-10-CM | POA: Diagnosis not present

## 2019-08-24 DIAGNOSIS — Z23 Encounter for immunization: Secondary | ICD-10-CM | POA: Diagnosis not present

## 2019-08-24 MED ORDER — BUSPIRONE HCL 5 MG PO TABS: 5.0000 mg | ORAL_TABLET | Freq: Three times a day (TID) | ORAL | 1 refills | Status: DC

## 2019-08-24 MED ORDER — GABAPENTIN 100 MG PO CAPS: 100.0000 mg | ORAL_CAPSULE | Freq: Every evening | ORAL | 0 refills | Status: DC | PRN

## 2019-08-24 NOTE — Progress Notes (Signed)
BP 120/73   Pulse 76   Temp 98.3 F (36.8 C) (Oral)   Wt 198 lb (89.8 kg)   SpO2 97%   BMI 31.96 kg/m    Subjective:    Patient ID: Joan Heath, female    DOB: 1986-05-21, 34 y.o.   MRN: [id number]  HPI: Joan Heath is a 33 y.o. female  Chief Complaint  Patient presents with  . Hot Flashes    x a few years ago. got worse about 6 months ago. during the night   Patient presents today for hot flashes that have bothered her for several years (since her uterine ablation in 2016 per patient) at bedtime, now all day long intermittently and all night every night the past 6 months or so. This has become very bothersome for her and is affecting her work life as well as her rest. Has never tried anything for sxs in the past. Assumed to be hormonal in nature, no other causes have been identified. No known hx of early menopause in her family and no known personal hx of hormonal imbalance or thyroid disease. Does not take any sort of contraception due to fhx of GYN cancers (sister with cervical cancer and hx of breast cancer). No fevers, SOB, rashes, headaches, body aches. In the past year, went from 217lb - 180s without much effort but notes she was under tons of stress over this period of time and not eating as much as she's used to eating.   Relevant past medical, surgical, family and social history reviewed and updated as indicated. Interim medical history since our last visit reviewed. Allergies and medications reviewed and updated.  Review of Systems  Per HPI unless specifically indicated above     Objective:    BP 120/73   Pulse 76   Temp 98.3 F (36.8 C) (Oral)   Wt 198 lb (89.8 kg)   SpO2 97%   BMI 31.96 kg/m   Wt Readings from Last 3 Encounters:  08/24/19 198 lb (89.8 kg)  03/06/18 216 lb (98 kg)  12/31/17 217 lb (98.4 kg)    Physical Exam Vitals signs and nursing note reviewed.  Constitutional:      Appearance: Normal appearance. She is not ill-appearing.  HENT:    Head: Atraumatic.  Eyes:     Extraocular Movements: Extraocular movements intact.     Conjunctiva/sclera: Conjunctivae normal.  Neck:     Musculoskeletal: Normal range of motion and neck supple.  Cardiovascular:     Rate and Rhythm: Normal rate and regular rhythm.     Heart sounds: Normal heart sounds.  Pulmonary:     Effort: Pulmonary effort is normal.     Breath sounds: Normal breath sounds.  Abdominal:     General: Bowel sounds are normal.     Palpations: Abdomen is soft.     Tenderness: There is no abdominal tenderness. There is no guarding.  Musculoskeletal: Normal range of motion.  Skin:    General: Skin is warm and dry.  Neurological:     Mental Status: She is alert and oriented to person, place, and time.  Psychiatric:        Mood and Affect: Mood normal.        Thought Content: Thought content normal.        Judgment: Judgment normal.     Results for orders placed or performed in visit on 08/24/19  CBC with Differential/Platelet out  Result Value Ref Range   WBC 12.0 (H) 3.4 -  10.8 x10E3/uL   RBC 4.87 3.77 - 5.28 x10E6/uL   Hemoglobin 14.5 11.1 - 15.9 g/dL   Hematocrit 42.8 34.0 - 46.6 %   MCV 88 79 - 97 fL   MCH 29.8 26.6 - 33.0 pg   MCHC 33.9 31.5 - 35.7 g/dL   RDW 12.3 11.7 - 15.4 %   Platelets 284 150 - 450 x10E3/uL   Neutrophils 67 Not Estab. %   Lymphs 24 Not Estab. %   Monocytes 5 Not Estab. %   Eos 2 Not Estab. %   Basos 1 Not Estab. %   Neutrophils Absolute 8.1 (H) 1.4 - 7.0 x10E3/uL   Lymphocytes Absolute 2.8 0.7 - 3.1 x10E3/uL   Monocytes Absolute 0.6 0.1 - 0.9 x10E3/uL   EOS (ABSOLUTE) 0.3 0.0 - 0.4 x10E3/uL   Basophils Absolute 0.1 0.0 - 0.2 x10E3/uL   Immature Granulocytes 1 Not Estab. %   Immature Grans (Abs) 0.1 0.0 - 0.1 x10E3/uL  Comprehensive metabolic panel  Result Value Ref Range   Glucose 84 65 - 99 mg/dL   BUN 7 6 - 20 mg/dL   Creatinine, Ser 0.91 0.57 - 1.00 mg/dL   GFR calc non Af Amer 83 >59 mL/min/1.73   GFR calc Af Amer  95 >59 mL/min/1.73   BUN/Creatinine Ratio 8 (L) 9 - 23   Sodium 142 134 - 144 mmol/L   Potassium 4.1 3.5 - 5.2 mmol/L   Chloride 105 96 - 106 mmol/L   CO2 23 20 - 29 mmol/L   Calcium 9.2 8.7 - 10.2 mg/dL   Total Protein 7.0 6.0 - 8.5 g/dL   Albumin 4.7 3.8 - 4.8 g/dL   Globulin, Total 2.3 1.5 - 4.5 g/dL   Albumin/Globulin Ratio 2.0 1.2 - 2.2   Bilirubin Total 0.4 0.0 - 1.2 mg/dL   Alkaline Phosphatase 46 39 - 117 IU/L   AST 12 0 - 40 IU/L   ALT 17 0 - 32 IU/L  TSH  Result Value Ref Range   TSH 3.830 0.450 - 4.500 uIU/mL  Vitamin D (25 hydroxy)  Result Value Ref Range   Vit D, 25-Hydroxy 18.0 (L) 30.0 - 100.0 ng/mL  Vitamin B12  Result Value Ref Range   Vitamin B-12 497 232 - 1,245 pg/mL  Estrogens, Total  Result Value Ref Range   Estrogen 358 pg/mL  Progesterone  Result Value Ref Range   Progesterone 1.0 ng/mL  FSH  Result Value Ref Range   FSH 15.7 mIU/mL      Assessment & Plan:   Problem List Items Addressed This Visit    None    Visit Diagnoses    Hot flashes    -  Primary   Unclear etiology at this time, will check labs and start gabapentin to see if this will help reduce sxs   Relevant Orders   CBC with Differential/Platelet out (Completed)   Comprehensive metabolic panel (Completed)   TSH (Completed)   Vitamin D (25 hydroxy) (Completed)   Vitamin B12 (Completed)   Estrogens, Total (Completed)   Progesterone (Completed)   FSH (Completed)   Weight loss       Suspect related to stressors, but will continue to monitor closely and perform recommended cancer screenings at upcoming CPE. Await lab results   Flu vaccine need       Relevant Orders   Flu Vaccine QUAD 6+ mos PF IM (Fluarix Quad PF) (Completed)    25 minutes spent today in direct care and counseling with patient  Follow up plan: Return in about 4 weeks (around 09/21/2019) for CPE and hot flash f/u with Dr. Wynetta Emery.

## 2019-08-27 LAB — CBC WITH DIFFERENTIAL/PLATELET
Basophils Absolute: 0.1 10*3/uL (ref 0.0–0.2)
Basos: 1 %
EOS (ABSOLUTE): 0.3 10*3/uL (ref 0.0–0.4)
Eos: 2 %
Hematocrit: 42.8 % (ref 34.0–46.6)
Hemoglobin: 14.5 g/dL (ref 11.1–15.9)
Immature Grans (Abs): 0.1 10*3/uL (ref 0.0–0.1)
Immature Granulocytes: 1 %
Lymphocytes Absolute: 2.8 10*3/uL (ref 0.7–3.1)
Lymphs: 24 %
MCH: 29.8 pg (ref 26.6–33.0)
MCHC: 33.9 g/dL (ref 31.5–35.7)
MCV: 88 fL (ref 79–97)
Monocytes Absolute: 0.6 10*3/uL (ref 0.1–0.9)
Monocytes: 5 %
Neutrophils Absolute: 8.1 10*3/uL — ABNORMAL HIGH (ref 1.4–7.0)
Neutrophils: 67 %
Platelets: 284 10*3/uL (ref 150–450)
RBC: 4.87 x10E6/uL (ref 3.77–5.28)
RDW: 12.3 % (ref 11.7–15.4)
WBC: 12 10*3/uL — ABNORMAL HIGH (ref 3.4–10.8)

## 2019-08-27 LAB — COMPREHENSIVE METABOLIC PANEL
ALT: 17 IU/L (ref 0–32)
AST: 12 IU/L (ref 0–40)
Albumin/Globulin Ratio: 2 (ref 1.2–2.2)
Albumin: 4.7 g/dL (ref 3.8–4.8)
Alkaline Phosphatase: 46 IU/L (ref 39–117)
BUN/Creatinine Ratio: 8 — ABNORMAL LOW (ref 9–23)
BUN: 7 mg/dL (ref 6–20)
Bilirubin Total: 0.4 mg/dL (ref 0.0–1.2)
CO2: 23 mmol/L (ref 20–29)
Calcium: 9.2 mg/dL (ref 8.7–10.2)
Chloride: 105 mmol/L (ref 96–106)
Creatinine, Ser: 0.91 mg/dL (ref 0.57–1.00)
GFR calc Af Amer: 95 mL/min/{1.73_m2} (ref >59)
GFR calc non Af Amer: 83 mL/min/{1.73_m2} (ref >59)
Globulin, Total: 2.3 g/dL (ref 1.5–4.5)
Glucose: 84 mg/dL (ref 65–99)
Potassium: 4.1 mmol/L (ref 3.5–5.2)
Sodium: 142 mmol/L (ref 134–144)
Total Protein: 7 g/dL (ref 6.0–8.5)

## 2019-08-27 LAB — ESTROGENS, TOTAL: Estrogen: 358 pg/mL

## 2019-08-27 LAB — FOLLICLE STIMULATING HORMONE: FSH: 15.7 m[IU]/mL

## 2019-08-27 LAB — VITAMIN D 25 HYDROXY (VIT D DEFICIENCY, FRACTURES): Vit D, 25-Hydroxy: 18 ng/mL — ABNORMAL LOW (ref 30.0–100.0)

## 2019-08-27 LAB — TSH: TSH: 3.83 u[IU]/mL (ref 0.450–4.500)

## 2019-08-27 LAB — PROGESTERONE: Progesterone: 1 ng/mL

## 2019-08-27 LAB — VITAMIN B12: Vitamin B-12: 497 pg/mL (ref 232–1245)

## 2019-09-21 ENCOUNTER — Other Ambulatory Visit: Payer: Self-pay

## 2019-09-21 ENCOUNTER — Ambulatory Visit (INDEPENDENT_AMBULATORY_CARE_PROVIDER_SITE_OTHER): Payer: Managed Care, Other (non HMO) | Admitting: Family Medicine

## 2019-09-21 ENCOUNTER — Encounter: Payer: Self-pay | Admitting: Family Medicine

## 2019-09-21 DIAGNOSIS — R454 Irritability and anger: Secondary | ICD-10-CM

## 2019-09-21 DIAGNOSIS — R634 Abnormal weight loss: Secondary | ICD-10-CM

## 2019-09-21 DIAGNOSIS — R232 Flushing: Secondary | ICD-10-CM

## 2019-09-21 NOTE — Progress Notes (Signed)
Pt already scheduled for January .

## 2019-09-21 NOTE — Progress Notes (Signed)
There were no vitals taken for this visit.   Subjective:    Patient ID: Joan Heath, female    DOB: [birth year], 33 y.o.   MRN: [id number]  HPI: Joan Heath is a 33 y.o. female  Chief Complaint  Patient presents with  . Hot Flashes   Feeling a lot better on the gabapentin. Has been noticing when she is missing a dose, but in general she is feeling much better. Feels like the gabapentin gives her some extra energy rather than making her sleepy.   BIPOLAR- Has been having some trouble with impulse control. Ripped a TV and play station out of the wall. Has been more irritable.  Mood status: exacerbated Satisfied with current treatment?: no Symptom severity: moderate  Duration of current treatment : not currently on anything.  Side effects: no Medication compliance: good compliance Depressed mood: no Anxious mood: yes Anhedonia: no Significant weight loss or gain: yes Insomnia: no  Fatigue: yes Feelings of worthlessness or guilt: no Impaired concentration/indecisiveness: no Suicidal ideations: no Hopelessness: no Crying spells: no Depression screen [facility] 2/9 12/31/2017 10/01/2017  Decreased Interest 1 1  Down, Depressed, Hopeless 1 1  PHQ - 2 Score 2 2  Altered sleeping 3 -  Tired, decreased energy 2 -  Change in appetite 1 -  Feeling bad or failure about yourself  1 -  Trouble concentrating 0 -  Moving slowly or fidgety/restless 0 -  Suicidal thoughts 1 -  PHQ-9 Score 10 -  Difficult doing work/chores Somewhat difficult -    Relevant past medical, surgical, family and social history reviewed and updated as indicated. Interim medical history since our last visit reviewed. Allergies and medications reviewed and updated.  Review of Systems  Constitutional: Negative.   Respiratory: Negative.   Cardiovascular: Negative.   Skin: Negative.   Psychiatric/Behavioral: Positive for agitation. Negative for behavioral problems, confusion, decreased concentration, dysphoric  mood, hallucinations, self-injury, sleep disturbance and suicidal ideas. The patient is nervous/anxious. The patient is not hyperactive.     Per HPI unless specifically indicated above     Objective:    There were no vitals taken for this visit.  Wt Readings from Last 3 Encounters:  08/24/19 198 lb (89.8 kg)  03/06/18 216 lb (98 kg)  12/31/17 217 lb (98.4 kg)    Physical Exam Vitals signs and nursing note reviewed.  Constitutional:      General: She is not in acute distress.    Appearance: Normal appearance. She is not ill-appearing, toxic-appearing or diaphoretic.  HENT:     Head: Normocephalic and atraumatic.     Right Ear: External ear normal.     Left Ear: External ear normal.     Nose: Nose normal.     Mouth/Throat:     Mouth: Mucous membranes are moist.     Pharynx: Oropharynx is clear.  Eyes:     General: No scleral icterus.       Right eye: No discharge.        Left eye: No discharge.     Conjunctiva/sclera: Conjunctivae normal.     Pupils: Pupils are equal, round, and reactive to light.  Neck:     Musculoskeletal: Normal range of motion.  Pulmonary:     Effort: Pulmonary effort is normal. No respiratory distress.     Comments: Speaking in full sentences Musculoskeletal: Normal range of motion.  Skin:    Coloration: Skin is not jaundiced or pale.     Findings: No bruising, erythema, lesion  or rash.  Neurological:     Mental Status: She is alert and oriented to person, place, and time. Mental status is at baseline.  Psychiatric:        Mood and Affect: Mood normal.        Behavior: Behavior normal.        Thought Content: Thought content normal.        Judgment: Judgment normal.     Results for orders placed or performed in visit on 08/24/19  CBC with Differential/Platelet out  Result Value Ref Range   WBC 12.0 (H) 3.4 - 10.8 x10E3/uL   RBC 4.87 3.77 - 5.28 x10E6/uL   Hemoglobin 14.5 11.1 - 15.9 g/dL   Hematocrit 42.8 34.0 - 46.6 %   MCV 88 79 - 97 fL    MCH 29.8 26.6 - 33.0 pg   MCHC 33.9 31.5 - 35.7 g/dL   RDW 12.3 11.7 - 15.4 %   Platelets 284 150 - 450 x10E3/uL   Neutrophils 67 Not Estab. %   Lymphs 24 Not Estab. %   Monocytes 5 Not Estab. %   Eos 2 Not Estab. %   Basos 1 Not Estab. %   Neutrophils Absolute 8.1 (H) 1.4 - 7.0 x10E3/uL   Lymphocytes Absolute 2.8 0.7 - 3.1 x10E3/uL   Monocytes Absolute 0.6 0.1 - 0.9 x10E3/uL   EOS (ABSOLUTE) 0.3 0.0 - 0.4 x10E3/uL   Basophils Absolute 0.1 0.0 - 0.2 x10E3/uL   Immature Granulocytes 1 Not Estab. %   Immature Grans (Abs) 0.1 0.0 - 0.1 x10E3/uL  Comprehensive metabolic panel  Result Value Ref Range   Glucose 84 65 - 99 mg/dL   BUN 7 6 - 20 mg/dL   Creatinine, Ser 0.91 0.57 - 1.00 mg/dL   GFR calc non Af Amer 83 >59 mL/min/1.73   GFR calc Af Amer 95 >59 mL/min/1.73   BUN/Creatinine Ratio 8 (L) 9 - 23   Sodium 142 134 - 144 mmol/L   Potassium 4.1 3.5 - 5.2 mmol/L   Chloride 105 96 - 106 mmol/L   CO2 23 20 - 29 mmol/L   Calcium 9.2 8.7 - 10.2 mg/dL   Total Protein 7.0 6.0 - 8.5 g/dL   Albumin 4.7 3.8 - 4.8 g/dL   Globulin, Total 2.3 1.5 - 4.5 g/dL   Albumin/Globulin Ratio 2.0 1.2 - 2.2   Bilirubin Total 0.4 0.0 - 1.2 mg/dL   Alkaline Phosphatase 46 39 - 117 IU/L   AST 12 0 - 40 IU/L   ALT 17 0 - 32 IU/L  TSH  Result Value Ref Range   TSH 3.830 0.450 - 4.500 uIU/mL  Vitamin D (25 hydroxy)  Result Value Ref Range   Vit D, 25-Hydroxy 18.0 (L) 30.0 - 100.0 ng/mL  Vitamin B12  Result Value Ref Range   Vitamin B-12 497 232 - 1,245 pg/mL  Estrogens, Total  Result Value Ref Range   Estrogen 358 pg/mL  Progesterone  Result Value Ref Range   Progesterone 1.0 ng/mL  FSH  Result Value Ref Range   FSH 15.7 mIU/mL      Assessment & Plan:   Problem List Items Addressed This Visit    None    Visit Diagnoses    Hot flashes    -  Primary   Resolved with gabapentin. Continue to monitor. Refills given. Call with any concerns.    Weight loss       Will get her in for check  on our scale. Needs  Pap. Call with any concerns.    Irritability       Acting up. Will take her buspar more often and see how that helps. If not improving consider PRN abilify. Call with any concerns.        Follow up plan: Return ASAP Physical.    . This visit was completed via Doximity due to the restrictions of the COVID-19 pandemic. All issues as above were discussed and addressed. Physical exam was done as above through visual confirmation on Doximity. If it was felt that the patient should be evaluated in the office, they were directed there. The patient verbally consented to this visit. . Location of the patient: home . Location of the provider: work . Those involved with this call:  . Provider: Park Liter, DO . CMA: Tiffany Reel, CMA . Front Desk/Registration: Don Perking  . Time spent on call: 25 minutes with patient face to face via video conference. More than 50% of this time was spent in counseling and coordination of care. 40 minutes total spent in review of patient's record and preparation of their chart.

## 2019-10-06 ENCOUNTER — Other Ambulatory Visit: Payer: Self-pay | Admitting: Family Medicine

## 2019-10-26 ENCOUNTER — Other Ambulatory Visit: Payer: Self-pay

## 2019-10-27 ENCOUNTER — Encounter: Payer: Self-pay | Admitting: Family Medicine

## 2019-10-27 ENCOUNTER — Other Ambulatory Visit (HOSPITAL_COMMUNITY)
Admission: RE | Admit: 2019-10-27 | Discharge: 2019-10-27 | Disposition: A | Discharge status: Home / Self Care | Payer: Managed Care, Other (non HMO) | Source: Ambulatory Visit | Attending: Family Medicine | Admitting: Family Medicine

## 2019-10-27 ENCOUNTER — Ambulatory Visit (INDEPENDENT_AMBULATORY_CARE_PROVIDER_SITE_OTHER): Payer: Managed Care, Other (non HMO) | Admitting: Family Medicine

## 2019-10-27 ENCOUNTER — Other Ambulatory Visit: Payer: Self-pay

## 2019-10-27 VITALS — BP 120/79 | HR 92 | Temp 99.0°F | Ht 66.46 in | Wt 197.2 lb

## 2019-10-27 DIAGNOSIS — Z0000 Encounter for general adult medical examination without abnormal findings: Secondary | ICD-10-CM

## 2019-10-27 DIAGNOSIS — Z124 Encounter for screening for malignant neoplasm of cervix: Secondary | ICD-10-CM

## 2019-10-27 DIAGNOSIS — Z1231 Encounter for screening mammogram for malignant neoplasm of breast: Secondary | ICD-10-CM | POA: Diagnosis not present

## 2019-10-27 MED ORDER — GABAPENTIN 100 MG PO CAPS: 100.0000 mg | ORAL_CAPSULE | Freq: Every day | ORAL | 1 refills | Status: DC

## 2019-10-27 MED ORDER — BUSPIRONE HCL 5 MG PO TABS: 5.0000 mg | ORAL_TABLET | Freq: Three times a day (TID) | ORAL | 1 refills | Status: DC

## 2019-10-27 NOTE — Progress Notes (Signed)
BP 120/79   Pulse 92   Temp 99 F (37.2 C)   Ht 5' 6.46" (1.688 m)   Wt 197 lb 4 oz (89.5 kg)   SpO2 97%   BMI 31.40 kg/m    Subjective:    Patient ID: Joan Heath, female    DOB: 04/21/1986, 34 y.o.   MRN: [id number]  HPI: Joan Heath is a 34 y.o. female presenting on 10/27/2019 for comprehensive medical examination. Current medical complaints include: issues with her finger  Menopausal Symptoms: yes  Depression Screen done today and results listed below:  Depression screen [facility] 2/9 10/27/2019 12/31/2017 10/01/2017  Decreased Interest 0 1 1  Down, Depressed, Hopeless 0 1 1  PHQ - 2 Score 0 2 2  Altered sleeping 1 3 -  Tired, decreased energy 1 2 -  Change in appetite 0 1 -  Feeling bad or failure about yourself  0 1 -  Trouble concentrating 0 0 -  Moving slowly or fidgety/restless 0 0 -  Suicidal thoughts 0 1 -  PHQ-9 Score 2 10 -  Difficult doing work/chores Not difficult at all Somewhat difficult -    Past Medical History:  Past Medical History:  Diagnosis Date  . Anxiety    no meds  . Bipolar 1 disorder (HCC)    no meds currently  . Chlamydia infection 01/12/2015  . GERD (gastroesophageal reflux disease)    occasional - diet controlled  . Headache    migraines - otc meds prn    Surgical History:  Past Surgical History:  Procedure Laterality Date  . APPENDECTOMY    . 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 CESAREAN SECTION  2007  . CESAREAN SECTION WITH BILATERAL TUBAL LIGATION N/A 01/22/2014   Procedure: CESAREAN SECTION WITH BILATERAL TUBAL LIGATION;  Surgeon: Truett Mainland, DO;  Location: [facility];  Service: Gynecology;  Laterality: N/A;  . CHOLECYSTECTOMY    . DILITATION & CURRETTAGE/HYSTROSCOPY WITH HYDROTHERMAL ABLATION N/A 03/09/2015   Procedure: HYSTEROSCOPY WITH HYDROTHERMAL ABLATION;  Surgeon: Osborne Oman, MD;  Location: [facility];  Service: Gynecology;  Laterality: N/A;  . TUBAL LIGATION    . WISDOM TOOTH EXTRACTION       Medications:  Current Outpatient Medications on File Prior to Visit  Medication Sig  . fluticasone (FLONASE) 50 MCG/ACT nasal spray Place 1 spray into both nostrils 2 (two) times daily.  Marland Kitchen omeprazole (PRILOSEC) 20 MG capsule Take 1 capsule (20 mg total) by mouth daily.   No current facility-administered medications on file prior to visit.    Allergies:  Allergies  Allergen Reactions  . Peanuts [Peanut Oil] Anaphylaxis  . Cinnamon Swelling    Social History:  Social History   Socioeconomic History  . Marital status: Single    Spouse name: Not on file  . Number of children: Not on file  . Years of education: Not on file  . Highest education level: Not on file  Occupational History  . Not on file  Tobacco Use  . Smoking status: Former Smoker    Packs/day: 0.25    Years: 6.00    Pack years: 1.50    Types: Cigarettes    Quit date: 08/10/2013    Years since quitting: 6.2  . Smokeless tobacco: Never Used  Substance and Sexual Activity  . Alcohol use: Yes    Comment: socially  . Drug use: No    Types: Marijuana    Comment: last use in 10/2013  .  Sexual activity: Yes    Birth control/protection: Surgical  Other Topics Concern  . Not on file  Social History Narrative  . Not on file   Social Determinants of Health   Financial Resource Strain:   . Difficulty of Paying Living Expenses: Not on file  Food Insecurity:   . Worried About Charity fundraiser in the Last Year: Not on file  . Ran Out of Food in the Last Year: Not on file  Transportation Needs:   . Lack of Transportation (Medical): Not on file  . Lack of Transportation (Non-Medical): Not on file  Physical Activity:   . Days of Exercise per Week: Not on file  . Minutes of Exercise per Session: Not on file  Stress:   . Feeling of Stress : Not on file  Social Connections:   . Frequency of Communication with Friends and Family: Not on file  . Frequency of Social Gatherings with Friends and Family: Not on  file  . Attends Religious Services: Not on file  . Active Member of Clubs or Organizations: Not on file  . Attends Archivist Meetings: Not on file  . Marital Status: Not on file  Intimate Partner Violence:   . Fear of Current or Ex-Partner: Not on file  . Emotionally Abused: Not on file  . Physically Abused: Not on file  . Sexually Abused: Not on file   Social History   Tobacco Use  Smoking Status Former Smoker  . Packs/day: 0.25  . Years: 6.00  . Pack years: 1.50  . Types: Cigarettes  . Quit date: 08/10/2013  . Years since quitting: 6.2  Smokeless Tobacco Never Used   Social History   Substance and Sexual Activity  Alcohol Use Yes   Comment: socially    Family History:  Family History  Problem Relation Age of Onset  . COPD Maternal Grandmother   . Cancer Sister 25       lumpectomy for breast cancer  . Breast cancer Sister 43  . Cervical cancer Sister   . Cancer Sister   . Birth defects Neg Hx   . Asthma Neg Hx   . Arthritis Neg Hx   . Alcohol abuse Neg Hx   . Drug abuse Neg Hx   . Diabetes Neg Hx   . Depression Neg Hx   . Early death Neg Hx   . Hearing loss Neg Hx   . Heart disease Neg Hx   . Hyperlipidemia Neg Hx   . Hypertension Neg Hx   . Learning disabilities Neg Hx   . Kidney disease Neg Hx   . Mental illness Neg Hx   . Mental retardation Neg Hx   . Miscarriages / Stillbirths Neg Hx   . Stroke Neg Hx   . Vision loss Neg Hx     Past medical history, surgical history, medications, allergies, family history and social history reviewed with patient today and changes made to appropriate areas of the chart.   Review of Systems  Constitutional: Positive for diaphoresis. Negative for chills, fever, malaise/fatigue and weight loss.  HENT: Positive for hearing loss. Negative for congestion, ear discharge, ear pain, nosebleeds, sinus pain, sore throat and tinnitus.   Eyes: Negative.   Respiratory: Negative.  Negative for stridor.    Cardiovascular: Negative.   Gastrointestinal: Positive for diarrhea and heartburn. Negative for abdominal pain, blood in stool, constipation, melena, nausea and vomiting.  Genitourinary: Negative.   Musculoskeletal: Negative.   Skin: Positive for rash (  spot on her side). Negative for itching.  Neurological: Negative.   Endo/Heme/Allergies: Negative for environmental allergies and polydipsia. Bruises/bleeds easily.  Psychiatric/Behavioral: Negative.     All other ROS negative except what is listed above and in the HPI.      Objective:    BP 120/79   Pulse 92   Temp 99 F (37.2 C)   Ht 5' 6.46" (1.688 m)   Wt 197 lb 4 oz (89.5 kg)   SpO2 97%   BMI 31.40 kg/m   Wt Readings from Last 3 Encounters:  10/27/19 197 lb 4 oz (89.5 kg)  08/24/19 198 lb (89.8 kg)  03/06/18 216 lb (98 kg)    Physical Exam Vitals and nursing note reviewed. Exam conducted with a chaperone present.  Constitutional:      General: She is not in acute distress.    Appearance: Normal appearance. She is not ill-appearing, toxic-appearing or diaphoretic.  HENT:     Head: Normocephalic and atraumatic.     Right Ear: Tympanic membrane, ear canal and external ear normal. There is no impacted cerumen.     Left Ear: Tympanic membrane, ear canal and external ear normal. There is no impacted cerumen.     Nose: Nose normal. No congestion or rhinorrhea.     Mouth/Throat:     Mouth: Mucous membranes are moist.     Pharynx: Oropharynx is clear. No oropharyngeal exudate or posterior oropharyngeal erythema.  Eyes:     General: No scleral icterus.       Right eye: No discharge.        Left eye: No discharge.     Extraocular Movements: Extraocular movements intact.     Conjunctiva/sclera: Conjunctivae normal.     Pupils: Pupils are equal, round, and reactive to light.  Neck:     Vascular: No carotid bruit.  Cardiovascular:     Rate and Rhythm: Normal rate and regular rhythm.     Pulses: Normal pulses.     Heart  sounds: No murmur. No friction rub. No gallop.   Pulmonary:     Effort: Pulmonary effort is normal. No respiratory distress.     Breath sounds: Normal breath sounds. No stridor. No wheezing, rhonchi or rales.  Chest:     Chest wall: No tenderness.     Breasts:        Right: Normal. No swelling, bleeding, inverted nipple, mass, nipple discharge, skin change or tenderness.        Left: Normal. No bleeding, inverted nipple, mass, nipple discharge, skin change or tenderness.  Abdominal:     General: Abdomen is flat. Bowel sounds are normal. There is no distension.     Palpations: Abdomen is soft. There is no mass.     Tenderness: There is no abdominal tenderness. There is no right CVA tenderness, left CVA tenderness, guarding or rebound.     Hernia: No hernia is present.  Genitourinary:    Labia:        Right: No rash, tenderness, lesion or injury.        Left: No rash, tenderness, lesion or injury.      Urethra: No prolapse, urethral pain, urethral swelling or urethral lesion.     Vagina: Normal.     Cervix: Friability and erythema present. No discharge, lesion, cervical bleeding or eversion.     Uterus: Normal.   Musculoskeletal:        General: No swelling, tenderness, deformity or signs of injury.  Cervical back: Normal range of motion and neck supple. No rigidity. No muscular tenderness.     Right lower leg: No edema.     Left lower leg: No edema.  Lymphadenopathy:     Cervical: No cervical adenopathy.     Upper Body:     Right upper body: No supraclavicular, axillary or pectoral adenopathy.     Left upper body: No supraclavicular, axillary or pectoral adenopathy.  Skin:    General: Skin is warm and dry.     Capillary Refill: Capillary refill takes less than 2 seconds.     Coloration: Skin is not jaundiced or pale.     Findings: No bruising, erythema, lesion or rash.  Neurological:     General: No focal deficit present.     Mental Status: She is alert and oriented to  person, place, and time. Mental status is at baseline.     Cranial Nerves: No cranial nerve deficit.     Sensory: No sensory deficit.     Motor: No weakness.     Coordination: Coordination normal.     Gait: Gait normal.     Deep Tendon Reflexes: Reflexes normal.  Psychiatric:        Mood and Affect: Mood normal.        Behavior: Behavior normal.        Thought Content: Thought content normal.        Judgment: Judgment normal.     Results for orders placed or performed in visit on 08/24/19  CBC with Differential/Platelet out  Result Value Ref Range   WBC 12.0 (H) 3.4 - 10.8 x10E3/uL   RBC 4.87 3.77 - 5.28 x10E6/uL   Hemoglobin 14.5 11.1 - 15.9 g/dL   Hematocrit 42.8 34.0 - 46.6 %   MCV 88 79 - 97 fL   MCH 29.8 26.6 - 33.0 pg   MCHC 33.9 31.5 - 35.7 g/dL   RDW 12.3 11.7 - 15.4 %   Platelets 284 150 - 450 x10E3/uL   Neutrophils 67 Not Estab. %   Lymphs 24 Not Estab. %   Monocytes 5 Not Estab. %   Eos 2 Not Estab. %   Basos 1 Not Estab. %   Neutrophils Absolute 8.1 (H) 1.4 - 7.0 x10E3/uL   Lymphocytes Absolute 2.8 0.7 - 3.1 x10E3/uL   Monocytes Absolute 0.6 0.1 - 0.9 x10E3/uL   EOS (ABSOLUTE) 0.3 0.0 - 0.4 x10E3/uL   Basophils Absolute 0.1 0.0 - 0.2 x10E3/uL   Immature Granulocytes 1 Not Estab. %   Immature Grans (Abs) 0.1 0.0 - 0.1 x10E3/uL  Comprehensive metabolic panel  Result Value Ref Range   Glucose 84 65 - 99 mg/dL   BUN 7 6 - 20 mg/dL   Creatinine, Ser 0.91 0.57 - 1.00 mg/dL   GFR calc non Af Amer 83 >59 mL/min/1.73   GFR calc Af Amer 95 >59 mL/min/1.73   BUN/Creatinine Ratio 8 (L) 9 - 23   Sodium 142 134 - 144 mmol/L   Potassium 4.1 3.5 - 5.2 mmol/L   Chloride 105 96 - 106 mmol/L   CO2 23 20 - 29 mmol/L   Calcium 9.2 8.7 - 10.2 mg/dL   Total Protein 7.0 6.0 - 8.5 g/dL   Albumin 4.7 3.8 - 4.8 g/dL   Globulin, Total 2.3 1.5 - 4.5 g/dL   Albumin/Globulin Ratio 2.0 1.2 - 2.2   Bilirubin Total 0.4 0.0 - 1.2 mg/dL   Alkaline Phosphatase 46 39 - 117 IU/L   AST  12 0 - 40 IU/L   ALT 17 0 - 32 IU/L  TSH  Result Value Ref Range   TSH 3.830 0.450 - 4.500 uIU/mL  Vitamin D (25 hydroxy)  Result Value Ref Range   Vit D, 25-Hydroxy 18.0 (L) 30.0 - 100.0 ng/mL  Vitamin B12  Result Value Ref Range   Vitamin B-12 497 232 - 1,245 pg/mL  Estrogens, Total  Result Value Ref Range   Estrogen 358 pg/mL  Progesterone  Result Value Ref Range   Progesterone 1.0 ng/mL  FSH  Result Value Ref Range   FSH 15.7 mIU/mL      Assessment & Plan:   Problem List Items Addressed This Visit    None    Visit Diagnoses    Routine general medical examination at a health care facility    -  Primary   Vaccines up to date. Screening labs checked today. Mammogram ordered due to family history. Pap done. Continue diet and exercise. Call with any concerns.    Relevant Orders   CBC with Differential OUT   Comp Met (CMET)   Lipid Panel w/o Chol/HDL Ratio OUT   TSH   UA/M w/rflx Culture, Routine   GC/Chlamydia Probe Amp([facility])   HIV antibody (with reflex)   HSV(herpes simplex vrs) 1+2 ab-IgG   Hepatitis, Acute   RPR   Vit D  25 hydroxy (rtn osteoporosis monitoring)   Encounter for screening mammogram for malignant neoplasm of breast       Mammogram ordered today.   Relevant Orders   MM 3D SCREEN BREAST BILATERAL   Screening for cervical cancer       Pap done today.   Relevant Orders   Pap over 30       Follow up plan: Return in about 6 months (around 04/25/2020).   LABORATORY TESTING:  - Pap smear: pap done  IMMUNIZATIONS:   - Tdap: Tetanus vaccination status reviewed: last tetanus booster within 10 years. - Influenza: Up to date - Pneumovax: Refused  SCREENING: -Mammogram: Ordered today   PATIENT COUNSELING:   Advised to take 1 mg of folate supplement per day if capable of pregnancy.   Sexuality: Discussed sexually transmitted diseases, partner selection, use of condoms, avoidance of unintended pregnancy  and contraceptive alternatives.    Advised to avoid cigarette smoking.  I discussed with the patient that most people either abstain from alcohol or drink within safe limits (<=14/week and <=4 drinks/occasion for males, <=7/weeks and <= 3 drinks/occasion for females) and that the risk for alcohol disorders and other health effects rises proportionally with the number of drinks per week and how often a drinker exceeds daily limits.  Discussed cessation/primary prevention of drug use and availability of treatment for abuse.   Diet: Encouraged to adjust caloric intake to maintain  or achieve ideal body weight, to reduce intake of dietary saturated fat and total fat, to limit sodium intake by avoiding high sodium foods and not adding table salt, and to maintain adequate dietary potassium and calcium preferably from fresh fruits, vegetables, and low-fat dairy products.    stressed the importance of regular exercise  Injury prevention: Discussed safety belts, safety helmets, smoke detector, smoking near bedding or upholstery.   Dental health: Discussed importance of regular tooth brushing, flossing, and dental visits.    NEXT PREVENTATIVE PHYSICAL DUE IN 1 YEAR. Return in about 6 months (around 04/25/2020).

## 2019-10-28 LAB — GC/CHLAMYDIA PROBE AMP
Chlamydia trachomatis, NAA: NEGATIVE
Neisseria Gonorrhoeae by PCR: NEGATIVE

## 2019-10-28 LAB — HIV ANTIBODY (ROUTINE TESTING W REFLEX): HIV Screen 4th Generation wRfx: NONREACTIVE

## 2019-10-28 LAB — COMPREHENSIVE METABOLIC PANEL
ALT: 29 IU/L (ref 0–32)
AST: 17 IU/L (ref 0–40)
Albumin/Globulin Ratio: 1.7 (ref 1.2–2.2)
Albumin: 4.3 g/dL (ref 3.8–4.8)
Alkaline Phosphatase: 48 IU/L (ref 39–117)
BUN/Creatinine Ratio: 10 (ref 9–23)
BUN: 9 mg/dL (ref 6–20)
Bilirubin Total: <0.2 mg/dL (ref 0.0–1.2)
CO2: 21 mmol/L (ref 20–29)
Calcium: 8.7 mg/dL (ref 8.7–10.2)
Chloride: 106 mmol/L (ref 96–106)
Creatinine, Ser: 0.89 mg/dL (ref 0.57–1.00)
GFR calc Af Amer: 98 mL/min/{1.73_m2} (ref >59)
GFR calc non Af Amer: 85 mL/min/{1.73_m2} (ref >59)
Globulin, Total: 2.5 g/dL (ref 1.5–4.5)
Glucose: 99 mg/dL (ref 65–99)
Potassium: 4.6 mmol/L (ref 3.5–5.2)
Sodium: 141 mmol/L (ref 134–144)
Total Protein: 6.8 g/dL (ref 6.0–8.5)

## 2019-10-28 LAB — CBC WITH DIFFERENTIAL/PLATELET
Basophils Absolute: 0.1 10*3/uL (ref 0.0–0.2)
Basos: 1 %
EOS (ABSOLUTE): 0.4 10*3/uL (ref 0.0–0.4)
Eos: 4 %
Hematocrit: 44.4 % (ref 34.0–46.6)
Hemoglobin: 14.5 g/dL (ref 11.1–15.9)
Immature Grans (Abs): 0.1 10*3/uL (ref 0.0–0.1)
Immature Granulocytes: 1 %
Lymphocytes Absolute: 2.2 10*3/uL (ref 0.7–3.1)
Lymphs: 24 %
MCH: 29.5 pg (ref 26.6–33.0)
MCHC: 32.7 g/dL (ref 31.5–35.7)
MCV: 90 fL (ref 79–97)
Monocytes Absolute: 0.6 10*3/uL (ref 0.1–0.9)
Monocytes: 6 %
Neutrophils Absolute: 6.1 10*3/uL (ref 1.4–7.0)
Neutrophils: 64 %
Platelets: 239 10*3/uL (ref 150–450)
RBC: 4.92 x10E6/uL (ref 3.77–5.28)
RDW: 12.3 % (ref 11.7–15.4)
WBC: 9.4 10*3/uL (ref 3.4–10.8)

## 2019-10-28 LAB — LIPID PANEL W/O CHOL/HDL RATIO
Cholesterol, Total: 188 mg/dL (ref 100–199)
HDL: 47 mg/dL (ref >39)
LDL Chol Calc (NIH): 127 mg/dL — ABNORMAL HIGH (ref 0–99)
Triglycerides: 77 mg/dL (ref 0–149)
VLDL Cholesterol Cal: 14 mg/dL (ref 5–40)

## 2019-10-28 LAB — HSV(HERPES SIMPLEX VRS) I + II AB-IGG
HSV 1 Glycoprotein G Ab, IgG: 59.3 index — ABNORMAL HIGH (ref 0.00–0.90)
HSV 2 IgG, Type Spec: <0.91 index (ref 0.00–0.90)

## 2019-10-28 LAB — HEPATITIS PANEL, ACUTE
Hep A IgM: NEGATIVE
Hep B C IgM: NEGATIVE
Hep C Virus Ab: 0.1 s/co ratio (ref 0.0–0.9)
Hepatitis B Surface Ag: NEGATIVE

## 2019-10-28 LAB — CYTOLOGY - PAP
Comment: NEGATIVE
Diagnosis: NEGATIVE
High risk HPV: NEGATIVE

## 2019-10-28 LAB — RPR: RPR Ser Ql: NONREACTIVE

## 2019-10-28 LAB — TSH: TSH: 1.36 u[IU]/mL (ref 0.450–4.500)

## 2019-10-29 ENCOUNTER — Telehealth: Payer: Self-pay | Admitting: Family Medicine

## 2019-10-29 LAB — UA/M W/RFLX CULTURE, ROUTINE
Bilirubin, UA: NEGATIVE
Glucose, UA: NEGATIVE
Nitrite, UA: NEGATIVE
Protein,UA: NEGATIVE
RBC, UA: NEGATIVE
Specific Gravity, UA: 1.02 (ref 1.005–1.030)
Urobilinogen, Ur: 0.2 mg/dL (ref 0.2–1.0)
pH, UA: 5.5 (ref 5.0–7.5)

## 2019-10-29 LAB — MICROSCOPIC EXAMINATION
Bacteria, UA: NONE SEEN
RBC, Urine: NONE SEEN /hpf (ref 0–2)

## 2019-10-29 LAB — URINE CULTURE, REFLEX: Organism ID, Bacteria: NO GROWTH

## 2019-10-29 MED ORDER — METRONIDAZOLE 500 MG PO TABS: 1000.0000 mg | ORAL_TABLET | Freq: Once | ORAL | 0 refills | Status: AC

## 2019-10-29 NOTE — Telephone Encounter (Signed)
Patient notified

## 2019-10-29 NOTE — Telephone Encounter (Signed)
Please let her know that her labs came back normal except she tested positive for the cold sore virus and trichomonas. I've sent her through an antibiotic for the trich and her partner also should be tested before they resume sexual activity. Her pap was normal so she's good for 5 years. Thanks!

## 2019-10-29 NOTE — Telephone Encounter (Signed)
Only the oral cold sore. No genital herpes

## 2019-10-29 NOTE — Telephone Encounter (Signed)
Patient notified. Patient would like to speak with Dr. Wynetta Emery about her the cold sore virus results. Patient wants to know if the cold sore virus is in her mouth or genitals. Please advise

## 2019-11-03 ENCOUNTER — Encounter: Payer: Self-pay | Admitting: Family Medicine

## 2019-11-04 ENCOUNTER — Ambulatory Visit
Admission: RE | Admit: 2019-11-04 | Discharge: 2019-11-04 | Disposition: A | Discharge status: Home / Self Care | Payer: Managed Care, Other (non HMO) | Source: Ambulatory Visit | Attending: Family Medicine | Admitting: Family Medicine

## 2019-11-04 ENCOUNTER — Encounter: Payer: Self-pay | Admitting: Family Medicine

## 2019-11-04 ENCOUNTER — Other Ambulatory Visit: Payer: Self-pay

## 2019-11-04 ENCOUNTER — Telehealth (INDEPENDENT_AMBULATORY_CARE_PROVIDER_SITE_OTHER): Payer: Managed Care, Other (non HMO) | Admitting: Family Medicine

## 2019-11-04 DIAGNOSIS — M79644 Pain in right finger(s): Secondary | ICD-10-CM

## 2019-11-04 MED ORDER — NAPROXEN 500 MG PO TABS: 500.0000 mg | ORAL_TABLET | Freq: Two times a day (BID) | ORAL | 3 refills | Status: DC

## 2019-11-04 NOTE — Progress Notes (Signed)
There were no vitals taken for this visit.   Subjective:    Patient ID: Joan Heath, female    DOB: 1986-07-10, 34 y.o.   MRN: [id number]  HPI: Joan Heath is a 34 y.o. female  Chief Complaint  Patient presents with  . Hand Pain    right, x a few weeks, pain has gotten worse over the last week   HAND PAIN Duration: few weeks Involved hand: right- thumb into the base of her hand Mechanism of injury: unknown Location: palmar Onset: gradual Severity: severe  Quality: aching and sore, sharp with putting pressure on it Frequency: constant Radiation: yes- into the wrist Aggravating factors: twisting and pushing  Alleviating factors: heat Treatments attempted: ibuprofen Relief with NSAIDs?: mild Weakness: yes Numbness: no Redness: no Swelling:yes Bruising: no Fevers: no   Relevant past medical, surgical, family and social history reviewed and updated as indicated. Interim medical history since our last visit reviewed. Allergies and medications reviewed and updated.  Review of Systems  Constitutional: Negative.   Respiratory: Negative.   Cardiovascular: Negative.   Musculoskeletal: Positive for arthralgias and myalgias. Negative for back pain, gait problem, joint swelling, neck pain and neck stiffness.  Skin: Negative.   Neurological: Positive for weakness and numbness. Negative for dizziness, tremors, seizures, syncope, facial asymmetry, speech difficulty, light-headedness and headaches.  Psychiatric/Behavioral: Negative.     Per HPI unless specifically indicated above     Objective:    There were no vitals taken for this visit.  Wt Readings from Last 3 Encounters:  10/27/19 197 lb 4 oz (89.5 kg)  08/24/19 198 lb (89.8 kg)  03/06/18 216 lb (98 kg)    Physical Exam Vitals and nursing note reviewed.  Constitutional:      General: She is not in acute distress.    Appearance: Normal appearance. She is not ill-appearing, toxic-appearing or diaphoretic.  HENT:       Head: Normocephalic and atraumatic.     Right Ear: External ear normal.     Left Ear: External ear normal.     Nose: Nose normal.     Mouth/Throat:     Mouth: Mucous membranes are moist.     Pharynx: Oropharynx is clear.  Eyes:     General: No scleral icterus.       Right eye: No discharge.        Left eye: No discharge.     Conjunctiva/sclera: Conjunctivae normal.     Pupils: Pupils are equal, round, and reactive to light.  Pulmonary:     Effort: Pulmonary effort is normal. No respiratory distress.     Comments: Speaking in full sentences Musculoskeletal:        General: Normal range of motion.     Cervical back: Normal range of motion.  Skin:    Coloration: Skin is not jaundiced or pale.     Findings: No bruising, erythema, lesion or rash.  Neurological:     Mental Status: She is alert and oriented to person, place, and time. Mental status is at baseline.  Psychiatric:        Mood and Affect: Mood normal.        Behavior: Behavior normal.        Thought Content: Thought content normal.        Judgment: Judgment normal.     Results for orders placed or performed in visit on 10/27/19  GC/Chlamydia Probe Amp([facility])   Specimen: Urine   UR  Result Value Ref Range  Chlamydia trachomatis, NAA Negative Negative   Neisseria Gonorrhoeae by PCR Negative Negative  Microscopic Examination   URINE  Result Value Ref Range   WBC, UA 0-5 0 - 5 /hpf   RBC None seen 0 - 2 /hpf   Epithelial Cells (non renal) 0-10 0 - 10 /hpf   Bacteria, UA None seen None seen/Few   Trichomonas, UA Present None seen  Urine Culture, Reflex   URINE  Result Value Ref Range   Urine Culture, Routine Final report    Organism ID, Bacteria No growth   CBC with Differential OUT  Result Value Ref Range   WBC 9.4 3.4 - 10.8 x10E3/uL   RBC 4.92 3.77 - 5.28 x10E6/uL   Hemoglobin 14.5 11.1 - 15.9 g/dL   Hematocrit 44.4 34.0 - 46.6 %   MCV 90 79 - 97 fL   MCH 29.5 26.6 - 33.0 pg   MCHC 32.7 31.5 -  35.7 g/dL   RDW 12.3 11.7 - 15.4 %   Platelets 239 150 - 450 x10E3/uL   Neutrophils 64 Not Estab. %   Lymphs 24 Not Estab. %   Monocytes 6 Not Estab. %   Eos 4 Not Estab. %   Basos 1 Not Estab. %   Neutrophils Absolute 6.1 1.4 - 7.0 x10E3/uL   Lymphocytes Absolute 2.2 0.7 - 3.1 x10E3/uL   Monocytes Absolute 0.6 0.1 - 0.9 x10E3/uL   EOS (ABSOLUTE) 0.4 0.0 - 0.4 x10E3/uL   Basophils Absolute 0.1 0.0 - 0.2 x10E3/uL   Immature Granulocytes 1 Not Estab. %   Immature Grans (Abs) 0.1 0.0 - 0.1 x10E3/uL  Comp Met (CMET)  Result Value Ref Range   Glucose 99 65 - 99 mg/dL   BUN 9 6 - 20 mg/dL   Creatinine, Ser 0.89 0.57 - 1.00 mg/dL   GFR calc non Af Amer 85 >59 mL/min/1.73   GFR calc Af Amer 98 >59 mL/min/1.73   BUN/Creatinine Ratio 10 9 - 23   Sodium 141 134 - 144 mmol/L   Potassium 4.6 3.5 - 5.2 mmol/L   Chloride 106 96 - 106 mmol/L   CO2 21 20 - 29 mmol/L   Calcium 8.7 8.7 - 10.2 mg/dL   Total Protein 6.8 6.0 - 8.5 g/dL   Albumin 4.3 3.8 - 4.8 g/dL   Globulin, Total 2.5 1.5 - 4.5 g/dL   Albumin/Globulin Ratio 1.7 1.2 - 2.2   Bilirubin Total <0.2 0.0 - 1.2 mg/dL   Alkaline Phosphatase 48 39 - 117 IU/L   AST 17 0 - 40 IU/L   ALT 29 0 - 32 IU/L  Lipid Panel w/o Chol/HDL Ratio OUT  Result Value Ref Range   Cholesterol, Total 188 100 - 199 mg/dL   Triglycerides 77 0 - 149 mg/dL   HDL 47 >39 mg/dL   VLDL Cholesterol Cal 14 5 - 40 mg/dL   LDL Chol Calc (NIH) 127 (H) 0 - 99 mg/dL  TSH  Result Value Ref Range   TSH 1.360 0.450 - 4.500 uIU/mL  UA/M w/rflx Culture, Routine   Specimen: Urine   URINE  Result Value Ref Range   Specific Gravity, UA 1.020 1.005 - 1.030   pH, UA 5.5 5.0 - 7.5   Color, UA Yellow Yellow   Appearance Ur Clear Clear   Leukocytes,UA 1+ (A) Negative   Protein,UA Negative Negative/Trace   Glucose, UA Negative Negative   Ketones, UA Trace (A) Negative   RBC, UA Negative Negative   Bilirubin, UA Negative Negative  Urobilinogen, Ur 0.2 0.2 - 1.0 mg/dL    Nitrite, UA Negative Negative   Microscopic Examination See below:    Urinalysis Reflex Comment   HIV antibody (with reflex)  Result Value Ref Range   HIV Screen 4th Generation wRfx Non Reactive Non Reactive  HSV(herpes simplex vrs) 1+2 ab-IgG  Result Value Ref Range   HSV 1 Glycoprotein G Ab, IgG 59.30 (H) 0.00 - 0.90 index   HSV 2 IgG, Type Spec <0.91 0.00 - 0.90 index  Hepatitis, Acute  Result Value Ref Range   Hep A IgM Negative Negative   Hepatitis B Surface Ag Negative Negative   Hep B C IgM Negative Negative   Hep C Virus Ab 0.1 0.0 - 0.9 s/co ratio  RPR  Result Value Ref Range   RPR Ser Ql Non Reactive Non Reactive  Pap over 30  Result Value Ref Range   High risk HPV Negative    Adequacy      Satisfactory for evaluation; transformation zone component PRESENT.   Diagnosis      - Negative for intraepithelial lesion or malignancy (NILM)   Microorganisms Trichomonas vaginalis present    Comment Normal Reference Range HPV - Negative       Assessment & Plan:   Problem List Items Addressed This Visit    None    Visit Diagnoses    Pain of right thumb    -  Primary   Will check x-ray and start naproxen. Call if not getting better or getting worse. Continue to monitor.   Relevant Orders   DG Hand Complete Right       Follow up plan: Return if symptoms worsen or fail to improve.   . This visit was completed via Mychart due to the restrictions of the COVID-19 pandemic. All issues as above were discussed and addressed. Physical exam was done as above through visual confirmation on MyChart. If it was felt that the patient should be evaluated in the office, they were directed there. The patient verbally consented to this visit. . Location of the patient: work . Location of the provider: home . Those involved with this call:  . Provider: Park Liter, DO . CMA: Tiffany Reel, CMA . Front Desk/Registration: Don Perking  . Time spent on call: 15 minutes with  patient face to face via video conference. More than 50% of this time was spent in counseling and coordination of care. 23 minutes total spent in review of patient's record and preparation of their chart.

## 2019-11-05 ENCOUNTER — Encounter: Payer: Self-pay | Admitting: Family Medicine

## 2019-11-23 ENCOUNTER — Encounter: Payer: Self-pay | Admitting: Family Medicine

## 2019-11-23 DIAGNOSIS — M79644 Pain in right finger(s): Secondary | ICD-10-CM

## 2020-04-25 ENCOUNTER — Ambulatory Visit (INDEPENDENT_AMBULATORY_CARE_PROVIDER_SITE_OTHER): Payer: 59 | Admitting: Family Medicine

## 2020-04-25 ENCOUNTER — Other Ambulatory Visit: Payer: Self-pay

## 2020-04-25 ENCOUNTER — Encounter: Payer: Self-pay | Admitting: Family Medicine

## 2020-04-25 VITALS — BP 125/76 | HR 78 | Temp 98.6°F | Wt 200.6 lb

## 2020-04-25 DIAGNOSIS — Z803 Family history of malignant neoplasm of breast: Secondary | ICD-10-CM | POA: Diagnosis not present

## 2020-04-25 DIAGNOSIS — F3170 Bipolar disorder, currently in remission, most recent episode unspecified: Secondary | ICD-10-CM

## 2020-04-25 DIAGNOSIS — F419 Anxiety disorder, unspecified: Secondary | ICD-10-CM

## 2020-04-25 DIAGNOSIS — G43009 Migraine without aura, not intractable, without status migrainosus: Secondary | ICD-10-CM

## 2020-04-25 MED ORDER — QUETIAPINE FUMARATE ER 50 MG PO TB24: 50.0000 mg | ORAL_TABLET | Freq: Every day | ORAL | 3 refills | Status: DC

## 2020-04-25 MED ORDER — BUSPIRONE HCL 7.5 MG PO TABS: 7.5000 mg | ORAL_TABLET | Freq: Three times a day (TID) | ORAL | 1 refills | Status: DC

## 2020-04-25 NOTE — Assessment & Plan Note (Signed)
Under good control on current regimen. Continue current regimen. Continue to monitor. Call with any concerns. Refills given.   

## 2020-04-25 NOTE — Assessment & Plan Note (Signed)
Due for repeat mammogram. Order in. Information given to patient today.

## 2020-04-25 NOTE — Progress Notes (Signed)
BP 125/76 (BP Location: Left Arm, Patient Position: Sitting, Cuff Size: Normal)   Pulse 78   Temp 98.6 F (37 C) (Oral)   Wt 200 lb 9.6 oz (91 kg)   SpO2 99%   BMI 31.93 kg/m    Subjective:    Patient ID: Joan Heath, female    DOB: 1986-01-19, 34 y.o.   MRN: [id number]  HPI: Joan Heath is a 34 y.o. female  Chief Complaint  Patient presents with  . Anxiety    has gotten worse.   . Migraine   ANXIETY/BIPOLAR- grandmother is battling bladder cancer, she has been very upset about this and also in the middle of a lot of family drama regarding it. She notes that she can't even go see her grandmother because she is afraid to see her that way. Has been crying a lot and feeling a lot worse Duration:exacerbated Anxious mood: yes  Excessive worrying: yes Irritability: yes  Sweating: no Nausea: no Palpitations:no Hyperventilation: no Panic attacks: no Agoraphobia: no  Obscessions/compulsions: yes Depressed mood: yes Depression screen [facility] 2/9 04/25/2020 10/27/2019 12/31/2017 10/01/2017  Decreased Interest 1 0 1 1  Down, Depressed, Hopeless 1 0 1 1  PHQ - 2 Score 2 0 2 2  Altered sleeping 3 1 3  -  Tired, decreased energy 2 1 2  -  Change in appetite 2 0 1 -  Feeling bad or failure about yourself  1 0 1 -  Trouble concentrating 1 0 0 -  Moving slowly or fidgety/restless 1 0 0 -  Suicidal thoughts 0 0 1 -  PHQ-9 Score 12 2 10  -  Difficult doing work/chores Very difficult Not difficult at all Somewhat difficult -   GAD 7 : Generalized Anxiety Score 04/25/2020 10/27/2019 10/01/2017  Nervous, Anxious, on Edge 2 1 3   Control/stop worrying 2 1 1   Worry too much - different things 2 0 3  Trouble relaxing 1 1 2   Restless 1 0 1  Easily annoyed or irritable 3 1 3   Afraid - awful might happen 2 0 3  Total GAD 7 Score 13 4 16   Anxiety Difficulty Somewhat difficult Not difficult at all -   Anhedonia: no Weight changes: no Insomnia: yes   Hypersomnia: no Fatigue/loss of energy:  yes Feelings of worthlessness: yes Feelings of guilt: yes Impaired concentration/indecisiveness: yes Suicidal ideations: no  Crying spells: yes Recent Stressors/Life Changes: yes   Relationship problems: no   Family stress: yes     Financial stress: no    Job stress: yes    Recent death/loss/illness: yes  Migraines have been doing well. Headaches have been up a bit with the stress, but in general under good control. Tolerating her gabapentin well. No other concerns.   Relevant past medical, surgical, family and social history reviewed and updated as indicated. Interim medical history since our last visit reviewed. Allergies and medications reviewed and updated.  Review of Systems  Constitutional: Negative.   Respiratory: Negative.   Cardiovascular: Negative.   Gastrointestinal: Negative.   Skin: Negative.   Neurological: Positive for headaches. Negative for dizziness, tremors, seizures, syncope, facial asymmetry, speech difficulty, weakness, light-headedness and numbness.  Hematological: Negative.   Psychiatric/Behavioral: Positive for dysphoric mood. Negative for agitation, behavioral problems, confusion, decreased concentration, hallucinations, self-injury, sleep disturbance and suicidal ideas. The patient is nervous/anxious. The patient is not hyperactive.     Per HPI unless specifically indicated above     Objective:    BP 125/76 (BP Location: Left Arm, Patient  Position: Sitting, Cuff Size: Normal)   Pulse 78   Temp 98.6 F (37 C) (Oral)   Wt 200 lb 9.6 oz (91 kg)   SpO2 99%   BMI 31.93 kg/m   Wt Readings from Last 3 Encounters:  04/25/20 200 lb 9.6 oz (91 kg)  10/27/19 197 lb 4 oz (89.5 kg)  08/24/19 198 lb (89.8 kg)    Physical Exam Vitals and nursing note reviewed.  Constitutional:      General: She is not in acute distress.    Appearance: Normal appearance. She is not ill-appearing, toxic-appearing or diaphoretic.  HENT:     Head: Normocephalic and  atraumatic.     Right Ear: External ear normal.     Left Ear: External ear normal.     Nose: Nose normal.     Mouth/Throat:     Mouth: Mucous membranes are moist.     Pharynx: Oropharynx is clear.  Eyes:     General: No scleral icterus.       Right eye: No discharge.        Left eye: No discharge.     Extraocular Movements: Extraocular movements intact.     Conjunctiva/sclera: Conjunctivae normal.     Pupils: Pupils are equal, round, and reactive to light.  Cardiovascular:     Rate and Rhythm: Normal rate and regular rhythm.     Pulses: Normal pulses.     Heart sounds: Normal heart sounds. No murmur heard.  No friction rub. No gallop.   Pulmonary:     Effort: Pulmonary effort is normal. No respiratory distress.     Breath sounds: Normal breath sounds. No stridor. No wheezing, rhonchi or rales.  Chest:     Chest wall: No tenderness.  Musculoskeletal:        General: Normal range of motion.     Cervical back: Normal range of motion and neck supple.  Skin:    General: Skin is warm and dry.     Capillary Refill: Capillary refill takes less than 2 seconds.     Coloration: Skin is not jaundiced or pale.     Findings: No bruising, erythema, lesion or rash.  Neurological:     General: No focal deficit present.     Mental Status: She is alert and oriented to person, place, and time. Mental status is at baseline.  Psychiatric:        Mood and Affect: Mood normal.        Behavior: Behavior normal.        Thought Content: Thought content normal.        Judgment: Judgment normal.     Results for orders placed or performed in visit on 10/27/19  GC/Chlamydia Probe Amp([facility])   Specimen: Urine   UR  Result Value Ref Range   Chlamydia trachomatis, NAA Negative Negative   Neisseria Gonorrhoeae by PCR Negative Negative  Microscopic Examination   URINE  Result Value Ref Range   WBC, UA 0-5 0 - 5 /hpf   RBC None seen 0 - 2 /hpf   Epithelial Cells (non renal) 0-10 0 - 10 /hpf    Bacteria, UA None seen None seen/Few   Trichomonas, UA Present None seen  Urine Culture, Reflex   URINE  Result Value Ref Range   Urine Culture, Routine Final report    Organism ID, Bacteria No growth   CBC with Differential OUT  Result Value Ref Range   WBC 9.4 3.4 - 10.8 x10E3/uL  RBC 4.92 3.77 - 5.28 x10E6/uL   Hemoglobin 14.5 11.1 - 15.9 g/dL   Hematocrit 44.4 34.0 - 46.6 %   MCV 90 79 - 97 fL   MCH 29.5 26.6 - 33.0 pg   MCHC 32.7 31 - 35 g/dL   RDW 12.3 11.7 - 15.4 %   Platelets 239 150 - 450 x10E3/uL   Neutrophils 64 Not Estab. %   Lymphs 24 Not Estab. %   Monocytes 6 Not Estab. %   Eos 4 Not Estab. %   Basos 1 Not Estab. %   Neutrophils Absolute 6.1 1 - 7 x10E3/uL   Lymphocytes Absolute 2.2 0 - 3 x10E3/uL   Monocytes Absolute 0.6 0 - 0 x10E3/uL   EOS (ABSOLUTE) 0.4 0.0 - 0.4 x10E3/uL   Basophils Absolute 0.1 0 - 0 x10E3/uL   Immature Granulocytes 1 Not Estab. %   Immature Grans (Abs) 0.1 0.0 - 0.1 x10E3/uL  Comp Met (CMET)  Result Value Ref Range   Glucose 99 65 - 99 mg/dL   BUN 9 6 - 20 mg/dL   Creatinine, Ser 0.89 0.57 - 1.00 mg/dL   GFR calc non Af Amer 85 >59 mL/min/1.73   GFR calc Af Amer 98 >59 mL/min/1.73   BUN/Creatinine Ratio 10 9 - 23   Sodium 141 134 - 144 mmol/L   Potassium 4.6 3.5 - 5.2 mmol/L   Chloride 106 96 - 106 mmol/L   CO2 21 20 - 29 mmol/L   Calcium 8.7 8.7 - 10.2 mg/dL   Total Protein 6.8 6.0 - 8.5 g/dL   Albumin 4.3 3.8 - 4.8 g/dL   Globulin, Total 2.5 1.5 - 4.5 g/dL   Albumin/Globulin Ratio 1.7 1.2 - 2.2   Bilirubin Total <0.2 0.0 - 1.2 mg/dL   Alkaline Phosphatase 48 39 - 117 IU/L   AST 17 0 - 40 IU/L   ALT 29 0 - 32 IU/L  Lipid Panel w/o Chol/HDL Ratio OUT  Result Value Ref Range   Cholesterol, Total 188 100 - 199 mg/dL   Triglycerides 77 0 - 149 mg/dL   HDL 47 >39 mg/dL   VLDL Cholesterol Cal 14 5 - 40 mg/dL   LDL Chol Calc (NIH) 127 (H) 0 - 99 mg/dL  TSH  Result Value Ref Range   TSH 1.360 0.450 - 4.500 uIU/mL  UA/M  w/rflx Culture, Routine   Specimen: Urine   URINE  Result Value Ref Range   Specific Gravity, UA 1.020 1.005 - 1.030   pH, UA 5.5 5.0 - 7.5   Color, UA Yellow Yellow   Appearance Ur Clear Clear   Leukocytes,UA 1+ (A) Negative   Protein,UA Negative Negative/Trace   Glucose, UA Negative Negative   Ketones, UA Trace (A) Negative   RBC, UA Negative Negative   Bilirubin, UA Negative Negative   Urobilinogen, Ur 0.2 0.2 - 1.0 mg/dL   Nitrite, UA Negative Negative   Microscopic Examination See below:    Urinalysis Reflex Comment   HIV antibody (with reflex)  Result Value Ref Range   HIV Screen 4th Generation wRfx Non Reactive Non Reactive  HSV(herpes simplex vrs) 1+2 ab-IgG  Result Value Ref Range   HSV 1 Glycoprotein G Ab, IgG 59.30 (H) 0.00 - 0.90 index   HSV 2 IgG, Type Spec <0.91 0.00 - 0.90 index  Hepatitis, Acute  Result Value Ref Range   Hep A IgM Negative Negative   Hepatitis B Surface Ag Negative Negative   Hep B C IgM Negative  Negative   Hep C Virus Ab 0.1 0.0 - 0.9 s/co ratio  RPR  Result Value Ref Range   RPR Ser Ql Non Reactive Non Reactive  Pap over 30  Result Value Ref Range   High risk HPV Negative    Adequacy      Satisfactory for evaluation; transformation zone component PRESENT.   Diagnosis      - Negative for intraepithelial lesion or malignancy (NILM)   Microorganisms Trichomonas vaginalis present    Comment Normal Reference Range HPV - Negative       Assessment & Plan:   Problem List Items Addressed This Visit      Cardiovascular and Mediastinum   Migraine without aura    Under good control on current regimen. Continue current regimen. Continue to monitor. Call with any concerns. Refills given.          Other   Bipolar disorder ([place]) - Primary    In exacerbation, depressed and anxious due to grandmother's illness. Will start XR seroquel 36m and recheck 2-3 weeks. Continue buspar. Call with any concerns.       Relevant Orders   Referral to  Chronic Care Management Services   Anxiety    In exacerbation, depressed and anxious due to grandmother's illness. Will start XR seroquel 583mand recheck 2-3 weeks. Continue buspar. Call with any concerns.       Relevant Medications   busPIRone (BUSPAR) 7.5 MG tablet   Other Relevant Orders   Referral to Chronic Care Management Services   FH: breast cancer in sister diagnosed at age 35  Due for repeat mammogram. Order in. Information given to patient today.          Follow up plan: Return 2-3 weeks, for follow up mood.

## 2020-04-25 NOTE — Patient Instructions (Signed)
[facility]  Address: [address], , [place] [address]  Phone: [phone]  

## 2020-04-25 NOTE — Assessment & Plan Note (Signed)
In exacerbation, depressed and anxious due to grandmother's illness. Will start XR seroquel 50mg  and recheck 2-3 weeks. Continue buspar. Call with any concerns.

## 2020-05-02 ENCOUNTER — Ambulatory Visit: Payer: 59 | Admitting: Licensed Clinical Social Worker

## 2020-05-03 NOTE — Chronic Care Management (AMB) (Signed)
Visit Information  Goals Addressed    .  "I may lose someone very important to me." (pt-stated)        Current Barriers:  . Chronic Mental Health needs related to bipolar, anxiety and possible grief . Limited social support . Mental Health Concerns  . Social Isolation . Suicidal Ideation/Homicidal Ideation: No  Clinical Social Work Goal(s):   Marland Kitchen Over the next 120 days, patient/caregiver will work with SW to address concerns related to gaining additional support/resource connection and education in order to maintain health and mental health appropriately  . Over the next 120 days, patient will demonstrate improved adherence to self care as evidenced by implementing healthy self-care into her daily routine such as: attending all medical appointments, deep breathing exercises, taking time for self-reflection, taking medications as prescribed, drinking water and daily exercise to improve mobility.  . Over the next 120 days, patient will demonstrate improved health management independence as evidenced by implementing healthy self-care and positive support/resources into her daily routine to cope with stressors and improve overall health and well-being   Interventions: . Patient interviewed and appropriate assessments performed: brief mental health assessment . Patient's grandmother is very sick and recently discharged from the hospital. Patient is struggling with dealing with added stressor. Patient denies needing mental health or grief support at this time but was appreciative of education provided during session. . Patient interviewed and appropriate assessments performed . Provided mental health counseling with regard to anxiety management (mental health diagnosis or concern) . Provided patient with information about available grief support resources within her nearby area . Discussed plans with patient for ongoing care management follow up and provided patient with direct contact information for  care management team . Advised patient to contact CCM LCSW for any urgent social work needs . Assisted patient/caregiver with obtaining information about health plan benefits . Provided education to patient/caregiver about Hospice and/or Palliative Care services . Referred patient to [facility] (mental health provider) for long term follow up and therapy/counseling . Brief CBT provided throughout session  Patient Self Care Activities:  . Calls provider office for new concerns or questions . Ability for insight . Independent living . Motivation for treatment  Patient Coping Strengths:  . Supportive Relationships . Hopefulness . Self Advocate . Able to Communicate Effectively  Patient Self Care Deficits:  . Lacks social connections  Initial goal documentation      Joan Heath was given information about Care Management services today including:  1. Care Management services include personalized support from designated clinical staff supervised by her physician, including individualized plan of care and coordination with other care providers 2. 24/7 contact phone numbers for assistance for urgent and routine care needs. 3. The patient may stop CCM services at any time (effective at the end of the month) by phone call to the office staff.  Patient agreed to services and verbal consent obtained.   [name], BSW, MSW, [facility] Care Management [email or web address] Phone: [phone]

## 2020-05-10 ENCOUNTER — Ambulatory Visit: Payer: 59 | Admitting: Family Medicine

## 2020-05-13 ENCOUNTER — Ambulatory Visit: Payer: 59 | Admitting: Family Medicine

## 2020-05-16 ENCOUNTER — Ambulatory Visit: Payer: 59 | Admitting: Licensed Clinical Social Worker

## 2020-05-16 DIAGNOSIS — Z87898 Personal history of other specified conditions: Secondary | ICD-10-CM

## 2020-05-16 NOTE — Chronic Care Management (AMB) (Signed)
Care Management   Follow Up Note   05/16/2020 Name: Joan Heath MRN: [id number] DOB: 05/09/1986  Referred by: Valerie Roys, DO Reason for referral : Care Coordination   Joan Heath is a 34 y.o. year old female who is a primary care patient of Valerie Roys, DO. The care management team was consulted for assistance with care management and care coordination needs.    Review of patient status, including review of consultants reports, relevant laboratory and other test results, and collaboration with appropriate care team members and the patient's provider was performed as part of comprehensive patient evaluation and provision of chronic care management services.    SDOH (Social Determinants of Health) assessments performed: Yes See Care Plan activities for detailed interventions related to [facility])     Advanced Directives: See Care Plan and Vynca application for related entries.   Goals Addressed    .  "I may lose someone very important to me." (pt-stated)        Current Barriers:  . Chronic Mental Health needs related to bipolar, anxiety and possible grief . Limited social support . Mental Health Concerns  . Social Isolation . Suicidal Ideation/Homicidal Ideation: No  Clinical Social Work Goal(s):   Marland Kitchen Over the next 120 days, patient/caregiver will work with SW to address concerns related to gaining additional support/resource connection and education in order to maintain health and mental health appropriately  . Over the next 120 days, patient will demonstrate improved adherence to self care as evidenced by implementing healthy self-care into her daily routine such as: attending all medical appointments, deep breathing exercises, taking time for self-reflection, taking medications as prescribed, drinking water and daily exercise to improve mobility.  . Over the next 120 days, patient will demonstrate improved health management independence as evidenced by implementing healthy  self-care and positive support/resources into her daily routine to cope with stressors and improve overall health and well-being   Interventions: . Patient interviewed and appropriate assessments performed: brief mental health assessment . Patient's grandmother is very sick and recently discharged from the hospital on 05/14/20 and is staying with patient's uncle. Patient is struggling with dealing with added stressor. Patient denies needing mental health or grief support at this time but was appreciative of education provided during session. . Patient interviewed and appropriate assessments performed . Provided mental health counseling with regard to anxiety management (mental health diagnosis or concern) . Patient states having strong support through one of her coworkers. She reports that this coworker can help calm her down effectively when she is experiencing sudden anxiety.  . Provided patient with information about available mental health resources within her nearby area . Discussed plans with patient for ongoing care management follow up and provided patient with direct contact information for care management team . Advised patient to contact CCM LCSW for any urgent social work needs . Assisted patient/caregiver with obtaining information about health plan benefits . Provided education to patient/caregiver about Hospice and/or Palliative Care services . LCSW provided education on relaxation techniques such as meditation, deep breathing, massage, grounding exercsies or yoga that can activate the body's relaxation response and ease symptoms of extreme anxiety. LCSW  ask that when pt is struggling with difficult emotions and traumatic memories that she start this relaxation response process.  . Referred patient to [place] (mental health provider) for long term follow up and therapy/counseling . Brief CBT provided throughout session . Patient shares that she has been experiencing increased  panic attacks. She shares  that she experienced one when she went to a visitation with her partner. Patient reports having multiple panic attacks at work and explained to [employer] that once one arises at work, her coworkers will get up and try to [employer] her which lead to her getting written up for causing a distraction within the work environment. Patient reports that she is interested in signing a consent form for [employer] to release her DX list to her employer. LCSW will update PCP.  Marland Kitchen LCSW completed C3 Guide referral on 05/16/20-Patient is having ongoing mental health issues and needs to see PCP for follow up. However, she is having a very hard time managing her bills and feels she may have to cancel her 05/30/20 appointment because she does not have enough money to pay her copay that she was unable to afford last time. Patient would greatly benefit from any financial assistance support and resource connection to help give her more wiggle room within her income so that she can manage her mental health and health needs appropriately . LCSW completed Pharmacy referral on 05/16/20. Patient has been unable to get seroquel XR due to financial concerns. LCSW will ask for assistance for this specific request.   Patient Self Care Activities:  . Calls provider office for new concerns or questions . Ability for insight . Independent living . Motivation for treatment  Patient Coping Strengths:  . Supportive Relationships . Hopefulness . Self Advocate . Able to Communicate Effectively  Patient Self Care Deficits:  . Lacks social connections  Please see past updates related to this goal by clicking on the "Past Updates" button in the selected goal       The care management team will reach out to the patient again over the next 60 days.   [name], BSW, MSW, [facility] Care Management [email or web address] Phone:  [phone]

## 2020-05-17 ENCOUNTER — Telehealth: Payer: Self-pay | Admitting: *Deleted

## 2020-05-17 NOTE — Chronic Care Management (AMB) (Signed)
°  Care Management   Note  05/17/2020 Name: Joan Heath MRN: [id number] DOB: [birth year]  Joan Heath is a 34 y.o. year old female who is a primary care patient of Valerie Roys, DO and is actively engaged with the care management team. I reached out to [name] by phone today to assist with scheduling an initial visit with the Pharmacist  Follow up plan: Telephone appointment with care management team member scheduled for:05/27/2020  [place], [place] Management  [place], [place] [address] Direct Dial: [email or web address] Website: [email or web address]

## 2020-05-27 ENCOUNTER — Ambulatory Visit: Payer: 59 | Admitting: Pharmacist

## 2020-05-27 NOTE — Progress Notes (Signed)
Joan Heath, I think you need to make this so I can co-sign with the attestation. Thanks!

## 2020-05-27 NOTE — Progress Notes (Signed)
Jena Gauss, I think you need to add the cosign on these so I can add my attestation.

## 2020-05-27 NOTE — Chronic Care Management (AMB) (Addendum)
  Care Management   Initial Visit Note  05/27/2020 Name: Joan Heath MRN: [id number] DOB: 06-08-1986  Subjective:   Objective:  Assessment: Joan Heath is a 34 y.o. year old female who sees Joan Roys, DO for primary care. The care management team was consulted for assistance with care management and care coordination needs related to Medication Assistance .   Review of patient status, including review of consultants reports, relevant laboratory and other test results, and collaboration with appropriate care team members and the patient's provider was performed as part of comprehensive patient evaluation and provision of care management services.    SDOH (Social Determinants of Health) assessments performed: Yes See Care Plan activities for detailed interventions related to [facility])     Outpatient Encounter Medications as of 05/27/2020  Medication Sig  . busPIRone (BUSPAR) 7.5 MG tablet Take 1-2 tablets (7.5-15 mg total) by mouth 3 (three) times daily.  . QUEtiapine (SEROQUEL XR) 50 MG TB24 24 hr tablet Take 1 tablet (50 mg total) by mouth at bedtime.   No facility-administered encounter medications on file as of 05/27/2020.    Goals Addressed              This Visit's Progress   .  PharmD "My medication is expensive" (pt-stated)        CARE PLAN ENTRY (see longitudinal plan of care for additional care plan information)  Current Barriers:  . Financial Barriers in complicated patient with multiple medical conditions including bipolar disorder and anxiety.Patient has [employer] and reports copay for Seroquel XR is cost prohibitive at this time  Pharmacist Clinical Goal(s):  [name], [name]  Interventions: . Comprehensive medication review completed; medication list updated in electronic medical record.  . Provided patient with manufacturer savings card that can be used in  conjunction with her insurance and should bring out of pocket cost down to </= $3.00.  Patient Self Care Activities:  . Patient will download savings card and present to pharmacy. . Patient will contact PharmD with any questions or concerns.  Initial goal documentation         Follow up plan:  Patient voiced no other concerns at this time. Will not plan scheduled follow up and happy to help with any future needs.    [name] was given information about Care Management services today including:  1. Care Management services include personalized support from designated clinical staff supervised by a physician, including individualized plan of care and coordination with other care providers 2. 24/7 contact phone numbers for assistance for urgent and routine care needs. 3. The patient may stop Care Management services at any time (effective at the end of the month) by phone call to the office staff.  Patient agreed to services and verbal consent obtained.  Junita Push. Kenton Kingfisher PharmD, [facility] [phone]

## 2020-05-27 NOTE — Progress Notes (Deleted)
  Care Management   Initial Visit Note  05/27/2020 Name: Joan Heath MRN: [id number] DOB: May 08, 1986  Subjective:   Objective:  Assessment: Joan Heath is a 34 y.o. year old female who sees Joan Roys, DO for primary care. The care management team was consulted for assistance with care management and care coordination needs related to Medication Assistance .   Review of patient status, including review of consultants reports, relevant laboratory and other test results, and collaboration with appropriate care team members and the patient's provider was performed as part of comprehensive patient evaluation and provision of care management services.    SDOH (Social Determinants of Health) assessments performed: Yes See Care Plan activities for detailed interventions related to [facility])     Outpatient Encounter Medications as of 05/27/2020  Medication Sig  . busPIRone (BUSPAR) 7.5 MG tablet Take 1-2 tablets (7.5-15 mg total) by mouth 3 (three) times daily.  . QUEtiapine (SEROQUEL XR) 50 MG TB24 24 hr tablet Take 1 tablet (50 mg total) by mouth at bedtime.   No facility-administered encounter medications on file as of 05/27/2020.    Goals Addressed              This Visit's Progress   .  PharmD "My medication is expensive" (pt-stated)        CARE PLAN ENTRY (see longitudinal plan of care for additional care plan information)  Current Barriers:  . Financial Barriers in complicated patient with multiple medical conditions including bipolar disorder and anxiety.Patient has [employer] and reports copay for Seroquel XR is cost prohibitive at this time  Pharmacist Clinical Goal(s):  [name], [name]  Interventions: . Comprehensive medication review completed; medication list updated in electronic medical record.  . Provided patient with manufacturer savings card that can be used in  conjunction with her insurance and should bring out of pocket cost down to </= $3.00.  Patient Self Care Activities:  . Patient will download savings card and present to pharmacy. . Patient will contact PharmD with any questions or concerns.  Initial goal documentation         Follow up plan:  Patient voiced no other concerns at this time. Will not follow up and remain available for future consult.  Joan Heath was given information about Care Management services today including:  1. Care Management services include personalized support from designated clinical staff supervised by a physician, including individualized plan of care and coordination with other care providers 2. 24/7 contact phone numbers for assistance for urgent and routine care needs. 3. The patient may stop Care Management services at any time (effective at the end of the month) by phone call to the office staff.  Patient agreed to services and verbal consent obtained.  Junita Push. Kenton Kingfisher PharmD, [facility] [phone]

## 2020-05-30 ENCOUNTER — Ambulatory Visit: Payer: 59 | Admitting: Family Medicine

## 2020-06-06 NOTE — Progress Notes (Signed)
I, as supervising physician, have reviewed the PharmD Chronic Care Management note for this patient and concur with the findings and recommendations listed above.

## 2020-06-07 ENCOUNTER — Ambulatory Visit: Payer: 59 | Admitting: Family Medicine

## 2020-06-24 ENCOUNTER — Telehealth: Payer: Self-pay | Admitting: Family Medicine

## 2020-06-24 NOTE — Telephone Encounter (Signed)
° °  SF 06/24/2020    Name: Joan Heath    MRN: [id number]    DOB: [birth year]    AGE: 34 y.o.    GENDER: female    PCP Park Liter P, DO.   Called pt regarding [name] Referral for financial resources. Patient stated that she is still in need. [name] states that she has not been going to her medical appointments because she can not pay for the co-pays and would be appreciative of any resources that might be able to assist her. Informed patient that Care Guide will research different organization within the area to see if there are any resources available. Patient stated understanding.   Follow up on: 06/27/2020  [place], Care Management Phone: [phone] Email: [email or web address]

## 2020-06-29 NOTE — Telephone Encounter (Signed)
° °  SF 06/29/2020    Name: Joan Heath    MRN: [id number]    DOB: 07-Aug-1986    AGE: 34 y.o.    GENDER: female    PCP Park Liter P, DO.   Called pt regarding [name] Referral for financial resources. Patient did not answer. Could not leave message due to voicemail not picking up. Care Guide was unable to find resources that would be able to pay for co-pays when she visits her doctors office. If patient is still in need of financial resources that can assist with other things such as rent, utilities, or medical bills, etc. She may be able to receive assistance through the [facility], [facility], or [place]. Will try to give patient again at a later time.   Follow up on: 06/30/2020  [place], Care Management Phone: [phone] Email: [email or web address]

## 2020-06-30 NOTE — Telephone Encounter (Signed)
   SF 06/30/2020   Name: Joan Heath   MRN: [id number]   DOB: 08/14/1986   AGE: 34 y.o.   GENDER: female   PCP Park Liter P, DO.   Called patient regarding referral for financial assistance with copays. Left message letting her know that Care Guide was unable to find any resources that will assist with co-pays, but if she needs assistance with medical bills, rent, or utilities she can reach out to [facility], [employer], or [place]. Also let patient know if she has any additional needs she can give the office a call.   Closing referral pending any other needs of patient.  Follow up on: 06/30/2020  [place], Care Management Phone: [phone] Email: [email or web address]

## 2020-07-15 ENCOUNTER — Ambulatory Visit: Payer: 59 | Admitting: Licensed Clinical Social Worker

## 2020-07-15 NOTE — Chronic Care Management (AMB) (Signed)
Care Management   Follow Up Note   07/15/2020 Name: Joan Heath MRN: [id number] DOB: January 20, 1986  Referred by: Valerie Roys, DO Reason for referral : Care Coordination   Joan Heath is a 34 y.o. year old female who is a primary care patient of Valerie Roys, DO. The care management team was consulted for assistance with care management and care coordination needs.    Review of patient status, including review of consultants reports, relevant laboratory and other test results, and collaboration with appropriate care team members and the patient's provider was performed as part of comprehensive patient evaluation and provision of chronic care management services.    SDOH (Social Determinants of Health) assessments performed: Yes See Care Plan activities for detailed interventions related to SDOH)    Goals Addressed    .  "I may lose someone very important to me." (pt-stated)        Current Barriers:  . Chronic Mental Health needs related to bipolar, anxiety and possible grief . Limited social support . Mental Health Concerns  . Social Isolation . Suicidal Ideation/Homicidal Ideation: No  Clinical Social Work Goal(s):   Marland Kitchen Over the next 120 days, patient/caregiver will work with SW to address concerns related to gaining additional support/resource connection and education in order to maintain health and mental health appropriately  . Over the next 120 days, patient will demonstrate improved adherence to self care as evidenced by implementing healthy self-care into her daily routine such as: attending all medical appointments, deep breathing exercises, taking time for self-reflection, taking medications as prescribed, drinking water and daily exercise to improve mobility.  . Over the next 120 days, patient will demonstrate improved health management independence as evidenced by implementing healthy self-care and positive support/resources into her daily routine to cope with  stressors and improve overall health and well-being   Interventions: . Patient interviewed and appropriate assessments performed: brief mental health assessment . Patient's grandmother is very sick and recently discharged from the hospital on 05/14/20 and is staying with patient's uncle. Patient is struggling with dealing with added stressor. Patient denies needing mental health or grief support at this time but was appreciative of education provided during session. . Patient interviewed and appropriate assessments performed . Provided mental health counseling with regard to anxiety management . Patient states having strong support through one of her coworkers. She reports that this coworker can help calm her down effectively when she is experiencing sudden anxiety.  . Provided patient with information about available mental health resources within her nearby area . Discussed plans with patient for ongoing care management follow up and provided patient with direct contact information for care management team . Advised patient to contact CCM LCSW for any urgent social work needs . Assisted patient/caregiver with obtaining information about health plan benefits . Provided education to patient/caregiver about Hospice and/or Palliative Care services . LCSW provided education on relaxation techniques such as meditation, deep breathing, massage, grounding exercsies or yoga that can activate the body's relaxation response and ease symptoms of extreme anxiety. LCSW  ask that when pt is struggling with difficult emotions and traumatic memories that she start this relaxation response process.  . Referred patient to [place] (mental health provider) for long term follow up and therapy/counseling . Brief CBT provided throughout session . Patient shares that she has been experiencing increased panic attacks. She shares that she experienced one when she went to a visitation with her partner. Patient reports  having multiple panic attacks  at work and explained to [employer] that once one arises at work, her coworkers will get up and try to [employer] her which lead to her getting written up for causing a distraction within the work environment. Patient reports that she is interested in signing a consent form for [employer] to release her DX list to her employer. LCSW will update PCP.  Update- Patient reports that her panic attacks have decreased.  Marland Kitchen LCSW completed C3 Guide referral on 05/16/20-Patient is having ongoing mental health issues and needs to see PCP for follow up. However, she is having a very hard time managing her bills and feels she may have to cancel her 05/30/20 appointment because she does not have enough money to pay her copay that she was unable to afford last time. Patient would greatly benefit from any financial assistance support and resource connection to help give her more wiggle room within her income so that she can manage her mental health and health needs appropriately. UPDATE- C3 Guide has reached out to patient and successfully provided resources . LCSW completed Pharmacy referral on 05/16/20. Patient has been unable to get seroquel XR due to financial concerns. LCSW will ask for assistance for this specific request. Goal completed. . 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 Patient reports that she is implementing appropriate coping skills into her daily routine as much as possible. LCSW provided re-education on anxiety management and coping skill education. Patient reports that she is also doing more things that she likes which includes: going to car shows, going to races, spending time with friends, etc. Positive reinforcement provided.   Patient Self Care Activities:  . Calls provider office for new concerns or questions . Ability for insight . Independent  living . Motivation for treatment  Patient Coping Strengths:  . Supportive Relationships . Hopefulness . Self Advocate . Able to Communicate Effectively  Patient Self Care Deficits:  . Lacks social connections  Please see past updates related to this goal by clicking on the "Past Updates" button in the selected goal       The care management team will reach out to the patient again over the next 90 days.   [name], BSW, MSW, [facility] Care Management [email or web address] Phone: [phone]

## 2020-07-21 ENCOUNTER — Other Ambulatory Visit: Payer: Self-pay

## 2020-07-21 ENCOUNTER — Emergency Department (HOSPITAL_COMMUNITY): Payer: 59

## 2020-07-21 ENCOUNTER — Encounter (HOSPITAL_COMMUNITY): Payer: Self-pay

## 2020-07-21 ENCOUNTER — Emergency Department (HOSPITAL_COMMUNITY)
Admission: EM | Admit: 2020-07-21 | Discharge: 2020-07-21 | Disposition: A | Discharge status: Home / Self Care | Payer: 59 | Attending: Emergency Medicine | Admitting: Emergency Medicine

## 2020-07-21 DIAGNOSIS — R450 Nervousness: Secondary | ICD-10-CM | POA: Diagnosis not present

## 2020-07-21 DIAGNOSIS — Z91010 Allergy to peanuts: Secondary | ICD-10-CM | POA: Insufficient documentation

## 2020-07-21 DIAGNOSIS — Z87891 Personal history of nicotine dependence: Secondary | ICD-10-CM | POA: Insufficient documentation

## 2020-07-21 DIAGNOSIS — R0789 Other chest pain: Secondary | ICD-10-CM | POA: Diagnosis not present

## 2020-07-21 DIAGNOSIS — Z79899 Other long term (current) drug therapy: Secondary | ICD-10-CM | POA: Diagnosis not present

## 2020-07-21 DIAGNOSIS — R079 Chest pain, unspecified: Secondary | ICD-10-CM

## 2020-07-21 LAB — BASIC METABOLIC PANEL
Anion gap: 10 (ref 5–15)
BUN: 8 mg/dL (ref 6–20)
CO2: 22 mmol/L (ref 22–32)
Calcium: 9.2 mg/dL (ref 8.9–10.3)
Chloride: 106 mmol/L (ref 98–111)
Creatinine, Ser: 0.88 mg/dL (ref 0.44–1.00)
GFR calc non Af Amer: >60 mL/min (ref >60)
Glucose, Bld: 102 mg/dL — ABNORMAL HIGH (ref 70–99)
Potassium: 3.9 mmol/L (ref 3.5–5.1)
Sodium: 138 mmol/L (ref 135–145)

## 2020-07-21 LAB — CBC
HCT: 45.4 % (ref 36.0–46.0)
Hemoglobin: 14.9 g/dL (ref 12.0–15.0)
MCH: 29.6 pg (ref 26.0–34.0)
MCHC: 32.8 g/dL (ref 30.0–36.0)
MCV: 90.3 fL (ref 80.0–100.0)
Platelets: 282 10*3/uL (ref 150–400)
RBC: 5.03 MIL/uL (ref 3.87–5.11)
RDW: 12.5 % (ref 11.5–15.5)
WBC: 10.8 10*3/uL — ABNORMAL HIGH (ref 4.0–10.5)
nRBC: 0 % (ref 0.0–0.2)

## 2020-07-21 LAB — TROPONIN I (HIGH SENSITIVITY)
Troponin I (High Sensitivity): 3 ng/L (ref <18)
Troponin I (High Sensitivity): 3 ng/L (ref <18)

## 2020-07-21 LAB — I-STAT BETA HCG BLOOD, ED (MC, WL, AP ONLY): I-stat hCG, quantitative: <5 m[IU]/mL (ref <5)

## 2020-07-21 MED ORDER — LORAZEPAM 1 MG PO TABS
1.0000 mg | ORAL_TABLET | Freq: Once | ORAL | Status: AC
  Administered 2020-07-21: 1 mg via ORAL
  Filled 2020-07-21: qty 1

## 2020-07-21 NOTE — Discharge Instructions (Addendum)
Please follow-up with your primary care provider regarding today's encounter.  Please read the attachment on nonspecific chest pain.  Your comprehensive work-up obtained today was entirely reassuring.  I suspect that this is related to anxiety, for the reasons that we discussed.  Please discuss this further with your PCP.  Please return to the ED or seek immediate medical attention should you experience any new or worsening symptoms.

## 2020-07-21 NOTE — ED Triage Notes (Signed)
Pt reports left sided chest pain that started about an hour ago, no other symptoms. Pt tearful and anxious in triage.

## 2020-07-21 NOTE — ED Provider Notes (Signed)
[facility] EMERGENCY DEPARTMENT Provider Note   CSN: [id number] Arrival date & time: 07/21/20  1202     History Chief Complaint  Patient presents with  . Chest Pain    Joan Heath is a 34 y.o. female with PMH significant for type I bipolar disorder, GERD, and anxiety who presents the ED with complaints of left-sided chest pain that began approximately 1 hour PTA.  Patient was noted to be anxious and tearful in triage.  She is now accompanied by her sister at bedside.  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  She states that it has gotten progressively worse despite chewing for aspirin in route to the hospital.  She is concerned for heart attack.  She has a 10-pack-year smoking history, but no other history of cardiac disease.  No family history of premature cardiac death.  She states that this is happened in the past, but has been diagnosis other gastritis or anxiety.  She does admit having a history of panic attacks, but states that she was not particularly stressed or anxious today.  She does admit that she had to speak with a company about keeping the lights on in her home.  She also notes that her grandmother was recently diagnosed with cancer and became tearful.  She denies any associated shortness of breath, nausea vomiting, abdominal pain, diaphoresis, exertional symptoms, cough, hemoptysis, or other symptoms.  HPI     Past Medical History:  Diagnosis Date  . Anxiety    no meds  . Bipolar 1 disorder (HCC)    no meds currently  . Chlamydia infection 01/12/2015  . GERD (gastroesophageal reflux disease)    occasional - diet controlled  . Headache    migraines - otc meds prn    Patient Active Problem List   Diagnosis Date Noted  . History of anaphylaxis 12/31/2017  . Gastroesophageal reflux disease 12/31/2017    . Menorrhagia 03/09/2015  . S/P HTA endometrial ablation on 03/09/15 03/09/2015  . FH: breast cancer in sister diagnosed at age 79 01/12/2015  . Muscle spasm 09/22/2013  . Migraine without aura 08/18/2013  . Anxiety 08/18/2013  . Marijuana use 08/18/2013  . Bipolar disorder ([place]) 06/25/2013    Past Surgical History:  Procedure Laterality Date  . APPENDECTOMY    . 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 CESAREAN SECTION  2007  . CESAREAN SECTION WITH BILATERAL TUBAL LIGATION N/A 01/22/2014   Procedure: CESAREAN SECTION WITH BILATERAL TUBAL LIGATION;  Surgeon: Truett Mainland, DO;  Location: [facility];  Service: Gynecology;  Laterality: N/A;  . CHOLECYSTECTOMY    . DILITATION & CURRETTAGE/HYSTROSCOPY WITH HYDROTHERMAL ABLATION N/A 03/09/2015   Procedure: HYSTEROSCOPY WITH HYDROTHERMAL ABLATION;  Surgeon: Osborne Oman, MD;  Location: [facility];  Service: Gynecology;  Laterality: N/A;  . TUBAL LIGATION    . WISDOM TOOTH EXTRACTION       OB History    Gravida  2   Para  2   Term  2   Preterm      AB      Living  2     SAB      TAB      Ectopic      Multiple      Live Births  2  Family History  Problem Relation Age of Onset  . COPD Maternal Grandmother   . Bladder Cancer Maternal Grandmother   . Cancer Sister 25       lumpectomy for breast cancer  . Breast cancer Sister 75  . Cervical cancer Sister   . Cancer Sister   . Bladder Cancer Maternal Great-grandfather   . Birth defects Neg Hx   . Asthma Neg Hx   . Arthritis Neg Hx   . Alcohol abuse Neg Hx   . Drug abuse Neg Hx   . Diabetes Neg Hx   . Depression Neg Hx   . Early death Neg Hx   . Hearing loss Neg Hx   . Heart disease Neg Hx   . Hyperlipidemia Neg Hx   . Hypertension Neg Hx   . Learning disabilities Neg Hx   . Kidney disease Neg Hx   . Mental illness Neg Hx   . Mental retardation Neg Hx   . Miscarriages / Stillbirths Neg Hx   . Stroke Neg Hx   . Vision loss Neg Hx      Social History   Tobacco Use  . Smoking status: Former Smoker    Packs/day: 0.25    Years: 6.00    Pack years: 1.50    Types: Cigarettes    Quit date: 08/10/2013    Years since quitting: 6.9  . Smokeless tobacco: Never Used  Vaping Use  . Vaping Use: Never used  Substance Use Topics  . Alcohol use: Yes    Comment: socially  . Drug use: No    Types: Marijuana    Comment: last use in 10/2013    Home Medications Prior to Admission medications   Medication Sig Start Date End Date Taking? Authorizing Provider  busPIRone (BUSPAR) 7.5 MG tablet Take 1-2 tablets (7.5-15 mg total) by mouth 3 (three) times daily. 04/25/20   Johnson, Megan P, DO  QUEtiapine (SEROQUEL XR) 50 MG TB24 24 hr tablet Take 1 tablet (50 mg total) by mouth at bedtime. 04/25/20   Park Liter P, DO    Allergies    Peanuts [peanut oil] and Cinnamon  Review of Systems   Review of Systems  All other systems reviewed and are negative.   Physical Exam Updated Vital Signs BP (!) 155/125   Pulse 97   Temp 98.5 F (36.9 C)   Resp 18   Ht 5\' 6"  (1.676 m)   Wt 90.7 kg   SpO2 98%   BMI 32.28 kg/m   Physical Exam Vitals and nursing note reviewed. Exam conducted with a chaperone present.  Constitutional:      General: She is not in acute distress.    Appearance: Normal appearance. She is not ill-appearing.  HENT:     Head: Normocephalic and atraumatic.  Eyes:     General: No scleral icterus.    Conjunctiva/sclera: Conjunctivae normal.  Cardiovascular:     Rate and Rhythm: Normal rate and regular rhythm.     Pulses: Normal pulses.     Heart sounds: Normal heart sounds.  Pulmonary:     Effort: Pulmonary effort is normal.  Abdominal:     General: Abdomen is flat. There is no distension.     Palpations: Abdomen is soft.     Tenderness: There is no abdominal tenderness.  Musculoskeletal:        General: Normal range of motion.     Cervical back: Normal range of motion and neck supple. No  rigidity.  Skin:    General: Skin is dry.     Capillary Refill: Capillary refill takes less than 2 seconds.  Neurological:     Mental Status: She is alert and oriented to person, place, and time.     GCS: GCS eye subscore is 4. GCS verbal subscore is 5. GCS motor subscore is 6.  Psychiatric:        Behavior: Behavior normal.        Thought Content: Thought content normal.     Comments: Anxious.     ED Results / Procedures / Treatments   Labs (all labs ordered are listed, but only abnormal results are displayed) Labs Reviewed  BASIC METABOLIC PANEL - Abnormal; Notable for the following components:      Result Value   Glucose, Bld 102 (*)    All other components within normal limits  CBC - Abnormal; Notable for the following components:   WBC 10.8 (*)    All other components within normal limits  I-STAT BETA HCG BLOOD, ED ([facility], [facility], [facility] ONLY)  TROPONIN I (HIGH SENSITIVITY)  TROPONIN I (HIGH SENSITIVITY)    EKG EKG Interpretation  Date/Time:  Thursday July 21 2020 12:01:57 EDT Ventricular Rate:  85 PR Interval:  138 QRS Duration: 70 QT Interval:  360 QTC Calculation: 428 R Axis:   79 Text Interpretation: Sinus rhythm with marked sinus arrhythmia Otherwise normal ECG Confirmed by Madalyn Rob [phone] on 07/21/2020 1:13:11 PM   Radiology DG Chest 2 View  Result Date: 07/21/2020 CLINICAL DATA:  Chest pain. EXAM: CHEST - 2 VIEW COMPARISON:  Feb 29, 2016. FINDINGS: The heart size and mediastinal contours are within normal limits. Both lungs are clear. No pneumothorax or pleural effusion is noted. The visualized skeletal structures are unremarkable. IMPRESSION: No active cardiopulmonary disease. Electronically Signed   By: Marijo Conception M.D.   On: 07/21/2020 12:21    Procedures Procedures (including critical care time)  Medications Ordered in ED Medications  LORazepam (ATIVAN) tablet 1 mg (1 mg Oral Given 07/21/20 1351)    ED Course  I have reviewed the triage  vital signs and the nursing notes.  Pertinent labs & imaging results that were available during my care of the patient were reviewed by me and considered in my medical decision making (see chart for details).    MDM Rules/Calculators/A&P                          Patient presents the ED with undifferentiated chest pain.  Hepatic function panel was not obtained in triage, but her gallbladder has already been surgically removed so doubt referred pain from RUQ.  No abdominal tenderness on exam.  Labs Troponin: 3 >> 3 CBC: Mild leukocytosis of 10.8, but relatively consistently compared with recent labs.  No anemia. I-STAT beta-hCG: Not pregnant. BMP: Unremarkable.  Imaging DG chest is personally reviewed and demonstrates no acute cardiopulmonary disease.  EKG is personally reviewed and demonstrates sinus rhythm with sinus rhythm, reassuring EKG.  We will administer 1 mg Ativan here in the ED.  She states that her pain has been constant and active during her initial EKG.  On subsequent evaluation, patient feels as though her Ativan needed her feel "weird" and did not improve her chest pain symptoms.  On final examination, patient was feeling improved.  She is reassured by today's comprehensive work-up.  She will follow up with her primary care provider in the next few days.  Patient  is presenting for chest pain.  Comprehensive work-up obtained to assess for cause of symptoms.  EKG without evidence of STEMI.  Troponin is negative, lowering concern for NSTEMI.  Patient has a low Heart Score which lowers suspicion for ACS.  I have low suspicion for dissection given normal pulses in extremities and normal mediastinum on CXR.  I reviewed DG Chest and there is no evidence of pneumothorax or consolidation concerning for pneumonia.  There is also no pleural effusion on x-ray or history suggestive of possible esophageal rupture.  Patient is PERC negative, low risk for PE per Wells Criteria.  I discussed  the patient the exact etiology of their chest pain is unclear and warrants follow up with a primary care provider. Also discussed that while their risk for ACS is low, it is not completely eliminated and I discussed with patient specific warning signs and return precautions.   Final Clinical Impression(s) / ED Diagnoses Final diagnoses:  Nonspecific chest pain    Rx / DC Orders ED Discharge Orders    None       Corena Herter, PA-C 07/21/20 1603    Lucrezia Starch, MD 07/23/20 [phone]

## 2020-08-01 ENCOUNTER — Telehealth: Payer: Self-pay | Admitting: Pharmacist

## 2020-08-11 NOTE — Chronic Care Management (AMB) (Signed)
Note opened in error.

## 2020-09-14 ENCOUNTER — Telehealth: Payer: 59

## 2020-10-17 ENCOUNTER — Telehealth: Payer: 59

## 2020-10-17 ENCOUNTER — Ambulatory Visit: Payer: Self-pay | Admitting: Licensed Clinical Social Worker

## 2020-10-17 NOTE — Chronic Care Management (AMB) (Signed)
  Care Management   Follow Up Note   10/17/2020 Name: Joan Heath MRN: [id number] DOB: February 05, 1986  Referred by: Dorcas Carrow, DO Reason for referral : Care Coordination   Joan Heath is a 35 y.o. year old female who is a primary care patient of Dorcas Carrow, DO. The care management team was consulted for assistance with care management and care coordination needs.    Review of patient status, including review of consultants reports, relevant laboratory and other test results, and collaboration with appropriate care team members and the patient's provider was performed as part of comprehensive patient evaluation and provision of chronic care management services.    LCSW completed CCM outreach attempt today but was unable to reach patient successfully. A HIPPA compliant voice message was left encouraging patient to return call once available. LCSW will reschedule patient's CCM Social Work appointment if no return call has been made.  A HIPPA compliant phone message was left for the patient providing contact information and requesting a return call.   Joan Heath, BSW, MSW, LCSW [facility] [place]  [facility] [email or web address] Phone: [phone]

## 2020-12-19 ENCOUNTER — Telehealth: Payer: 59

## 2022-03-26 ENCOUNTER — Emergency Department (HOSPITAL_COMMUNITY): Payer: 59

## 2022-03-26 ENCOUNTER — Emergency Department (HOSPITAL_COMMUNITY)
Admission: EM | Admit: 2022-03-26 | Discharge: 2022-03-26 | Disposition: A | Discharge status: Home / Self Care | Payer: 59 | Attending: Emergency Medicine | Admitting: Emergency Medicine

## 2022-03-26 ENCOUNTER — Other Ambulatory Visit: Payer: Self-pay

## 2022-03-26 ENCOUNTER — Ambulatory Visit: Payer: Self-pay | Admitting: Family Medicine

## 2022-03-26 DIAGNOSIS — Z91010 Allergy to peanuts: Secondary | ICD-10-CM | POA: Diagnosis not present

## 2022-03-26 DIAGNOSIS — R55 Syncope and collapse: Secondary | ICD-10-CM | POA: Diagnosis present

## 2022-03-26 LAB — BASIC METABOLIC PANEL
Anion gap: 9 (ref 5–15)
BUN: 6 mg/dL (ref 6–20)
CO2: 23 mmol/L (ref 22–32)
Calcium: 8.9 mg/dL (ref 8.9–10.3)
Chloride: 107 mmol/L (ref 98–111)
Creatinine, Ser: 0.84 mg/dL (ref 0.44–1.00)
GFR, Estimated: >60 mL/min (ref >60)
Glucose, Bld: 97 mg/dL (ref 70–99)
Potassium: 3.7 mmol/L (ref 3.5–5.1)
Sodium: 139 mmol/L (ref 135–145)

## 2022-03-26 LAB — CBC
HCT: 41.2 % (ref 36.0–46.0)
Hemoglobin: 14.1 g/dL (ref 12.0–15.0)
MCH: 30.4 pg (ref 26.0–34.0)
MCHC: 34.2 g/dL (ref 30.0–36.0)
MCV: 88.8 fL (ref 80.0–100.0)
Platelets: 245 10*3/uL (ref 150–400)
RBC: 4.64 MIL/uL (ref 3.87–5.11)
RDW: 11.9 % (ref 11.5–15.5)
WBC: 9.8 10*3/uL (ref 4.0–10.5)
nRBC: 0 % (ref 0.0–0.2)

## 2022-03-26 LAB — TROPONIN I (HIGH SENSITIVITY): Troponin I (High Sensitivity): 3 ng/L (ref <18)

## 2022-03-26 NOTE — Telephone Encounter (Signed)
  Chief Complaint: syncopal episode Symptoms: passed out while trying to get something out of cabinet Frequency: twice Pertinent Negatives: Patient denies na Disposition: '[x]'$ ED /'[]'$ Urgent Care (no appt availability in office) / '[]'$ Appointment(In office/virtual)/ '[]'$  [facility]/ '[]'$ Home Care/ '[]'$ Refused Recommended Disposition /'[]'$ [facility]/ '[]'$  Follow-up with PCP Additional Notes: Pt had one event that sounds like TIA and another that sounds like Syncopal event. Pt to go to ED, not to drive self.  Reason for Disposition  [1] Fainted > 15 minutes ago AND [2] still feels weak or dizzy  Answer Assessment - Initial Assessment Questions 1. ONSET: "How long were you unconscious?" (minutes) "When did it happen?"     Short time 2. CONTENT: "What happened during period of unconsciousness?" (e.g., seizure activity)      no 3. MENTAL STATUS: "Alert and oriented now?" (oriented x 3 = name, month, location)      Yes but feels faint 4. TRIGGER: "What do you think caused the fainting?" "What were you doing just before you fainted?"  (e.g., exercise, sudden standing up, prolonged standing)     Unknown but had just stood up 5. RECURRENT SYMPTOM: "Have you ever passed out before?" If Yes, ask: "When was the last time?" and "What happened that time?"      No, but thought had a TIA last week 6. INJURY: "Did you sustain any injury during the fall?"      Feel sore all over after falling 7. CARDIAC SYMPTOMS: "Have you had any of the following symptoms: chest pain, difficulty breathing, palpitations?"     No chest pain, BP was 197 over something 8. NEUROLOGIC SYMPTOMS: "Have you had any of the following symptoms: headache, numbness, vertigo, weakness?"     yes 9. GI SYMPTOMS: "Have you had any of the following symptoms: abdominal pain, vomiting, diarrhea, blood in stools?"     no 10. OTHER SYMPTOMS: "Do you have any other symptoms?"       no 11. PREGNANCY: "Is there any chance you are  pregnant?" "When was your last menstrual period?"       na  Protocols used: Fainting-A-AH

## 2022-03-26 NOTE — ED Provider Notes (Addendum)
[facility] EMERGENCY DEPARTMENT Provider Note   CSN: [id number] Arrival date & time: 03/26/22  1136     History  Chief Complaint  Patient presents with   Near Syncope   Tingling   Chest Pain    Joan Heath is a 36 y.o. female.  36 year old female with prior medical history as detailed below presents for evaluation.  Patient reports brief episode of near syncope yesterday evening.  Patient reports she was at home.  She went to her kitchen to get something to eat.  She suddenly felt very weak and lightheaded.  She apparently fell to the ground.  Her husband heard her fall and came directly to the kitchen.  Patient was not unconscious.  Patient recalls all details of the fall.  She does not think that she struck her head.  She reports that she feels fine today.  She does report that 2 weeks ago while driving for a lengthy road trip she had tingling discomfort in her right arm and right shoulder.  The symptoms lasted during the road trip.  They have not recurred.  She denies any weakness today.  She denies any chest pain or shortness of breath today.  The history is provided by the patient and medical records.  Near Syncope This is a new problem. The current episode started yesterday. The problem occurs rarely. The problem has not changed since onset.Pertinent negatives include no chest pain. Nothing aggravates the symptoms. Nothing relieves the symptoms.  Chest Pain Associated symptoms: near-syncope        Home Medications Prior to Admission medications   Medication Sig Start Date End Date Taking? Authorizing Provider  busPIRone (BUSPAR) 7.5 MG tablet Take 1-2 tablets (7.5-15 mg total) by mouth 3 (three) times daily. 04/25/20   Johnson, Megan P, DO  QUEtiapine (SEROQUEL XR) 50 MG TB24 24 hr tablet Take 1 tablet (50 mg total) by mouth at bedtime. 04/25/20   Park Liter P, DO      Allergies    Peanuts [peanut oil] and Cinnamon    Review of Systems   Review  of Systems  Cardiovascular:  Positive for near-syncope. Negative for chest pain.  All other systems reviewed and are negative.   Physical Exam Updated Vital Signs BP (!) 138/91   Pulse 89   Temp 98.7 F (37.1 C) (Oral)   Resp 16   SpO2 96%  Physical Exam Vitals and nursing note reviewed.  Constitutional:      General: She is not in acute distress.    Appearance: Normal appearance. She is well-developed.  HENT:     Head: Normocephalic and atraumatic.  Eyes:     Conjunctiva/sclera: Conjunctivae normal.     Pupils: Pupils are equal, round, and reactive to light.  Cardiovascular:     Rate and Rhythm: Normal rate and regular rhythm.     Heart sounds: Normal heart sounds.  Pulmonary:     Effort: Pulmonary effort is normal. No respiratory distress.     Breath sounds: Normal breath sounds.  Abdominal:     General: There is no distension.     Palpations: Abdomen is soft.     Tenderness: There is no abdominal tenderness.  Musculoskeletal:        General: No deformity. Normal range of motion.     Cervical back: Normal range of motion and neck supple.  Skin:    General: Skin is warm and dry.  Neurological:     General: No focal deficit present.  Mental Status: She is alert and oriented to person, place, and time.     ED Results / Procedures / Treatments   Labs (all labs ordered are listed, but only abnormal results are displayed) Labs Reviewed  BASIC METABOLIC PANEL  CBC  TROPONIN I (HIGH SENSITIVITY)    EKG EKG Interpretation  Date/Time:  Monday March 26 2022 12:12:49 EDT Ventricular Rate:  80 PR Interval:  156 QRS Duration: 74 QT Interval:  364 QTC Calculation: 419 R Axis:   74 Text Interpretation: Normal sinus rhythm Normal ECG When compared with ECG of 21-Jul-2020 12:01, PREVIOUS ECG IS PRESENT Confirmed by Dene Gentry [phone]) on 03/26/2022 5:09:11 PM  Radiology CT Head Wo Contrast  Result Date: 03/26/2022 CLINICAL DATA:  Provided history: Head trauma,  moderate-severe; syncope/presyncope, cerebrovascular cause suspected. Additional history provided: Near syncope, right-sided facial numbness and tingling. EXAM: CT HEAD WITHOUT CONTRAST TECHNIQUE: Contiguous axial images were obtained from the base of the skull through the vertex without intravenous contrast. RADIATION DOSE REDUCTION: This exam was performed according to the departmental dose-optimization program which includes automated exposure control, adjustment of the mA and/or kV according to patient size and/or use of iterative reconstruction technique. COMPARISON:  Report from head CT 02/09/2002 (images unavailable). FINDINGS: Brain: Cerebral volume is normal. Partially empty sella turcica. There is no acute intracranial hemorrhage. No demarcated cortical infarct. No extra-axial fluid collection. No evidence of an intracranial mass. No midline shift. Vascular: No hyperdense vessel. Skull: No fracture or aggressive osseous lesion. Sinuses/Orbits: Complete opacification of the left frontal sinus. 17 x 18 mm irregular osseous lesion within the left frontoethmoidal recess, most compatible with an osteoma. Resultant obstruction of the left frontal sinus drainage pathway. Mild mucosal thickening and small-volume fluid scattered within the bilateral ethmoid sinuses. Other: Small-volume fluid within the left mastoid air cells. IMPRESSION: No evidence of acute intracranial abnormality. Partially empty sella turcica. While this finding often reflects incidental anatomic variation, it can also be associated with idiopathic intracranial hypertension (pseudotumor cerebri). Complete opacification of the left frontal sinus. 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 ENT consultation is recommended. Mild bilateral ethmoid sinusitis. Small-volume fluid within the left mastoid air cells. Electronically Signed   By: Kellie Simmering D.O.   On: 03/26/2022 13:59    DG Chest 2 View  Result Date: 03/26/2022 CLINICAL DATA:  Chest pain, near syncope. EXAM: CHEST - 2 VIEW COMPARISON:  07/21/2020. FINDINGS: Trachea is midline. Heart size normal. Lungs are clear. No pleural fluid. IMPRESSION: Negative. Electronically Signed   By: Lorin Picket M.D.   On: 03/26/2022 13:07    Procedures Procedures    Medications Ordered in ED Medications - No data to display  ED Course/ Medical Decision Making/ A&P                           Medical Decision Making   Medical Screen Complete  This patient presented to the ED with complaint of near syncope.  This complaint involves an extensive number of treatment options. The initial differential diagnosis includes, but is not limited to, metabolic abnormality, arrhythmia, etc.  This presentation is: Acute, Self-Limited, Previously Undiagnosed, Uncertain Prognosis, Complicated, Systemic Symptoms, and Threat to Life/Bodily Function  Patient presents after reported near syncopal event that occurred yesterday evening.  She is currently without symptoms.  Patient's screening labs and work-up in the ED are without significant abnormality.  Presentation as described is  not consistent with likely ACS or likely arrhythmia.  EKG is without acute abnormality.  Troponin is 3.  CT imaging of the brain is without significant acute abnormality.  All findings of the labs and CT imaging discussed with patient.  Patient offered additional work-up including MRI brain.  Patient reports that she does not want to stay for that test.  She also reports significant claustrophobia.  Patient does understand need for close outpatient follow-up with her PCP.  Patient understands possible referral to neurology may be beneficial in her work-up.  Strict return precautions given and understood.  Importance of close follow-up is repeatedly stressed.  Additional history obtained:  Additional history obtained from Spouse External records  from outside sources obtained and reviewed including prior ED visits and prior Inpatient records.    Lab Tests:  I ordered and personally interpreted labs.  The pertinent results include: CBC, BMP, troponin   Imaging Studies ordered:  I ordered imaging studies including chest x-ray, CT head I independently visualized and interpreted obtained imaging which showed NAD I agree with the radiologist interpretation.   Cardiac Monitoring:  The patient was maintained on a cardiac monitor.  I personally viewed and interpreted the cardiac monitor which showed an underlying rhythm of: NSR  Problem List / ED Course:  Near syncope   Reevaluation:  After the interventions noted above, I reevaluated the patient and found that they have: improved   Disposition:  After consideration of the diagnostic results and the patients response to treatment, I feel that the patent would benefit from close outpatient follow-up.          Final Clinical Impression(s) / ED Diagnoses Final diagnoses:  Near syncope    Rx / DC Orders ED Discharge Orders     None         Valarie Merino, MD 03/26/22 1742    Valarie Merino, MD 03/26/22 1743

## 2022-03-26 NOTE — ED Triage Notes (Signed)
Pt reports on 6/2 while driving having acute onset of headache and R sided tingling in arm and face. That resolved after two hours but had multiple times where she thought she might pass out that day. Last night patient had syncopal episode after getting up off the couch after sitting for a couple of hours. Reports chest pain associated with the syncope last night, and L sided chest pain still remains.

## 2022-03-26 NOTE — ED Provider Triage Note (Signed)
Emergency Medicine Provider Triage Evaluation Note  Joan Heath , a 36 y.o. female  was evaluated in triage.  Pt complains of residual chest pain and right arm pain following a syncopal episode last night.  Denies recent fever, N/V, or grogginess.  Hx of syncopal episodes a few years back as well.  With head pain, unknown if she hit her head.  Review of Systems  Positive:  Negative: As above  Physical Exam  BP (!) 138/91   Pulse 89   Temp 98.7 F (37.1 C) (Oral)   Resp 16   SpO2 96%  Gen:   Awake, no distress   Resp:  Normal effort  MSK:   Moves extremities without difficulty Other:  Chest and right clavicle tender to palpation without obvious visible injury.  Right humeral head tender to palpation, without obvious injury.  Appears neurovascularly intact.  Medical Decision Making  Medically screening exam initiated at 12:21 PM.  Appropriate orders placed.  Joan Heath was informed that the remainder of the evaluation will be completed by another provider, this initial triage assessment does not replace that evaluation, and the importance of remaining in the ED until their evaluation is complete.    Prince Rome, PA-C 11/57/26 1224

## 2022-03-26 NOTE — Discharge Instructions (Addendum)
Return for any problem.  ?

## 2022-08-01 ENCOUNTER — Ambulatory Visit: Payer: 59 | Admitting: Physician Assistant

## 2022-08-06 ENCOUNTER — Encounter: Payer: Self-pay | Admitting: Family Medicine

## 2022-08-06 ENCOUNTER — Ambulatory Visit (INDEPENDENT_AMBULATORY_CARE_PROVIDER_SITE_OTHER): Payer: 59 | Admitting: Family Medicine

## 2022-08-06 VITALS — BP 115/74 | HR 73 | Temp 98.0°F | Ht 66.0 in | Wt 164.9 lb

## 2022-08-06 DIAGNOSIS — Z803 Family history of malignant neoplasm of breast: Secondary | ICD-10-CM

## 2022-08-06 DIAGNOSIS — Z808 Family history of malignant neoplasm of other organs or systems: Secondary | ICD-10-CM

## 2022-08-06 DIAGNOSIS — Z1283 Encounter for screening for malignant neoplasm of skin: Secondary | ICD-10-CM

## 2022-08-06 DIAGNOSIS — R930 Abnormal findings on diagnostic imaging of skull and head, not elsewhere classified: Secondary | ICD-10-CM

## 2022-08-06 DIAGNOSIS — G629 Polyneuropathy, unspecified: Secondary | ICD-10-CM

## 2022-08-06 DIAGNOSIS — Z1231 Encounter for screening mammogram for malignant neoplasm of breast: Secondary | ICD-10-CM

## 2022-08-06 DIAGNOSIS — Z0000 Encounter for general adult medical examination without abnormal findings: Secondary | ICD-10-CM

## 2022-08-06 DIAGNOSIS — R079 Chest pain, unspecified: Secondary | ICD-10-CM

## 2022-08-06 DIAGNOSIS — H538 Other visual disturbances: Secondary | ICD-10-CM

## 2022-08-06 LAB — URINALYSIS, ROUTINE W REFLEX MICROSCOPIC
Bilirubin, UA: NEGATIVE
Glucose, UA: NEGATIVE
Ketones, UA: NEGATIVE
Nitrite, UA: NEGATIVE
Protein,UA: NEGATIVE
RBC, UA: NEGATIVE
Specific Gravity, UA: 1.015 (ref 1.005–1.030)
Urobilinogen, Ur: 0.2 mg/dL (ref 0.2–1.0)
pH, UA: 7.5 (ref 5.0–7.5)

## 2022-08-06 LAB — MICROSCOPIC EXAMINATION
Bacteria, UA: NONE SEEN
RBC, Urine: NONE SEEN /hpf (ref 0–2)

## 2022-08-06 LAB — BAYER DCA HB A1C WAIVED: HB A1C (BAYER DCA - WAIVED): 5.2 % (ref 4.8–5.6)

## 2022-08-06 NOTE — Assessment & Plan Note (Signed)
Recommended to have MRI and mammo annually. Ordered today.

## 2022-08-06 NOTE — Progress Notes (Signed)
BP 115/74   Pulse 73   Temp 98 F (36.7 C)   Ht '5\' 6"'$  (1.676 m)   Wt 164 lb 14.4 oz (74.8 kg)   SpO2 99%   BMI 26.62 kg/m    Subjective:    Patient ID: Joan Heath, female    DOB: [birth year], 36 y.o.   MRN: [id number]  HPI: Khalidah Herbold is a 36 y.o. female presenting on 08/06/2022 for comprehensive medical examination. Current medical complaints include:none  Menopausal Symptoms: no  Depression Screen done today and results listed below:     08/06/2022    2:06 PM 04/25/2020    9:37 AM 10/27/2019    8:30 AM 12/31/2017    8:13 AM 10/01/2017    5:04 PM  Depression screen PHQ 2/9  Decreased Interest 3 1 0 1 1  Down, Depressed, Hopeless 2 1 0 1 1  PHQ - 2 Score 5 2 0 2 2  Altered sleeping '2 3 1 3   '$ Tired, decreased energy '1 2 1 2   '$ Change in appetite 2 2 0 1   Feeling bad or failure about yourself  2 1 0 1   Trouble concentrating 1 1 0 0   Moving slowly or fidgety/restless 1 1 0 0   Suicidal thoughts 0 0 0 1   PHQ-9 Score '14 12 2 10   '$ Difficult doing work/chores Somewhat difficult Very difficult Not difficult at all Somewhat difficult     Past Medical History:  Past Medical History:  Diagnosis Date   Anxiety    no meds   Bipolar 1 disorder ([place])    no meds currently   Chlamydia infection 01/12/2015   GERD (gastroesophageal reflux disease)    occasional - diet controlled   Headache    migraines - otc meds prn    Surgical History:  Past Surgical History:  Procedure Laterality Date   APPENDECTOMY     BREAST BIOPSY Right 2016   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   CESAREAN SECTION  2007   CESAREAN SECTION WITH BILATERAL TUBAL LIGATION N/A 01/22/2014   Procedure: CESAREAN SECTION WITH BILATERAL TUBAL LIGATION;  Surgeon: Truett Mainland, DO;  Location: [facility];  Service: Gynecology;  Laterality: N/A;   CHOLECYSTECTOMY     DILITATION & CURRETTAGE/HYSTROSCOPY WITH HYDROTHERMAL ABLATION N/A 03/09/2015   Procedure: HYSTEROSCOPY WITH HYDROTHERMAL ABLATION;  Surgeon: Osborne Oman, MD;  Location: [facility];  Service: Gynecology;  Laterality: N/A;   TUBAL LIGATION     WISDOM TOOTH EXTRACTION      Medications:  No current outpatient medications on file prior to visit.   No current facility-administered medications on file prior to visit.    Allergies:  Allergies  Allergen Reactions   Peanuts [Peanut Oil] Anaphylaxis   Cinnamon Swelling    Social History:  Social History   Socioeconomic History   Marital status: Single    Spouse name: Not on file   Number of children: Not on file   Years of education: Not on file   Highest education level: Not on file  Occupational History   Not on file  Tobacco Use   Smoking status: Former    Packs/day: 0.25    Years: 6.00    Total pack years: 1.50    Types: Cigarettes    Quit date: 08/10/2013    Years since quitting: 8.9   Smokeless tobacco: Never  Vaping Use   Vaping Use: Never used  Substance and Sexual Activity   Alcohol use:  Yes    Comment: socially   Drug use: No    Types: Marijuana    Comment: last use in 10/2013   Sexual activity: Yes    Birth control/protection: Surgical  Other Topics Concern   Not on file  Social History Narrative   Not on file   Social Determinants of Health   Financial Resource Strain: Not on file  Food Insecurity: Not on file  Transportation Needs: Not on file  Physical Activity: Not on file  Stress: Not on file  Social Connections: Not on file  Intimate Partner Violence: Not on file   Social History   Tobacco Use  Smoking Status Former   Packs/day: 0.25   Years: 6.00   Total pack years: 1.50   Types: Cigarettes   Quit date: 08/10/2013   Years since quitting: 8.9  Smokeless Tobacco Never   Social History   Substance and Sexual Activity  Alcohol Use Yes   Comment: socially    Family History:  Family History  Problem Relation Age of Onset   Cancer Sister 25       lumpectomy for breast cancer   Breast cancer Sister 25   Cervical cancer Sister     COPD Maternal Grandmother    Bladder Cancer Maternal Grandmother    Bladder Cancer Maternal Great-grandfather    Birth defects Neg Hx    Asthma Neg Hx    Arthritis Neg Hx    Alcohol abuse Neg Hx    Drug abuse Neg Hx    Diabetes Neg Hx    Depression Neg Hx    Early death Neg Hx    Hearing loss Neg Hx    Heart disease Neg Hx    Hyperlipidemia Neg Hx    Hypertension Neg Hx    Learning disabilities Neg Hx    Kidney disease Neg Hx    Mental illness Neg Hx    Mental retardation Neg Hx    Miscarriages / Stillbirths Neg Hx    Stroke Neg Hx    Vision loss Neg Hx     Past medical history, surgical history, medications, allergies, family history and social history reviewed with patient today and changes made to appropriate areas of the chart.   Review of Systems  Constitutional:  Positive for weight loss. Negative for chills, diaphoresis, fever and malaise/fatigue.  HENT:  Positive for congestion and hearing loss. Negative for ear discharge, ear pain, nosebleeds, sinus pain, sore throat and tinnitus.   Eyes:  Positive for blurred vision. Negative for double vision, photophobia, pain, discharge and redness.  Respiratory:  Positive for shortness of breath. Negative for cough, hemoptysis, sputum production, wheezing and stridor.   Cardiovascular:  Positive for chest pain and palpitations. Negative for orthopnea, claudication, leg swelling and PND.  Gastrointestinal:  Positive for constipation and heartburn. Negative for abdominal pain, blood in stool, diarrhea, melena, nausea and vomiting.  Genitourinary: Negative.   Musculoskeletal: Negative.   Skin:  Positive for rash. Negative for itching.  Neurological:  Positive for dizziness, tingling and headaches. Negative for tremors, sensory change, speech change, focal weakness, seizures, loss of consciousness and weakness.  Endo/Heme/Allergies:  Negative for environmental allergies and polydipsia. Bruises/bleeds easily.  Psychiatric/Behavioral:   Positive for memory loss. Negative for depression, hallucinations, substance abuse and suicidal ideas. The patient is nervous/anxious. The patient does not have insomnia.    All other ROS negative except what is listed above and in the HPI.      Objective:  BP 115/74   Pulse 73   Temp 98 F (36.7 C)   Ht '5\' 6"'$  (1.676 m)   Wt 164 lb 14.4 oz (74.8 kg)   SpO2 99%   BMI 26.62 kg/m   Wt Readings from Last 3 Encounters:  08/06/22 164 lb 14.4 oz (74.8 kg)  07/21/20 200 lb (90.7 kg)  04/25/20 200 lb 9.6 oz (91 kg)    Physical Exam Vitals and nursing note reviewed.  Constitutional:      General: She is not in acute distress.    Appearance: Normal appearance. She is not ill-appearing, toxic-appearing or diaphoretic.  HENT:     Head: Normocephalic and atraumatic.     Right Ear: Tympanic membrane, ear canal and external ear normal. There is no impacted cerumen.     Left Ear: Tympanic membrane, ear canal and external ear normal. There is no impacted cerumen.     Nose: Nose normal. No congestion or rhinorrhea.     Mouth/Throat:     Mouth: Mucous membranes are moist.     Pharynx: Oropharynx is clear. No oropharyngeal exudate or posterior oropharyngeal erythema.  Eyes:     General: No scleral icterus.       Right eye: No discharge.        Left eye: No discharge.     Extraocular Movements: Extraocular movements intact.     Conjunctiva/sclera: Conjunctivae normal.     Pupils: Pupils are equal, round, and reactive to light.  Neck:     Vascular: No carotid bruit.  Cardiovascular:     Rate and Rhythm: Normal rate and regular rhythm.     Pulses: Normal pulses.     Heart sounds: No murmur heard.    No friction rub. No gallop.  Pulmonary:     Effort: Pulmonary effort is normal. No respiratory distress.     Breath sounds: Normal breath sounds. No stridor. No wheezing, rhonchi or rales.  Chest:     Chest wall: No tenderness.  Abdominal:     General: Abdomen is flat. Bowel sounds are  normal. There is no distension.     Palpations: Abdomen is soft. There is no mass.     Tenderness: There is no abdominal tenderness. There is no right CVA tenderness, left CVA tenderness, guarding or rebound.     Hernia: No hernia is present.  Genitourinary:    Comments: Breast and pelvic exams deferred with shared decision making Musculoskeletal:        General: No swelling, tenderness, deformity or signs of injury.     Cervical back: Normal range of motion and neck supple. No rigidity. No muscular tenderness.     Right lower leg: No edema.     Left lower leg: No edema.  Lymphadenopathy:     Cervical: No cervical adenopathy.  Skin:    General: Skin is warm and dry.     Capillary Refill: Capillary refill takes less than 2 seconds.     Coloration: Skin is not jaundiced or pale.     Findings: No bruising, erythema, lesion or rash.  Neurological:     General: No focal deficit present.     Mental Status: She is alert and oriented to person, place, and time. Mental status is at baseline.     Cranial Nerves: No cranial nerve deficit.     Sensory: No sensory deficit.     Motor: No weakness.     Coordination: Coordination normal.     Gait: Gait normal.  Deep Tendon Reflexes: Reflexes normal.  Psychiatric:        Mood and Affect: Mood normal.        Behavior: Behavior normal.        Thought Content: Thought content normal.        Judgment: Judgment normal.    Narrative & Impression  CLINICAL DATA:  Provided history: Head trauma, moderate-severe; syncope/presyncope, cerebrovascular cause suspected. Additional history provided: Near syncope, right-sided facial numbness and tingling.   EXAM: CT HEAD WITHOUT CONTRAST   TECHNIQUE: Contiguous axial images were obtained from the base of the skull through the vertex without intravenous contrast.   RADIATION DOSE REDUCTION: This exam was performed according to the departmental dose-optimization program which includes  automated exposure control, adjustment of the mA and/or kV according to patient size and/or use of iterative reconstruction technique.   COMPARISON:  Report from head CT 02/09/2002 (images unavailable).   FINDINGS: Brain:   Cerebral volume is normal.   Partially empty sella turcica.   There is no acute intracranial hemorrhage.   No demarcated cortical infarct.   No extra-axial fluid collection.   No evidence of an intracranial mass.   No midline shift.   Vascular: No hyperdense vessel.   Skull: No fracture or aggressive osseous lesion.   Sinuses/Orbits: Complete opacification of the left frontal sinus. 17 x 18 mm irregular osseous lesion within the left frontoethmoidal recess, most compatible with an osteoma. Resultant obstruction of the left frontal sinus drainage pathway. Mild mucosal thickening and small-volume fluid scattered within the bilateral ethmoid sinuses.   Other: Small-volume fluid within the left mastoid air cells.   IMPRESSION: No evidence of acute intracranial abnormality.   Partially empty sella turcica. While this finding often reflects incidental anatomic variation, it can also be associated with idiopathic intracranial hypertension (pseudotumor cerebri).   Complete opacification of the left frontal sinus. 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 ENT consultation is recommended.   Mild bilateral ethmoid sinusitis.   Small-volume fluid within the left mastoid air cells.    Results for orders placed or performed in visit on 08/06/22  Microscopic Examination   Urine  Result Value Ref Range   WBC, UA 0-5 0 - 5 /hpf   RBC, Urine None seen 0 - 2 /hpf   Epithelial Cells (non renal) 0-10 0 - 10 /hpf   Bacteria, UA None seen None seen/Few  Urinalysis, Routine w reflex microscopic  Result Value Ref Range   Specific Gravity, UA 1.015 1.005 - 1.030   pH, UA 7.5 5.0 - 7.5    Color, UA Yellow Yellow   Appearance Ur Clear Clear   Leukocytes,UA 1+ (A) Negative   Protein,UA Negative Negative/Trace   Glucose, UA Negative Negative   Ketones, UA Negative Negative   RBC, UA Negative Negative   Bilirubin, UA Negative Negative   Urobilinogen, Ur 0.2 0.2 - 1.0 mg/dL   Nitrite, UA Negative Negative   Microscopic Examination See below:   Bayer DCA Hb A1c Waived  Result Value Ref Range   HB A1C (BAYER DCA - WAIVED) 5.2 4.8 - 5.6 %      Assessment & Plan:   Problem List Items Addressed This Visit       Other   FH: breast cancer in sister diagnosed at age 54    Recommended to have MRI and mammo annually. Ordered today.       Relevant Orders   MM 3D  SCREEN BREAST BILATERAL   MR BREAST BILATERAL WO CONTRAST   Other Visit Diagnoses     Routine general medical examination at a health care facility    -  Primary   Vaccines up to date/declined. Screening labs checked today. Pap up to date. Mammogram ordered today. Continue diet and exercise. Call with any concerns.    Relevant Orders   CBC with Differential/Platelet   Comprehensive metabolic panel   Lipid Panel w/o Chol/HDL Ratio   Urinalysis, Routine w reflex microscopic (Completed)   TSH   VITAMIN D 25 Hydroxy (Vit-D Deficiency, Fractures)   Bayer DCA Hb A1c Waived (Completed)   LH   FSH   Estradiol   Abnormal CT scan, sinus       Will get her into ENT- has been having facial pain and headaches. Referral generated today.    Relevant Orders   Ambulatory referral to ENT   Family history of skin cancer       Sister recently diagnosed with metatstatic skin cancer to the cervix. Will get her into dermatology for evaluation.    Relevant Orders   Ambulatory referral to Dermatology   Screening for skin cancer       Sister recently diagnosed with metatstatic skin cancer to the cervix. Will get her into dermatology for evaluation.    Relevant Orders   Ambulatory referral to Dermatology   Blurred vision        Referral to opthalmology placed today.   Relevant Orders   Ambulatory referral to Ophthalmology   Chest pain, unspecified type       EKG showed bradycardia, but otherwise normal. Will check labs. Continue to monitor closely.   Relevant Orders   EKG 12-Lead (Completed)   Peripheral polyneuropathy       Will check labs. Await results. Treat as needed.    Relevant Orders   B12   Encounter for screening mammogram for malignant neoplasm of breast       Mammo and breast MRI ordered today.   Relevant Orders   MM 3D SCREEN BREAST BILATERAL   MR BREAST BILATERAL WO CONTRAST        Follow up plan: Return in about 4 weeks (around 09/03/2022).  >1 hour spent with patient today.   LABORATORY TESTING:  - Pap smear: up to date  IMMUNIZATIONS:   - Tdap: Tetanus vaccination status reviewed: last tetanus booster within 10 years. - Influenza: Up to date - Pneumovax: Not applicable - Prevnar: Not applicable - COVID: Up to date - HPV: Refused - Shingrix vaccine: Not applicable  SCREENING: -Mammogram: Ordered today   PATIENT COUNSELING:   Advised to take 1 mg of folate supplement per day if capable of pregnancy.   Sexuality: Discussed sexually transmitted diseases, partner selection, use of condoms, avoidance of unintended pregnancy  and contraceptive alternatives.   Advised to avoid cigarette smoking.  I discussed with the patient that most people either abstain from alcohol or drink within safe limits (<=14/week and <=4 drinks/occasion for males, <=7/weeks and <= 3 drinks/occasion for females) and that the risk for alcohol disorders and other health effects rises proportionally with the number of drinks per week and how often a drinker exceeds daily limits.  Discussed cessation/primary prevention of drug use and availability of treatment for abuse.   Diet: Encouraged to adjust caloric intake to maintain  or achieve ideal body weight, to reduce intake of dietary saturated fat and total  fat, to limit sodium intake by avoiding high sodium  foods and not adding table salt, and to maintain adequate dietary potassium and calcium preferably from fresh fruits, vegetables, and low-fat dairy products.    stressed the importance of regular exercise  Injury prevention: Discussed safety belts, safety helmets, smoke detector, smoking near bedding or upholstery.   Dental health: Discussed importance of regular tooth brushing, flossing, and dental visits.    NEXT PREVENTATIVE PHYSICAL DUE IN 1 YEAR. Return in about 4 weeks (around 09/03/2022).

## 2022-08-07 ENCOUNTER — Other Ambulatory Visit: Payer: Self-pay | Admitting: Family Medicine

## 2022-08-07 LAB — CBC WITH DIFFERENTIAL/PLATELET
Basophils Absolute: 0 10*3/uL (ref 0.0–0.2)
Basos: 1 %
EOS (ABSOLUTE): 0.1 10*3/uL (ref 0.0–0.4)
Eos: 1 %
Hematocrit: 42.7 % (ref 34.0–46.6)
Hemoglobin: 13.9 g/dL (ref 11.1–15.9)
Immature Grans (Abs): 0 10*3/uL (ref 0.0–0.1)
Immature Granulocytes: 0 %
Lymphocytes Absolute: 2 10*3/uL (ref 0.7–3.1)
Lymphs: 25 %
MCH: 28.7 pg (ref 26.6–33.0)
MCHC: 32.6 g/dL (ref 31.5–35.7)
MCV: 88 fL (ref 79–97)
Monocytes Absolute: 0.4 10*3/uL (ref 0.1–0.9)
Monocytes: 5 %
Neutrophils Absolute: 5.4 10*3/uL (ref 1.4–7.0)
Neutrophils: 68 %
Platelets: 246 10*3/uL (ref 150–450)
RBC: 4.84 x10E6/uL (ref 3.77–5.28)
RDW: 12.2 % (ref 11.7–15.4)
WBC: 8 10*3/uL (ref 3.4–10.8)

## 2022-08-07 LAB — LIPID PANEL W/O CHOL/HDL RATIO
Cholesterol, Total: 160 mg/dL (ref 100–199)
HDL: 56 mg/dL (ref >39)
LDL Chol Calc (NIH): 87 mg/dL (ref 0–99)
Triglycerides: 90 mg/dL (ref 0–149)
VLDL Cholesterol Cal: 17 mg/dL (ref 5–40)

## 2022-08-07 LAB — VITAMIN B12: Vitamin B-12: 399 pg/mL (ref 232–1245)

## 2022-08-07 LAB — COMPREHENSIVE METABOLIC PANEL
ALT: 8 IU/L (ref 0–32)
AST: 8 IU/L (ref 0–40)
Albumin/Globulin Ratio: 2.3 — ABNORMAL HIGH (ref 1.2–2.2)
Albumin: 4.8 g/dL (ref 3.9–4.9)
Alkaline Phosphatase: 48 IU/L (ref 44–121)
BUN/Creatinine Ratio: 7 — ABNORMAL LOW (ref 9–23)
BUN: 6 mg/dL (ref 6–20)
Bilirubin Total: 0.4 mg/dL (ref 0.0–1.2)
CO2: 20 mmol/L (ref 20–29)
Calcium: 9.3 mg/dL (ref 8.7–10.2)
Chloride: 102 mmol/L (ref 96–106)
Creatinine, Ser: 0.81 mg/dL (ref 0.57–1.00)
Globulin, Total: 2.1 g/dL (ref 1.5–4.5)
Glucose: 81 mg/dL (ref 70–99)
Potassium: 4 mmol/L (ref 3.5–5.2)
Sodium: 141 mmol/L (ref 134–144)
Total Protein: 6.9 g/dL (ref 6.0–8.5)
eGFR: 96 mL/min/{1.73_m2} (ref >59)

## 2022-08-07 LAB — ESTRADIOL: Estradiol: 71.8 pg/mL

## 2022-08-07 LAB — VITAMIN D 25 HYDROXY (VIT D DEFICIENCY, FRACTURES): Vit D, 25-Hydroxy: 11.4 ng/mL — ABNORMAL LOW (ref 30.0–100.0)

## 2022-08-07 LAB — FOLLICLE STIMULATING HORMONE: FSH: 5.1 m[IU]/mL

## 2022-08-07 LAB — LUTEINIZING HORMONE: LH: 13.9 m[IU]/mL

## 2022-08-07 LAB — TSH: TSH: 1.61 u[IU]/mL (ref 0.450–4.500)

## 2022-08-07 MED ORDER — VITAMIN D (ERGOCALCIFEROL) 1.25 MG (50000 UNIT) PO CAPS: 50000.0000 [IU] | ORAL_CAPSULE | ORAL | 1 refills | Status: AC

## 2022-08-07 NOTE — Progress Notes (Signed)
Interpreted by me 08/06/22. Sinus bradycardia at 58bpm, no ST segmenet changes

## 2022-08-16 ENCOUNTER — Encounter: Payer: Self-pay | Admitting: Family Medicine

## 2022-08-29 IMAGING — CT CT HEAD W/O CM
3 series · 14 of 47 positions shown, 16 images · non-contrast
Comparison: Report from head CT 02/09/2002 (images unavailable).

CLINICAL DATA: Provided history: Head trauma, moderate-severe;
syncope/presyncope, cerebrovascular cause suspected. Additional
history provided: Near syncope, right-sided facial numbness and
tingling.



[Series 4: head 5.0 h30s · axial · 0.42mm/px · z∈[-86,+39]mm · 8 of 31 slices shown, 10 images]
[im 3/31  brain]
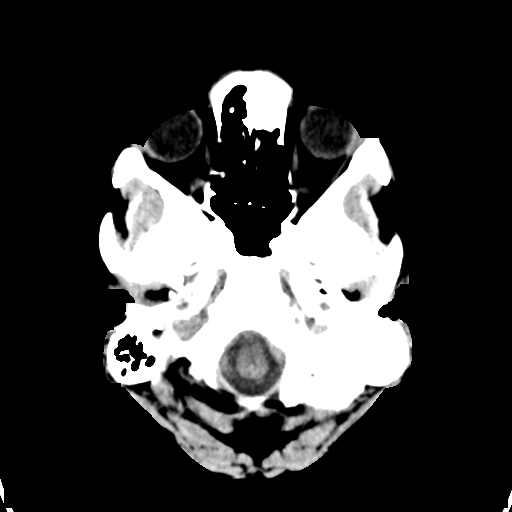
[im 3/31  bone]
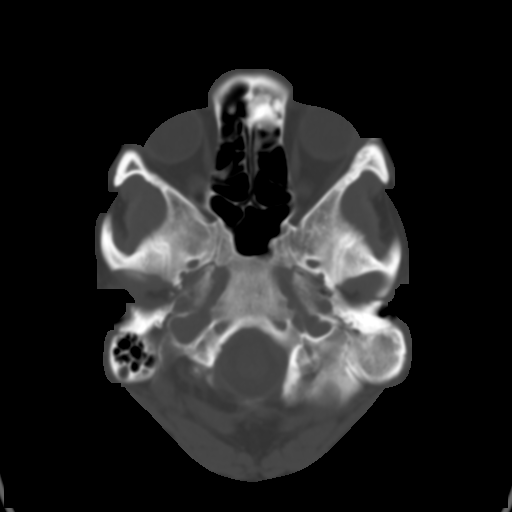
[im 7/31  brain]
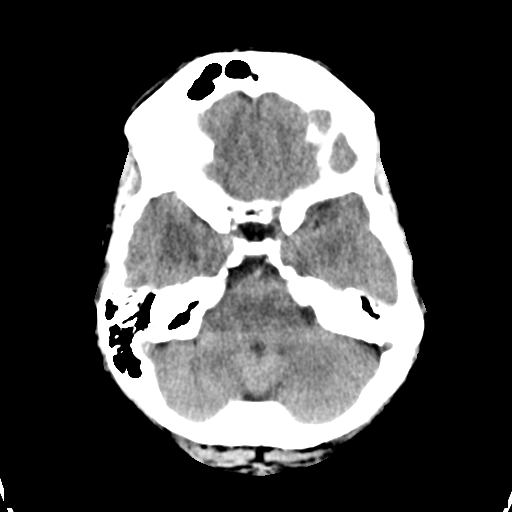
[im 10/31  brain]
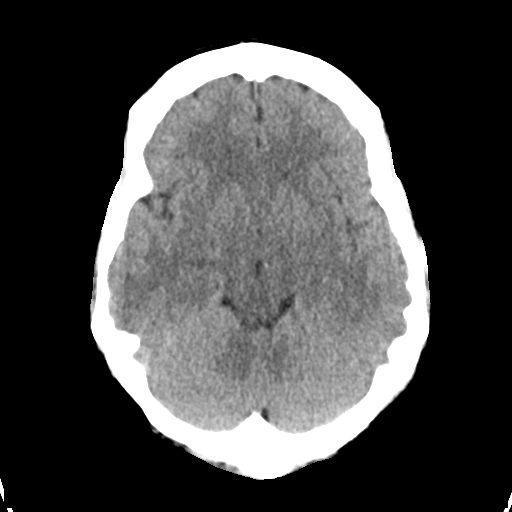
[im 14/31  brain]
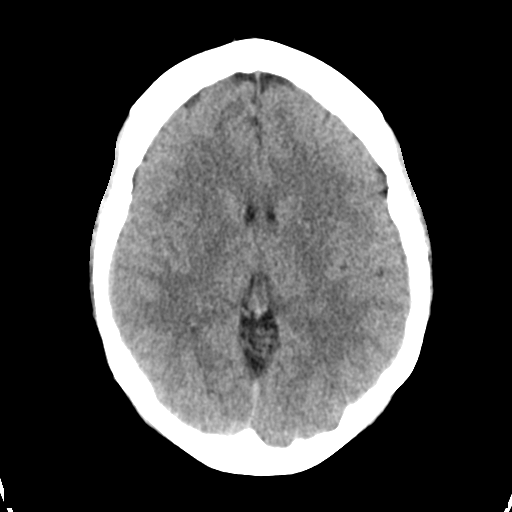
[im 17/31  brain]
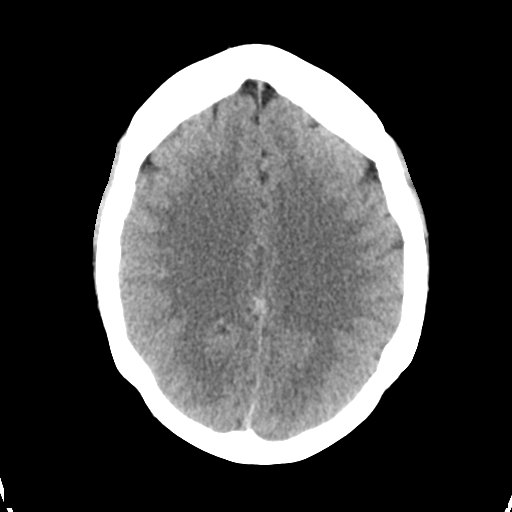
[im 17/31  bone]
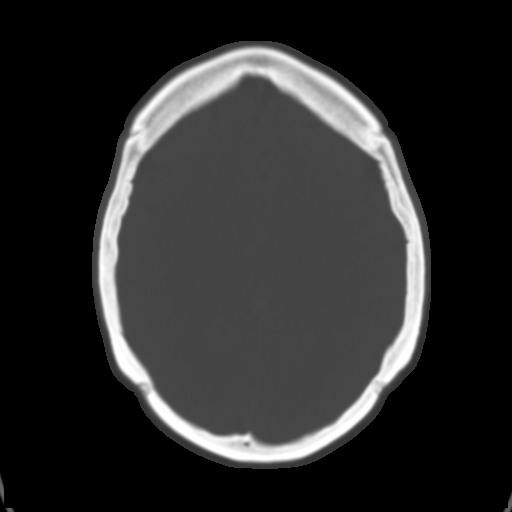
[im 21/31  brain]
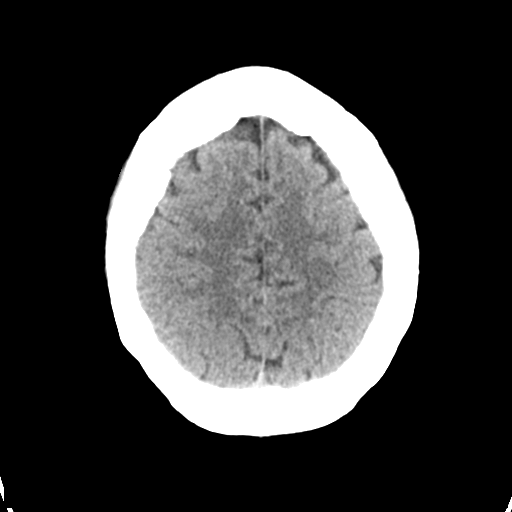
[im 24/31  brain]
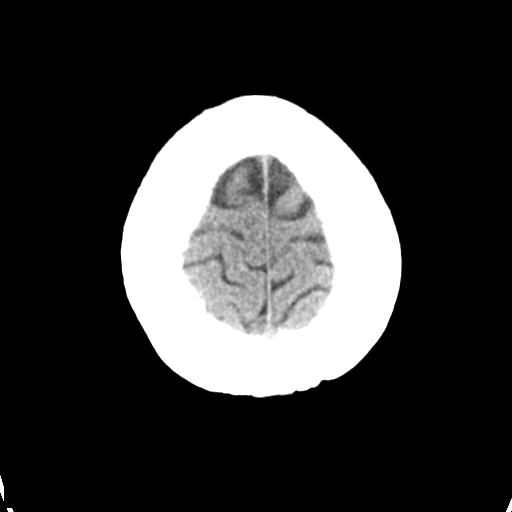
[im 28/31  brain]
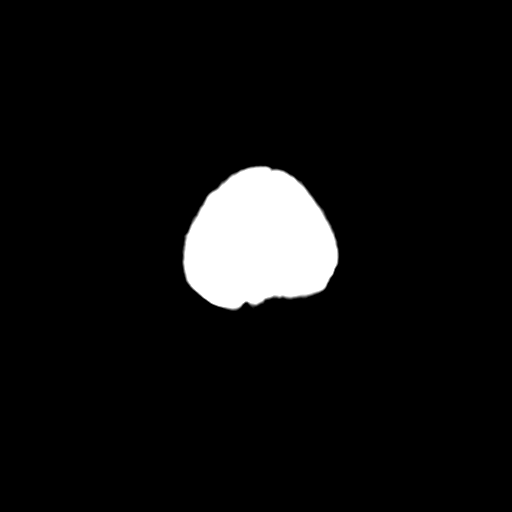

[Series 5: head 3.0 mpr cor · coronal · 0.29mm/px · 3 of 67 slices shown]
[im 23/67  brain]
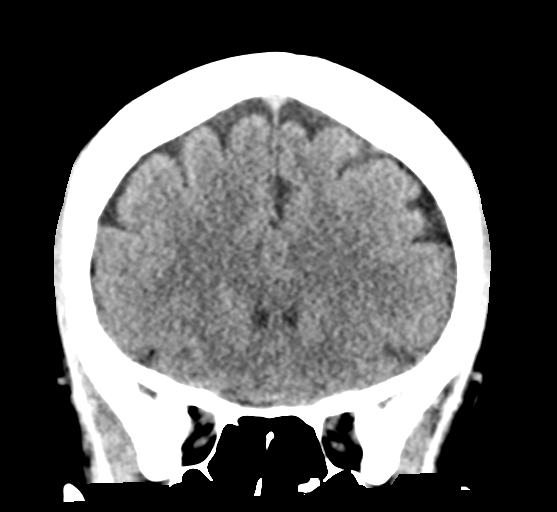
[im 30/67  brain]
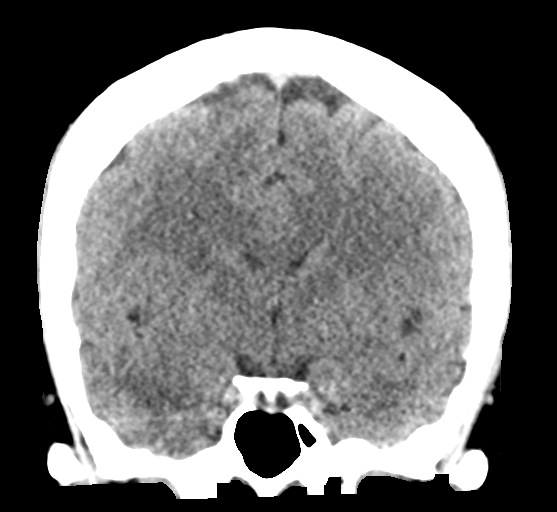
[im 37/67  brain]
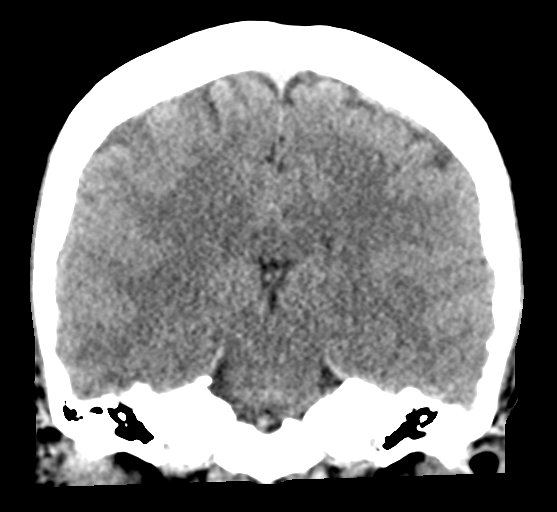

[Series 6: head 3.0 mpr sag · sagittal · 0.30mm/px · 3 of 54 slices shown]
[im 18/54  brain]
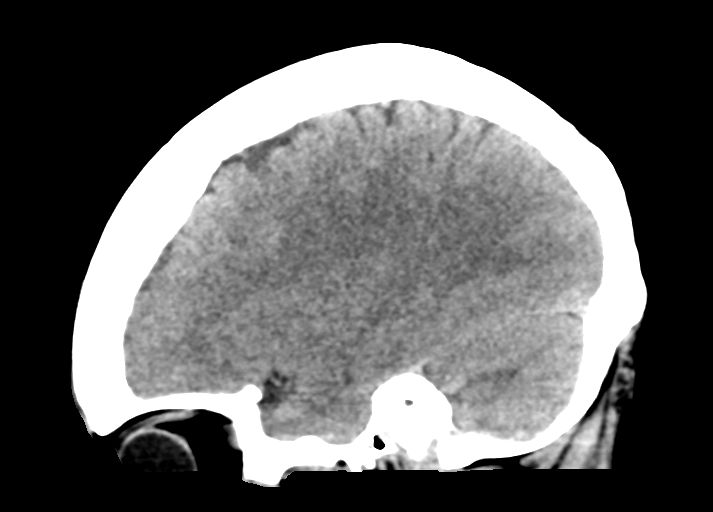
[im 27/54  brain]
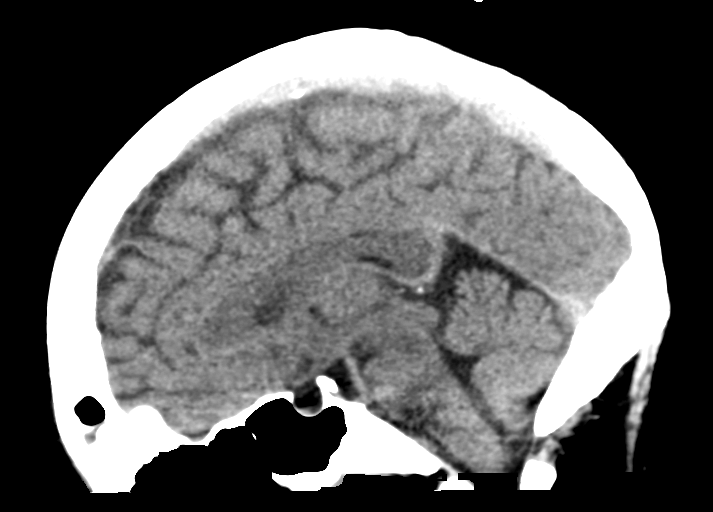
[im 36/54  brain]
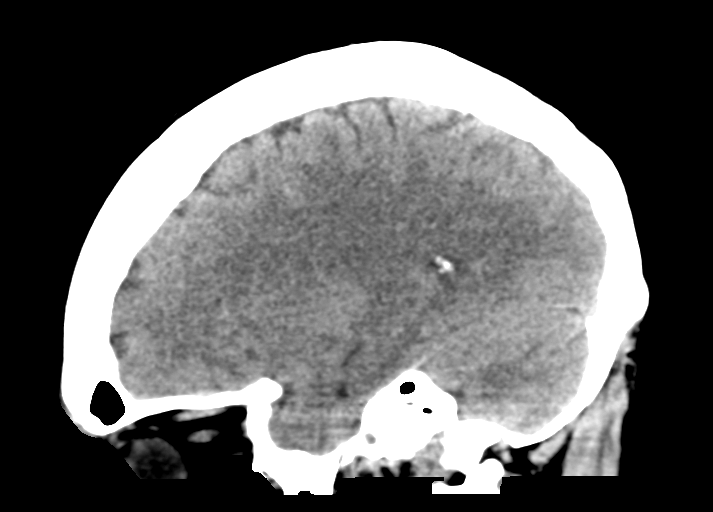

[14 of 47 positions shown; findings below may reference images not displayed]

FINDINGS: Brain:

Cerebral volume is normal.

Partially empty sella turcica.

There is no acute intracranial hemorrhage.

No demarcated cortical infarct.

No extra-axial fluid collection.

No evidence of an intracranial mass.

No midline shift.

Vascular: No hyperdense vessel.

Skull: No fracture or aggressive osseous lesion.

Sinuses/Orbits: Complete opacification of the left frontal sinus. 17
x 18 mm irregular osseous lesion within the left frontoethmoidal
recess, most compatible with an osteoma. Resultant obstruction of
the left frontal sinus drainage pathway. Mild mucosal thickening and
small-volume fluid scattered within the bilateral ethmoid sinuses.

Other: Small-volume fluid within the left mastoid air cells.
IMPRESSION: No evidence of acute intracranial abnormality.

Partially empty sella turcica. While this finding often reflects
incidental anatomic variation, it can also be associated with
idiopathic intracranial hypertension (pseudotumor cerebri).

Complete opacification of the left frontal sinus. 17 x 18 mm osteoma
within the left frontoethmoidal recess, obstructing the left frontal
sinus drainage pathway. ENT consultation is recommended.

Mild bilateral ethmoid sinusitis.

Small-volume fluid within the left mastoid air cells.

## 2022-09-11 ENCOUNTER — Ambulatory Visit: Payer: 59 | Admitting: Family Medicine

## 2022-09-21 DIAGNOSIS — J329 Chronic sinusitis, unspecified: Secondary | ICD-10-CM | POA: Insufficient documentation

## 2022-09-21 DIAGNOSIS — D169 Benign neoplasm of bone and articular cartilage, unspecified: Secondary | ICD-10-CM | POA: Insufficient documentation

## 2022-10-01 ENCOUNTER — Encounter: Payer: Self-pay | Admitting: Family Medicine

## 2022-10-01 ENCOUNTER — Ambulatory Visit (INDEPENDENT_AMBULATORY_CARE_PROVIDER_SITE_OTHER): Payer: 59 | Admitting: Family Medicine

## 2022-10-01 VITALS — Temp 98.2°F | Ht 66.0 in | Wt 166.0 lb

## 2022-10-01 DIAGNOSIS — D169 Benign neoplasm of bone and articular cartilage, unspecified: Secondary | ICD-10-CM | POA: Diagnosis not present

## 2022-10-01 DIAGNOSIS — M25511 Pain in right shoulder: Secondary | ICD-10-CM

## 2022-10-01 MED ORDER — KETOROLAC TROMETHAMINE 60 MG/2ML IM SOLN
60.0000 mg | Freq: Once | INTRAMUSCULAR | Status: AC
  Administered 2022-10-01: 60 mg via INTRAMUSCULAR

## 2022-10-01 MED ORDER — NAPROXEN 500 MG PO TABS: 500.0000 mg | ORAL_TABLET | Freq: Two times a day (BID) | ORAL | 0 refills | Status: DC

## 2022-10-01 MED ORDER — CYCLOBENZAPRINE HCL 10 MG PO TABS: 10.0000 mg | ORAL_TABLET | Freq: Every day | ORAL | 0 refills | Status: DC

## 2022-10-01 NOTE — Assessment & Plan Note (Signed)
Following with ENT. Needs clearance for sinus surgery. Will get her in ASAP for clearance

## 2022-10-01 NOTE — Progress Notes (Signed)
Temp 98.2 F (36.8 C) (Oral)   Ht _0  (1.676 m)   Wt 166 lb (75.3 kg)   SpO2 100%   BMI 26.79 kg/m    Subjective:    Patient ID: Joan Heath, female    DOB: 05-14-1986, 36 y.o.   MRN: [id number]  HPI: Joan Heath is a 36 y.o. female  Chief Complaint  Patient presents with   Shoulder Pain    Patient says she notices a shoulder discomfort in the R shoulder, but only when she coughs. Patient says it feels as if she has pulled something.    SHOULDER PAIN Duration: about 5 days ago Involved shoulder: right Mechanism of injury: unknown Location: diffuse, starts in pec and goes into her shoulder and radiates into her R arm Onset:sudden Severity: 4/10  Quality:  dull, throbbing Frequency: intermittent Radiation: yes Aggravating factors: unknown  Alleviating factors: unknown  Status: fluctuating Treatments attempted: heat and ibuprofen  Relief with NSAIDs?:  no Weakness: no change since the pain started Numbness: yes Decreased grip strength: no change since the pain started Redness: no Swelling: no Bruising: no Fevers: no  Relevant past medical, surgical, family and social history reviewed and updated as indicated. Interim medical history since our last visit reviewed. Allergies and medications reviewed and updated.  Review of Systems  Constitutional: Negative.   Respiratory: Negative.    Cardiovascular: Negative.   Gastrointestinal: Negative.   Musculoskeletal:  Positive for arthralgias and myalgias. Negative for back pain, gait problem, joint swelling, neck pain and neck stiffness.  Skin: Negative.   Neurological: Negative.   Psychiatric/Behavioral: Negative.      Per HPI unless specifically indicated above     Objective:    Temp 98.2 F (36.8 C) (Oral)   Ht _1  (1.676 m)   Wt 166 lb (75.3 kg)   SpO2 100%   BMI 26.79 kg/m   Wt Readings from Last 3 Encounters:  10/01/22 166 lb (75.3 kg)  08/06/22 164 lb 14.4 oz (74.8 kg)  07/21/20 200 lb (90.7  kg)    Physical Exam Vitals and nursing note reviewed.  Constitutional:      General: She is not in acute distress.    Appearance: Normal appearance. She is normal weight. She is not ill-appearing, toxic-appearing or diaphoretic.  HENT:     Head: Normocephalic and atraumatic.     Right Ear: External ear normal.     Left Ear: External ear normal.     Nose: Nose normal.     Mouth/Throat:     Mouth: Mucous membranes are moist.     Pharynx: Oropharynx is clear.  Eyes:     General: No scleral icterus.       Right eye: No discharge.        Left eye: No discharge.     Extraocular Movements: Extraocular movements intact.     Conjunctiva/sclera: Conjunctivae normal.     Pupils: Pupils are equal, round, and reactive to light.  Cardiovascular:     Rate and Rhythm: Normal rate and regular rhythm.     Pulses: Normal pulses.     Heart sounds: Normal heart sounds. No murmur heard.    No friction rub. No gallop.  Pulmonary:     Effort: Pulmonary effort is normal. No respiratory distress.     Breath sounds: Normal breath sounds. No stridor. No wheezing, rhonchi or rales.  Chest:     Chest wall: No tenderness.  Musculoskeletal:  General: Tenderness (point tenderness in R pec) present. No swelling, deformity or signs of injury. Normal range of motion.     Cervical back: Normal range of motion and neck supple.     Right lower leg: No edema.     Left lower leg: No edema.  Skin:    General: Skin is warm and dry.     Capillary Refill: Capillary refill takes less than 2 seconds.     Coloration: Skin is not jaundiced or pale.     Findings: No bruising, erythema, lesion or rash.  Neurological:     General: No focal deficit present.     Mental Status: She is alert and oriented to person, place, and time. Mental status is at baseline.  Psychiatric:        Mood and Affect: Mood normal.        Behavior: Behavior normal.        Thought Content: Thought content normal.        Judgment:  Judgment normal.     Results for orders placed or performed in visit on 08/06/22  Microscopic Examination   Urine  Result Value Ref Range   WBC, UA 0-5 0 - 5 /hpf   RBC, Urine None seen 0 - 2 /hpf   Epithelial Cells (non renal) 0-10 0 - 10 /hpf   Bacteria, UA None seen None seen/Few  CBC with Differential/Platelet  Result Value Ref Range   WBC 8.0 3.4 - 10.8 x10E3/uL   RBC 4.84 3.77 - 5.28 x10E6/uL   Hemoglobin 13.9 11.1 - 15.9 g/dL   Hematocrit 42.7 34.0 - 46.6 %   MCV 88 79 - 97 fL   MCH 28.7 26.6 - 33.0 pg   MCHC 32.6 31.5 - 35.7 g/dL   RDW 12.2 11.7 - 15.4 %   Platelets 246 150 - 450 x10E3/uL   Neutrophils 68 Not Estab. %   Lymphs 25 Not Estab. %   Monocytes 5 Not Estab. %   Eos 1 Not Estab. %   Basos 1 Not Estab. %   Neutrophils Absolute 5.4 1.4 - 7.0 x10E3/uL   Lymphocytes Absolute 2.0 0.7 - 3.1 x10E3/uL   Monocytes Absolute 0.4 0.1 - 0.9 x10E3/uL   EOS (ABSOLUTE) 0.1 0.0 - 0.4 x10E3/uL   Basophils Absolute 0.0 0.0 - 0.2 x10E3/uL   Immature Granulocytes 0 Not Estab. %   Immature Grans (Abs) 0.0 0.0 - 0.1 x10E3/uL  Comprehensive metabolic panel  Result Value Ref Range   Glucose 81 70 - 99 mg/dL   BUN 6 6 - 20 mg/dL   Creatinine, Ser 0.81 0.57 - 1.00 mg/dL   eGFR 96 >59 mL/min/1.73   BUN/Creatinine Ratio 7 (L) 9 - 23   Sodium 141 134 - 144 mmol/L   Potassium 4.0 3.5 - 5.2 mmol/L   Chloride 102 96 - 106 mmol/L   CO2 20 20 - 29 mmol/L   Calcium 9.3 8.7 - 10.2 mg/dL   Total Protein 6.9 6.0 - 8.5 g/dL   Albumin 4.8 3.9 - 4.9 g/dL   Globulin, Total 2.1 1.5 - 4.5 g/dL   Albumin/Globulin Ratio 2.3 (H) 1.2 - 2.2   Bilirubin Total 0.4 0.0 - 1.2 mg/dL   Alkaline Phosphatase 48 44 - 121 IU/L   AST 8 0 - 40 IU/L   ALT 8 0 - 32 IU/L  Lipid Panel w/o Chol/HDL Ratio  Result Value Ref Range   Cholesterol, Total 160 100 - 199 mg/dL   Triglycerides 90 0 -  149 mg/dL   HDL 56 >39 mg/dL   VLDL Cholesterol Cal 17 5 - 40 mg/dL   LDL Chol Calc (NIH) 87 0 - 99 mg/dL   Urinalysis, Routine w reflex microscopic  Result Value Ref Range   Specific Gravity, UA 1.015 1.005 - 1.030   pH, UA 7.5 5.0 - 7.5   Color, UA Yellow Yellow   Appearance Ur Clear Clear   Leukocytes,UA 1+ (A) Negative   Protein,UA Negative Negative/Trace   Glucose, UA Negative Negative   Ketones, UA Negative Negative   RBC, UA Negative Negative   Bilirubin, UA Negative Negative   Urobilinogen, Ur 0.2 0.2 - 1.0 mg/dL   Nitrite, UA Negative Negative   Microscopic Examination See below:   TSH  Result Value Ref Range   TSH 1.610 0.450 - 4.500 uIU/mL  VITAMIN D 25 Hydroxy (Vit-D Deficiency, Fractures)  Result Value Ref Range   Vit D, 25-Hydroxy 11.4 (L) 30.0 - 100.0 ng/mL  Bayer DCA Hb A1c Waived  Result Value Ref Range   HB A1C (BAYER DCA - WAIVED) 5.2 4.8 - 5.6 %  LH  Result Value Ref Range   LH 13.9 mIU/mL  FSH  Result Value Ref Range   FSH 5.1 mIU/mL  Estradiol  Result Value Ref Range   Estradiol 71.8 pg/mL  B12  Result Value Ref Range   Vitamin B-12 399 232 - 1,245 pg/mL      Assessment & Plan:   Problem List Items Addressed This Visit       Musculoskeletal and Integument   Osteoma    Following with ENT. Needs clearance for sinus surgery. Will get her in ASAP for clearance      Other Visit Diagnoses     Acute pain of right shoulder    -  Primary   Will treat with toradol, naproxen and flexeril and stretches. Call with any concerns. Continue to monitor. call with any concerns.   Relevant Medications   ketorolac (TORADOL) injection 60 mg (Completed) (Start on 10/01/2022 11:15 AM)        Follow up plan: Return for ASAP surgical clearance.

## 2022-10-07 NOTE — Patient Instructions (Incomplete)

## 2022-10-09 ENCOUNTER — Ambulatory Visit: Payer: 59 | Admitting: Nurse Practitioner

## 2023-01-25 ENCOUNTER — Ambulatory Visit
Admission: RE | Admit: 2023-01-25 | Discharge: 2023-01-25 | Disposition: A | Discharge status: Home / Self Care | Payer: BC Managed Care – PPO | Source: Ambulatory Visit | Attending: Family Medicine

## 2023-01-25 DIAGNOSIS — Z1231 Encounter for screening mammogram for malignant neoplasm of breast: Secondary | ICD-10-CM | POA: Diagnosis not present

## 2023-01-25 DIAGNOSIS — Z803 Family history of malignant neoplasm of breast: Secondary | ICD-10-CM

## 2023-01-30 ENCOUNTER — Encounter: Payer: Self-pay | Admitting: Family Medicine

## 2023-01-30 ENCOUNTER — Ambulatory Visit: Payer: BC Managed Care – PPO | Admitting: Family Medicine

## 2023-01-30 VITALS — BP 96/62 | HR 73 | Temp 98.7°F | Wt 169.1 lb

## 2023-01-30 DIAGNOSIS — Z01818 Encounter for other preprocedural examination: Secondary | ICD-10-CM | POA: Diagnosis not present

## 2023-01-30 DIAGNOSIS — F410 Panic disorder [episodic paroxysmal anxiety] without agoraphobia: Secondary | ICD-10-CM | POA: Diagnosis not present

## 2023-01-30 DIAGNOSIS — F430 Acute stress reaction: Secondary | ICD-10-CM | POA: Diagnosis not present

## 2023-01-30 MED ORDER — LORAZEPAM 0.5 MG PO TABS: 0.5000 mg | ORAL_TABLET | Freq: Every evening | ORAL | 0 refills | Status: AC | PRN

## 2023-01-30 NOTE — Progress Notes (Signed)
BP 96/62   Pulse 73   Temp 98.7 F (37.1 C) (Oral)   Wt 169 lb 1.6 oz (76.7 kg)   SpO2 95%   BMI 27.29 kg/m    Subjective:    Patient ID: Joan Heath, female    DOB: 03/04/1986, 37 y.o.   MRN: [id number]  HPI: Joan Heath is a 37 y.o. female  Chief Complaint  Patient presents with   Medical Clearance   Joan Heath presents today for medical clearance for sinus surgery to have an osteoma removed. She notes that she has been very anxious as the surgery has been coming up and has had several panic attacks. She had one in our office today. She has had several surgeries in the past including CCY and appendix. She has never had any problems with anesthesia in the past. She has always gone home when scheduled. No issues with extubation. No family history of any issues with anesthesia. No family history of malignant hyperthermia. She is feeling well. No SOB. No CP. No wheezing. She does have headaches from the osteoma. She is able to walk up 3 flights of stairs without SOB or CP. No other concerns or complaints at this time.    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   Osteoma 09/21/2022   Resolved Ambulatory Problems    Diagnosis Date Noted   Supervision of other normal pregnancy 06/25/2013   Tobacco smoking complicating pregnancy 06/25/2013   Bipolar disease in pregnancy 06/25/2013   Previous cesarean section complicating pregnancy 06/25/2013   Obesity in pregnancy 09/22/2013   Muscle spasm 09/22/2013   Desires Sterilization 10/27/2013   Pregnancy 01/21/2014   Status post cesarean section 01/23/2014    Chlamydia infection 01/12/2015   Past Medical History:  Diagnosis Date   Bipolar 1 disorder    GERD (gastroesophageal reflux disease)    Headache    Past Surgical History:  Procedure Laterality Date   APPENDECTOMY     BREAST BIOPSY Right 2016   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   CESAREAN SECTION  2007   CESAREAN SECTION WITH BILATERAL TUBAL LIGATION N/A 01/22/2014   Procedure: CESAREAN SECTION WITH BILATERAL TUBAL LIGATION;  Surgeon: Levie Heritage, DO;  Location: [facility];  Service: Gynecology;  Laterality: N/A;   CHOLECYSTECTOMY     DILITATION & CURRETTAGE/HYSTROSCOPY WITH HYDROTHERMAL ABLATION N/A 03/09/2015   Procedure: HYSTEROSCOPY WITH HYDROTHERMAL ABLATION;  Surgeon: Tereso Newcomer, MD;  Location: [facility];  Service: Gynecology;  Laterality: N/A;   TUBAL LIGATION     WISDOM TOOTH EXTRACTION     Outpatient Encounter Medications as of 01/30/2023  Medication Sig   LORazepam (ATIVAN) 0.5 MG tablet Take 1 tablet (0.5 mg total) by mouth at bedtime as needed for anxiety. May take 1 additional pill in the AM before surgery as long as it's 6 hours before surgery   Vitamin D, Ergocalciferol, (DRISDOL) 1.25 MG (50000 UNIT) CAPS capsule Take 1 capsule (50,000 Units total) by mouth every 7 (seven) days.   [DISCONTINUED] cyclobenzaprine (FLEXERIL) 10 MG tablet Take 1 tablet (10 mg total) by mouth at bedtime.   [DISCONTINUED] naproxen (NAPROSYN) 500 MG  tablet Take 1 tablet (500 mg total) by mouth 2 (two) times daily with a meal.   No facility-administered encounter medications on file as of 01/30/2023.   Allergies  Allergen Reactions   Peanuts [Peanut Oil] Anaphylaxis   Cinnamon Swelling   Family History  Problem Relation Age of Onset   Cancer Sister 58       lumpectomy for breast cancer   Breast cancer Sister 43   Cervical cancer Sister    COPD Maternal Grandmother    Bladder Cancer Maternal Grandmother    Bladder Cancer Maternal Great-grandfather    Birth defects Neg Hx    Asthma Neg Hx     Arthritis Neg Hx    Alcohol abuse Neg Hx    Drug abuse Neg Hx    Diabetes Neg Hx    Depression Neg Hx    Early death Neg Hx    Hearing loss Neg Hx    Heart disease Neg Hx    Hyperlipidemia Neg Hx    Hypertension Neg Hx    Learning disabilities Neg Hx    Kidney disease Neg Hx    Mental illness Neg Hx    Mental retardation Neg Hx    Miscarriages / Stillbirths Neg Hx    Stroke Neg Hx    Vision loss Neg Hx    Social History   Socioeconomic History   Marital status: Single    Spouse name: Not on file   Number of children: Not on file   Years of education: Not on file   Highest education level: Not on file  Occupational History   Not on file  Tobacco Use   Smoking status: Former    Packs/day: 0.25    Years: 6.00    Additional pack years: 0.00    Total pack years: 1.50    Types: Cigarettes    Quit date: 08/10/2013    Years since quitting: 9.4   Smokeless tobacco: Never  Vaping Use   Vaping Use: Never used  Substance and Sexual Activity   Alcohol use: Yes    Comment: socially   Drug use: No    Types: Marijuana    Comment: last use in 10/2013   Sexual activity: Yes    Birth control/protection: Surgical  Other Topics Concern   Not on file  Social History Narrative   Not on file   Social Determinants of Health   Financial Resource Strain: Not on file  Food Insecurity: Not on file  Transportation Needs: Not on file  Physical Activity: Not on file  Stress: Not on file  Social Connections: Not on file    Review of Systems  Constitutional: Negative.   HENT: Negative.    Respiratory: Negative.    Cardiovascular: Negative.   Gastrointestinal: Negative.   Neurological:  Positive for headaches. Negative for dizziness, tremors, seizures, syncope, facial asymmetry, speech difficulty, weakness, light-headedness and numbness.  Psychiatric/Behavioral: Negative.      Per HPI unless specifically indicated above     Objective:    BP 96/62   Pulse 73   Temp 98.7 F  (37.1 C) (Oral)   Wt 169 lb 1.6 oz (76.7 kg)   SpO2 95%   BMI 27.29 kg/m   Wt Readings from Last 3 Encounters:  01/30/23 169 lb 1.6 oz (76.7 kg)  10/01/22 166 lb (75.3 kg)  08/06/22 164 lb 14.4 oz (74.8 kg)    Physical Exam Vitals and nursing note reviewed.  Constitutional:  General: She is not in acute distress.    Appearance: Normal appearance. She is normal weight. She is not ill-appearing, toxic-appearing or diaphoretic.  HENT:     Head: Normocephalic and atraumatic.     Right Ear: External ear normal.     Left Ear: External ear normal.     Nose: Nose normal.     Mouth/Throat:     Mouth: Mucous membranes are moist.     Pharynx: Oropharynx is clear.  Eyes:     General: No scleral icterus.       Right eye: No discharge.        Left eye: No discharge.     Extraocular Movements: Extraocular movements intact.     Conjunctiva/sclera: Conjunctivae normal.     Pupils: Pupils are equal, round, and reactive to light.  Cardiovascular:     Rate and Rhythm: Normal rate and regular rhythm.     Pulses: Normal pulses.     Heart sounds: Normal heart sounds. No murmur heard.    No friction rub. No gallop.  Pulmonary:     Effort: Pulmonary effort is normal. No respiratory distress.     Breath sounds: Normal breath sounds. No stridor. No wheezing, rhonchi or rales.  Chest:     Chest wall: No tenderness.  Musculoskeletal:        General: Normal range of motion.     Cervical back: Normal range of motion and neck supple.  Skin:    General: Skin is warm and dry.     Capillary Refill: Capillary refill takes less than 2 seconds.     Coloration: Skin is not jaundiced or pale.     Findings: No bruising, erythema, lesion or rash.  Neurological:     General: No focal deficit present.     Mental Status: She is alert and oriented to person, place, and time. Mental status is at baseline.  Psychiatric:        Mood and Affect: Mood normal.        Behavior: Behavior normal.        Thought  Content: Thought content normal.        Judgment: Judgment normal.     Results for orders placed or performed in visit on 08/06/22  Microscopic Examination   Urine  Result Value Ref Range   WBC, UA 0-5 0 - 5 /hpf   RBC, Urine None seen 0 - 2 /hpf   Epithelial Cells (non renal) 0-10 0 - 10 /hpf   Bacteria, UA None seen None seen/Few  CBC with Differential/Platelet  Result Value Ref Range   WBC 8.0 3.4 - 10.8 x10E3/uL   RBC 4.84 3.77 - 5.28 x10E6/uL   Hemoglobin 13.9 11.1 - 15.9 g/dL   Hematocrit 78.2 95.6 - 46.6 %   MCV 88 79 - 97 fL   MCH 28.7 26.6 - 33.0 pg   MCHC 32.6 31.5 - 35.7 g/dL   RDW 21.3 08.6 - 57.8 %   Platelets 246 150 - 450 x10E3/uL   Neutrophils 68 Not Estab. %   Lymphs 25 Not Estab. %   Monocytes 5 Not Estab. %   Eos 1 Not Estab. %   Basos 1 Not Estab. %   Neutrophils Absolute 5.4 1.4 - 7.0 x10E3/uL   Lymphocytes Absolute 2.0 0.7 - 3.1 x10E3/uL   Monocytes Absolute 0.4 0.1 - 0.9 x10E3/uL   EOS (ABSOLUTE) 0.1 0.0 - 0.4 x10E3/uL   Basophils Absolute 0.0 0.0 - 0.2 x10E3/uL  Immature Granulocytes 0 Not Estab. %   Immature Grans (Abs) 0.0 0.0 - 0.1 x10E3/uL  Comprehensive metabolic panel  Result Value Ref Range   Glucose 81 70 - 99 mg/dL   BUN 6 6 - 20 mg/dL   Creatinine, Ser 9.14 0.57 - 1.00 mg/dL   eGFR 96 >78 GN/FAO/1.30   BUN/Creatinine Ratio 7 (L) 9 - 23   Sodium 141 134 - 144 mmol/L   Potassium 4.0 3.5 - 5.2 mmol/L   Chloride 102 96 - 106 mmol/L   CO2 20 20 - 29 mmol/L   Calcium 9.3 8.7 - 10.2 mg/dL   Total Protein 6.9 6.0 - 8.5 g/dL   Albumin 4.8 3.9 - 4.9 g/dL   Globulin, Total 2.1 1.5 - 4.5 g/dL   Albumin/Globulin Ratio 2.3 (H) 1.2 - 2.2   Bilirubin Total 0.4 0.0 - 1.2 mg/dL   Alkaline Phosphatase 48 44 - 121 IU/L   AST 8 0 - 40 IU/L   ALT 8 0 - 32 IU/L  Lipid Panel w/o Chol/HDL Ratio  Result Value Ref Range   Cholesterol, Total 160 100 - 199 mg/dL   Triglycerides 90 0 - 149 mg/dL   HDL 56 >86 mg/dL   VLDL Cholesterol Cal 17 5 - 40  mg/dL   LDL Chol Calc (NIH) 87 0 - 99 mg/dL  Urinalysis, Routine w reflex microscopic  Result Value Ref Range   Specific Gravity, UA 1.015 1.005 - 1.030   pH, UA 7.5 5.0 - 7.5   Color, UA Yellow Yellow   Appearance Ur Clear Clear   Leukocytes,UA 1+ (A) Negative   Protein,UA Negative Negative/Trace   Glucose, UA Negative Negative   Ketones, UA Negative Negative   RBC, UA Negative Negative   Bilirubin, UA Negative Negative   Urobilinogen, Ur 0.2 0.2 - 1.0 mg/dL   Nitrite, UA Negative Negative   Microscopic Examination See below:   TSH  Result Value Ref Range   TSH 1.610 0.450 - 4.500 uIU/mL  VITAMIN D 25 Hydroxy (Vit-D Deficiency, Fractures)  Result Value Ref Range   Vit D, 25-Hydroxy 11.4 (L) 30.0 - 100.0 ng/mL  Bayer DCA Hb A1c Waived  Result Value Ref Range   HB A1C (BAYER DCA - WAIVED) 5.2 4.8 - 5.6 %  LH  Result Value Ref Range   LH 13.9 mIU/mL  FSH  Result Value Ref Range   FSH 5.1 mIU/mL  Estradiol  Result Value Ref Range   Estradiol 71.8 pg/mL  B12  Result Value Ref Range   Vitamin B-12 399 232 - 1,245 pg/mL      Assessment & Plan:   Problem List Items Addressed This Visit   None Visit Diagnoses     Preop exam for internal medicine    -  Primary   EKG normal. Will check labs. If they are normal, will be cleared for surgery.   Relevant Orders   CBC with Differential/Platelet   Basic metabolic panel   EKG 12-Lead (Completed)   INR/PT   Panic attack as reaction to stress       Will give short course of ativan leading up to her surgery. Attempted to call ENT to discuss this 3x today and no answer. Note to be sent to them.   Relevant Medications   LORazepam (ATIVAN) 0.5 MG tablet        Follow up plan: Return As scheduled.

## 2023-01-30 NOTE — Progress Notes (Signed)
Interpreted by me today. NSR at 71bpm. No ST segment changes

## 2023-01-31 LAB — CBC WITH DIFFERENTIAL/PLATELET
Basophils Absolute: 0.1 10*3/uL (ref 0.0–0.2)
Basos: 1 %
EOS (ABSOLUTE): 0.1 10*3/uL (ref 0.0–0.4)
Eos: 1 %
Hematocrit: 42.4 % (ref 34.0–46.6)
Hemoglobin: 14.1 g/dL (ref 11.1–15.9)
Immature Grans (Abs): 0 10*3/uL (ref 0.0–0.1)
Immature Granulocytes: 0 %
Lymphocytes Absolute: 2.1 10*3/uL (ref 0.7–3.1)
Lymphs: 21 %
MCH: 29.8 pg (ref 26.6–33.0)
MCHC: 33.3 g/dL (ref 31.5–35.7)
MCV: 90 fL (ref 79–97)
Monocytes Absolute: 0.5 10*3/uL (ref 0.1–0.9)
Monocytes: 5 %
Neutrophils Absolute: 7.5 10*3/uL — ABNORMAL HIGH (ref 1.4–7.0)
Neutrophils: 72 %
Platelets: 260 10*3/uL (ref 150–450)
RBC: 4.73 x10E6/uL (ref 3.77–5.28)
RDW: 12.1 % (ref 11.7–15.4)
WBC: 10.3 10*3/uL (ref 3.4–10.8)

## 2023-01-31 LAB — BASIC METABOLIC PANEL
BUN/Creatinine Ratio: 8 — ABNORMAL LOW (ref 9–23)
BUN: 6 mg/dL (ref 6–20)
CO2: 20 mmol/L (ref 20–29)
Calcium: 9.3 mg/dL (ref 8.7–10.2)
Chloride: 102 mmol/L (ref 96–106)
Creatinine, Ser: 0.8 mg/dL (ref 0.57–1.00)
Glucose: 95 mg/dL (ref 70–99)
Potassium: 4.2 mmol/L (ref 3.5–5.2)
Sodium: 139 mmol/L (ref 134–144)
eGFR: 97 mL/min/{1.73_m2} (ref >59)

## 2023-01-31 LAB — PROTIME-INR
INR: 1 (ref 0.9–1.2)
Prothrombin Time: 10.6 s (ref 9.1–12.0)

## 2023-01-31 NOTE — Progress Notes (Signed)
Patient notified of results  by mychart  Cleared for surgery, OK to send notes to ENT at [facility]

## 2023-02-05 DIAGNOSIS — Z8673 Personal history of transient ischemic attack (TIA), and cerebral infarction without residual deficits: Secondary | ICD-10-CM | POA: Diagnosis not present

## 2023-02-05 DIAGNOSIS — D164 Benign neoplasm of bones of skull and face: Secondary | ICD-10-CM | POA: Diagnosis not present

## 2023-02-05 DIAGNOSIS — J322 Chronic ethmoidal sinusitis: Secondary | ICD-10-CM | POA: Diagnosis not present

## 2023-02-05 DIAGNOSIS — J328 Other chronic sinusitis: Secondary | ICD-10-CM | POA: Diagnosis not present

## 2023-02-05 DIAGNOSIS — J329 Chronic sinusitis, unspecified: Secondary | ICD-10-CM | POA: Diagnosis not present

## 2023-02-05 DIAGNOSIS — Z20822 Contact with and (suspected) exposure to covid-19: Secondary | ICD-10-CM | POA: Diagnosis not present

## 2023-02-07 ENCOUNTER — Telehealth: Payer: Self-pay

## 2023-02-07 NOTE — Transitions of Care (Post Inpatient/ED Visit) (Signed)
   02/07/2023  Name: [name] MRN: [id number] DOB: Jan 29, 1986  Today's TOC FU Call Status: Today's TOC FU Call Status:: Unsuccessul Call (1st Attempt) Unsuccessful Call (1st Attempt) Date: 02/07/23  Attempted to reach the patient regarding the most recent Inpatient/ED visit.  Follow Up Plan: Additional outreach attempts will be made to reach the patient to complete the Transitions of Care (Post Inpatient/ED visit) call.   Signature Karena Addison, LPN [facility] Nurse Health Advisor Direct Dial [phone]

## 2023-02-07 NOTE — Transitions of Care (Post Inpatient/ED Visit) (Signed)
   02/07/2023  Name: Tessica Cupo MRN: [id number] DOB: [birth year]  Today's TOC FU Call Status: Today's TOC FU Call Status:: Successful TOC FU Call Competed Unsuccessful Call (1st Attempt) Date: 02/07/23 [facility] FU Call Complete Date: 02/07/23  Transition Care Management Follow-up Telephone Call Date of Discharge: 02/06/23 Discharge Facility: Other (Non[facility] Facility) Name of Other ([facility]) Discharge Facility: [facility] Type of Discharge: Inpatient Admission Primary Inpatient Discharge Diagnosis:: neoplasm of bone How have you been since you were released from the hospital?: Better Any questions or concerns?: No  Items Reviewed: Did you receive and understand the discharge instructions provided?: Yes Medications obtained and verified?: Yes (Medications Reviewed) Any new allergies since your discharge?: No Dietary orders reviewed?: Yes Do you have support at home?: Yes People in Home: child(ren), adult, significant other  Home Care and Equipment/Supplies: Were Home Health Services Ordered?: NA Any new equipment or medical supplies ordered?: NA  Functional Questionnaire: Do you need assistance with bathing/showering or dressing?: No Do you need assistance with meal preparation?: No Do you need assistance with eating?: No Do you have difficulty maintaining continence: No Do you need assistance with getting out of bed/getting out of a chair/moving?: No Do you have difficulty managing or taking your medications?: No  Follow up appointments reviewed: PCP Follow-up appointment confirmed?: NA Specialist Hospital Follow-up appointment confirmed?: Yes Date of Specialist follow-up appointment?: 02/13/23 Follow-Up Specialty Provider:: Dr Fenton Malling Do you need transportation to your follow-up appointment?: No Do you understand care options if your condition(s) worsen?: Yes-patient verbalized understanding    SIGNATURE Karena Addison, LPN [facility] Nurse Health Advisor Direct Dial [phone]

## 2023-02-13 DIAGNOSIS — J310 Chronic rhinitis: Secondary | ICD-10-CM | POA: Diagnosis not present

## 2023-02-27 DIAGNOSIS — J310 Chronic rhinitis: Secondary | ICD-10-CM | POA: Diagnosis not present

## 2023-04-17 ENCOUNTER — Ambulatory Visit: Payer: 59 | Admitting: Dermatology

## 2023-06-11 ENCOUNTER — Ambulatory Visit: Payer: 59 | Admitting: Dermatology

## 2023-10-02 ENCOUNTER — Ambulatory Visit: Payer: Self-pay

## 2023-10-02 ENCOUNTER — Emergency Department: Payer: BC Managed Care – PPO

## 2023-10-02 ENCOUNTER — Other Ambulatory Visit: Payer: Self-pay

## 2023-10-02 ENCOUNTER — Emergency Department
Admission: EM | Admit: 2023-10-02 | Discharge: 2023-10-02 | Disposition: A | Discharge status: Home / Self Care | Payer: BC Managed Care – PPO | Attending: Emergency Medicine | Admitting: Emergency Medicine

## 2023-10-02 DIAGNOSIS — R531 Weakness: Secondary | ICD-10-CM | POA: Diagnosis not present

## 2023-10-02 DIAGNOSIS — Z91010 Allergy to peanuts: Secondary | ICD-10-CM | POA: Insufficient documentation

## 2023-10-02 DIAGNOSIS — R202 Paresthesia of skin: Secondary | ICD-10-CM | POA: Diagnosis not present

## 2023-10-02 DIAGNOSIS — F410 Panic disorder [episodic paroxysmal anxiety] without agoraphobia: Secondary | ICD-10-CM | POA: Insufficient documentation

## 2023-10-02 DIAGNOSIS — J0100 Acute maxillary sinusitis, unspecified: Secondary | ICD-10-CM | POA: Insufficient documentation

## 2023-10-02 DIAGNOSIS — R200 Anesthesia of skin: Secondary | ICD-10-CM | POA: Diagnosis not present

## 2023-10-02 LAB — CBC
HCT: 40.1 % (ref 36.0–46.0)
Hemoglobin: 13.9 g/dL (ref 12.0–15.0)
MCH: 30.1 pg (ref 26.0–34.0)
MCHC: 34.7 g/dL (ref 30.0–36.0)
MCV: 86.8 fL (ref 80.0–100.0)
Platelets: 289 10*3/uL (ref 150–400)
RBC: 4.62 MIL/uL (ref 3.87–5.11)
RDW: 12.1 % (ref 11.5–15.5)
WBC: 12.7 10*3/uL — ABNORMAL HIGH (ref 4.0–10.5)
nRBC: 0 % (ref 0.0–0.2)

## 2023-10-02 LAB — BASIC METABOLIC PANEL
Anion gap: 11 (ref 5–15)
BUN: 7 mg/dL (ref 6–20)
CO2: 20 mmol/L — ABNORMAL LOW (ref 22–32)
Calcium: 9 mg/dL (ref 8.9–10.3)
Chloride: 106 mmol/L (ref 98–111)
Creatinine, Ser: 0.82 mg/dL (ref 0.44–1.00)
GFR, Estimated: >60 mL/min (ref >60)
Glucose, Bld: 111 mg/dL — ABNORMAL HIGH (ref 70–99)
Potassium: 3.6 mmol/L (ref 3.5–5.1)
Sodium: 137 mmol/L (ref 135–145)

## 2023-10-02 LAB — TROPONIN I (HIGH SENSITIVITY): Troponin I (High Sensitivity): <2 ng/L (ref <18)

## 2023-10-02 MED ORDER — AMOXICILLIN-POT CLAVULANATE 875-125 MG PO TABS: 1.0000 | ORAL_TABLET | Freq: Two times a day (BID) | ORAL | 0 refills | Status: AC

## 2023-10-02 NOTE — Telephone Encounter (Signed)
     Chief Complaint: Pt. Feeling anxious. " My body feels heavy and tingly all over just like last year when I had mini strokes." Pt. Tearful, "I'm scared it could be a mini stroke." Symptoms: Above Frequency: Today Pertinent Negatives: Patient denies any thoughts of self harm. Disposition: [x] ED /[] Urgent Care (no appt availability in office) / [] Appointment(In office/virtual)/ []  [facility]/ [] Home Care/ [] Refused Recommended Disposition /[] [facility]/ []  Follow-up with PCP Additional Notes: Agrees with ED.  Reason for Disposition  Patient sounds very sick or weak to the triager  Answer Assessment - Initial Assessment Questions 1. CONCERN: "Did anything happen that prompted you to call today?"      Anxiety, tingling all over, body feels heavy 2. ANXIETY SYMPTOMS: "Can you describe how you (your loved one; patient) have been feeling?" (e.g., tense, restless, panicky, anxious, keyed up, overwhelmed, sense of impending doom).      Anxiety 3. ONSET: "How long have you been feeling this way?" (e.g., hours, days, weeks)     Today 4. SEVERITY: "How would you rate the level of anxiety?" (e.g., 0 - 10; or mild, moderate, severe).     Moderate 5. FUNCTIONAL IMPAIRMENT: "How have these feelings affected your ability to do daily activities?" "Have you had more difficulty than usual doing your normal daily activities?" (e.g., getting better, same, worse; self-care, school, work, interactions)     Yes 6. HISTORY: "Have you felt this way before?" "Have you ever been diagnosed with an anxiety problem in the past?" (e.g., generalized anxiety disorder, panic attacks, PTSD). If Yes, ask: "How was this problem treated?" (e.g., medicines, counseling, etc.)     Yes 7. RISK OF HARM - SUICIDAL IDEATION: "Do you ever have thoughts of hurting or killing yourself?" If Yes, ask:  "Do you have these feelings now?" "Do you have a plan on how you would do this?"     No 8. TREATMENT:  "What  has been done so far to treat this anxiety?" (e.g., medicines, relaxation strategies). "What has helped?"     Medication 9. TREATMENT - THERAPIST: "Do you have a counselor or therapist? Name?"     No 10. POTENTIAL TRIGGERS: "Do you drink caffeinated beverages (e.g., coffee, colas, teas), and how much daily?" "Do you drink alcohol or use any drugs?" "Have you started any new medicines recently?"       No 11. PATIENT SUPPORT: "Who is with you now?" "Who do you live with?" "Do you have family or friends who you can talk to?"        Can call family 19. OTHER SYMPTOMS: "Do you have any other symptoms?" (e.g., feeling depressed, trouble concentrating, trouble sleeping, trouble breathing, palpitations or fast heartbeat, chest pain, sweating, nausea, or diarrhea)       Body feels heavy, tingly 13. PREGNANCY: "Is there any chance you are pregnant?" "When was your last menstrual period?"       No  Protocols used: Anxiety and Panic Attack-A-AH

## 2023-10-02 NOTE — ED Provider Triage Note (Signed)
Emergency Medicine Provider Triage Evaluation Note  Joan Heath , a 37 y.o. female  was evaluated in triage.  Pt complains of numbness of her body that started a 6 am.  Patient states that now it is mainly her right arm.  Some CP but denies SOB.    Review of Systems  Positive: + stroke per patient.  Anxiety, bipolar.  Negative: No fever, uri sx   Physical Exam  BP (!) 131/94   Pulse 90   Temp 98.5 F (36.9 C) (Oral)   Resp 18   Ht 5\' 6"  (1.676 m)   Wt 77.1 kg   SpO2 98%   BMI 27.44 kg/m  Gen:   Awake, no distress  talkative Resp:  Normal effort Lungs clear MSK:   Moves extremities without difficulty   good muscle strength.  Other:  Cranial nerves 2-12 gross intact, speech normal  Medical Decision Making  Medically screening exam initiated at 12:50 PM.  Appropriate orders placed.  Joan Heath was informed that the remainder of the evaluation will be completed by another provider, this initial triage assessment does not replace that evaluation, and the importance of remaining in the ED until their evaluation is complete.     Tommi Rumps, PA-C 10/02/23 1255

## 2023-10-02 NOTE — ED Triage Notes (Addendum)
Pt presents to ED with c/o all body numbness/tingling at 0600 this morning, pt states awake. Pt states R hand tingling. Pt also c/o of CP.   Pt states she had strokes prior, this RN could not find HX of any in chart review. Pt denies taking blood thinners.   Pt states HX of anxiety, pt appears anxious in triage.

## 2023-10-02 NOTE — Discharge Instructions (Addendum)
Please take antibiotic as prescribed.  Push fluids return to the ER for any worsening symptoms or any urgent changes in your health.

## 2023-10-02 NOTE — ED Provider Notes (Signed)
[place] EMERGENCY DEPARTMENT AT [facility] Provider Note   CSN: [id number] Arrival date & time: 10/02/23  1238     History  Chief Complaint  Patient presents with   Numbness    Joan Heath is a 37 y.o. female with history of anxiety, bipolar 1, GERD, headaches presents to the emergency department for evaluation of numbness and tingling in both hands and legs earlier today upon awakening.  Patient states symptoms have improved currently.  Earlier today she felt shaky and started to have a panic attack.  Patient has had anxiety/panic attacks in the past.  She denies any chest pain shortness of breath fevers or chills.  No weakness or vision changes.  She has been ambulatory with no dizziness.  HPI     Home Medications Prior to Admission medications   Medication Sig Start Date End Date Taking? Authorizing Provider  amoxicillin-clavulanate (AUGMENTIN) 875-125 MG tablet Take 1 tablet by mouth 2 (two) times daily for 10 days. 10/02/23 10/12/23 Yes Evon Slack, PA-C  LORazepam (ATIVAN) 0.5 MG tablet Take 1 tablet (0.5 mg total) by mouth at bedtime as needed for anxiety. May take 1 additional pill in the AM before surgery as long as it's 6 hours before surgery 01/30/23   Olevia Perches P, DO  Vitamin D, Ergocalciferol, (DRISDOL) 1.25 MG (50000 UNIT) CAPS capsule Take 1 capsule (50,000 Units total) by mouth every 7 (seven) days. 08/07/22   Olevia Perches P, DO      Allergies    Peanuts [peanut oil] and Cinnamon    Review of Systems   Review of Systems  Physical Exam Updated Vital Signs BP (!) 131/94   Pulse 90   Temp 98.5 F (36.9 C) (Oral)   Resp 18   Ht 5\' 6"  (1.676 m)   Wt 77.1 kg   SpO2 98%   BMI 27.44 kg/m  Physical Exam Constitutional:      Appearance: Normal appearance. She is well-developed.  HENT:     Head: Normocephalic and atraumatic.     Right Ear: External ear normal.     Left Ear: External ear normal.     Mouth/Throat:     Pharynx: No  oropharyngeal exudate or posterior oropharyngeal erythema.  Eyes:     Extraocular Movements: Extraocular movements intact.     Conjunctiva/sclera: Conjunctivae normal.     Pupils: Pupils are equal, round, and reactive to light.  Cardiovascular:     Rate and Rhythm: Normal rate.     Pulses: Normal pulses.     Heart sounds: Normal heart sounds.  Pulmonary:     Effort: Pulmonary effort is normal. No respiratory distress.  Abdominal:     General: There is no distension.     Tenderness: There is no abdominal tenderness.  Musculoskeletal:        General: Normal range of motion.     Cervical back: Normal range of motion.  Skin:    General: Skin is warm.     Findings: No rash.  Neurological:     General: No focal deficit present.     Mental Status: She is alert and oriented to person, place, and time. Mental status is at baseline.     Cranial Nerves: No cranial nerve deficit.     Sensory: No sensory deficit.     Motor: No weakness.     Coordination: Coordination normal.     Gait: Gait normal.     Deep Tendon Reflexes: Reflexes normal.  Psychiatric:  Mood and Affect: Mood normal.        Behavior: Behavior normal.        Thought Content: Thought content normal.        Judgment: Judgment normal.     ED Results / Procedures / Treatments   Labs (all labs ordered are listed, but only abnormal results are displayed) Labs Reviewed  BASIC METABOLIC PANEL - Abnormal; Notable for the following components:      Result Value   CO2 20 (*)    Glucose, Bld 111 (*)    All other components within normal limits  CBC - Abnormal; Notable for the following components:   WBC 12.7 (*)    All other components within normal limits  POC URINE PREG, ED  TROPONIN I (HIGH SENSITIVITY)  TROPONIN I (HIGH SENSITIVITY)    EKG EKG Interpretation Date/Time:  Wednesday October 02 2023 12:47:21 EST Ventricular Rate:  94 PR Interval:  126 QRS Duration:  74 QT Interval:  334 QTC  Calculation: 417 R Axis:   80  Text Interpretation: Normal sinus rhythm with sinus arrhythmia T wave abnormality, consider inferolateral ischemia Abnormal ECG When compared with ECG of 26-Mar-2022 12:12, T wave inversion now evident in Inferior leads T wave inversion now evident in Anterolateral leads Confirmed by UNCONFIRMED, DOCTOR ([id number]), editor Lonell Face ([id number]) on 10/02/2023 1:55:29 PM  Radiology DG Chest 2 View Result Date: 10/02/2023 CLINICAL DATA:  Numbness and tingling in the upper extremities today. Generalized weakness and fatigue. EXAM: CHEST - 2 VIEW COMPARISON:  Radiographs 03/26/2022 and 07/21/2020. FINDINGS: The heart size and mediastinal contours are normal. The lungs are clear. There is no pleural effusion or pneumothorax. No acute osseous findings are identified. IMPRESSION: No evidence of acute cardiopulmonary process. Electronically Signed   By: Carey Bullocks M.D.   On: 10/02/2023 14:35   CT HEAD WO CONTRAST ( ) Result Date: 10/02/2023 CLINICAL DATA:  Numbness EXAM: CT HEAD WITHOUT CONTRAST TECHNIQUE: Contiguous axial images were obtained from the base of the skull through the vertex without intravenous contrast. RADIATION DOSE REDUCTION: This exam was performed according to the departmental dose-optimization program which includes automated exposure control, adjustment of the mA and/or kV according to patient size and/or use of iterative reconstruction technique. COMPARISON:  03/26/2022 FINDINGS: Brain: No evidence of acute infarction, hemorrhage, mass, mass effect, or midline shift. No hydrocephalus or extra-axial fluid collection. Vascular: No hyperdense vessel. Skull: Negative for fracture or focal lesion. Sinuses/Orbits: Sequela of prior endoscopic sinus surgery. Mucosal thickening in the right maxillary sinus with bubbly fluid. Additional mucosal thickening in the remaining ethmoid air cells and inferior frontal sinuses. Other: The mastoid air cells are well aerated.  IMPRESSION: 1. No acute intracranial process. 2. Paranasal sinus disease, with bubbly fluid in the right maxillary sinus, which can be seen in the setting of acute sinusitis. Electronically Signed   By: Wiliam Ke M.D.   On: 10/02/2023 14:23    Procedures Procedures    Medications Ordered in ED Medications - No data to display  ED Course/ Medical Decision Making/ A&P                                 Medical Decision Making Amount and/or Complexity of Data Reviewed Labs: ordered. Radiology: ordered.     37 year old female with reports of waking up with numbness and tingling in her hands and feet earlier today.  Patient reports having anxiety/panic attack earlier today.  Symptoms have improved.  She has no neurological deficits on physical exam.  Overall strength neurological status appears well.  She had a negative CT scan of the head as well as normal blood work and negative cardiac workup.  CT scan did show maxillary sinusitis, will place on oral Augmentin for 10 days.  Patient is given strict return precautions to return if any worsening numbness tingling or weakness in the upper or lower extremities or any vision changes dizziness or lightheadedness.  Patient currently appears well and stable for discharge to home. Final Clinical Impression(s) / ED Diagnoses Final diagnoses:  Numbness and tingling in both hands  Panic attack  Acute non-recurrent maxillary sinusitis    Rx / DC Orders ED Discharge Orders          Ordered    amoxicillin-clavulanate (AUGMENTIN) 875-125 MG tablet  2 times daily        10/02/23 1640              Evon Slack, PA-C 10/02/23 1648    Merwyn Katos, MD 10/03/23 [phone]
